# Patient Record
Sex: Female | Born: 1955 | Race: Black or African American | Hispanic: No | Marital: Single | Smoking: Former smoker
Health system: Southern US, Community
[De-identification: ages and names within clinical notes are randomized; demographics above are authoritative.]

## PROBLEM LIST (undated history)

## (undated) DIAGNOSIS — J45909 Unspecified asthma, uncomplicated: Secondary | ICD-10-CM

## (undated) DIAGNOSIS — E119 Type 2 diabetes mellitus without complications: Secondary | ICD-10-CM

## (undated) DIAGNOSIS — I10 Essential (primary) hypertension: Secondary | ICD-10-CM

## (undated) HISTORY — PX: TUBAL LIGATION: SHX77

---

## 2001-02-15 ENCOUNTER — Emergency Department (HOSPITAL_COMMUNITY): Admission: EM | Admit: 2001-02-15 | Discharge: 2001-02-15 | Payer: Self-pay | Admitting: Emergency Medicine

## 2003-07-13 ENCOUNTER — Inpatient Hospital Stay (HOSPITAL_COMMUNITY): Admission: EM | Admit: 2003-07-13 | Discharge: 2003-07-17 | Payer: Self-pay | Admitting: Emergency Medicine

## 2004-08-16 ENCOUNTER — Emergency Department (HOSPITAL_COMMUNITY): Admission: EM | Admit: 2004-08-16 | Discharge: 2004-08-16 | Payer: Self-pay | Admitting: Emergency Medicine

## 2004-11-12 IMAGING — CR DG CHEST 1V PORT
1 series · 1 of 1 positions shown · non-contrast
Comparison: none

CLINICAL DATA: Hyperglycemia, altered level of consciousness.  
PORTABLE CHEST ONE VIEW at 0042 hours:  
  Normal sized heart, clear lungs, minimal scoliosis and moderate inferior acromioclavicular spur formation on the left.
IMPRESSION
No acute abnormality.

[view not recorded]
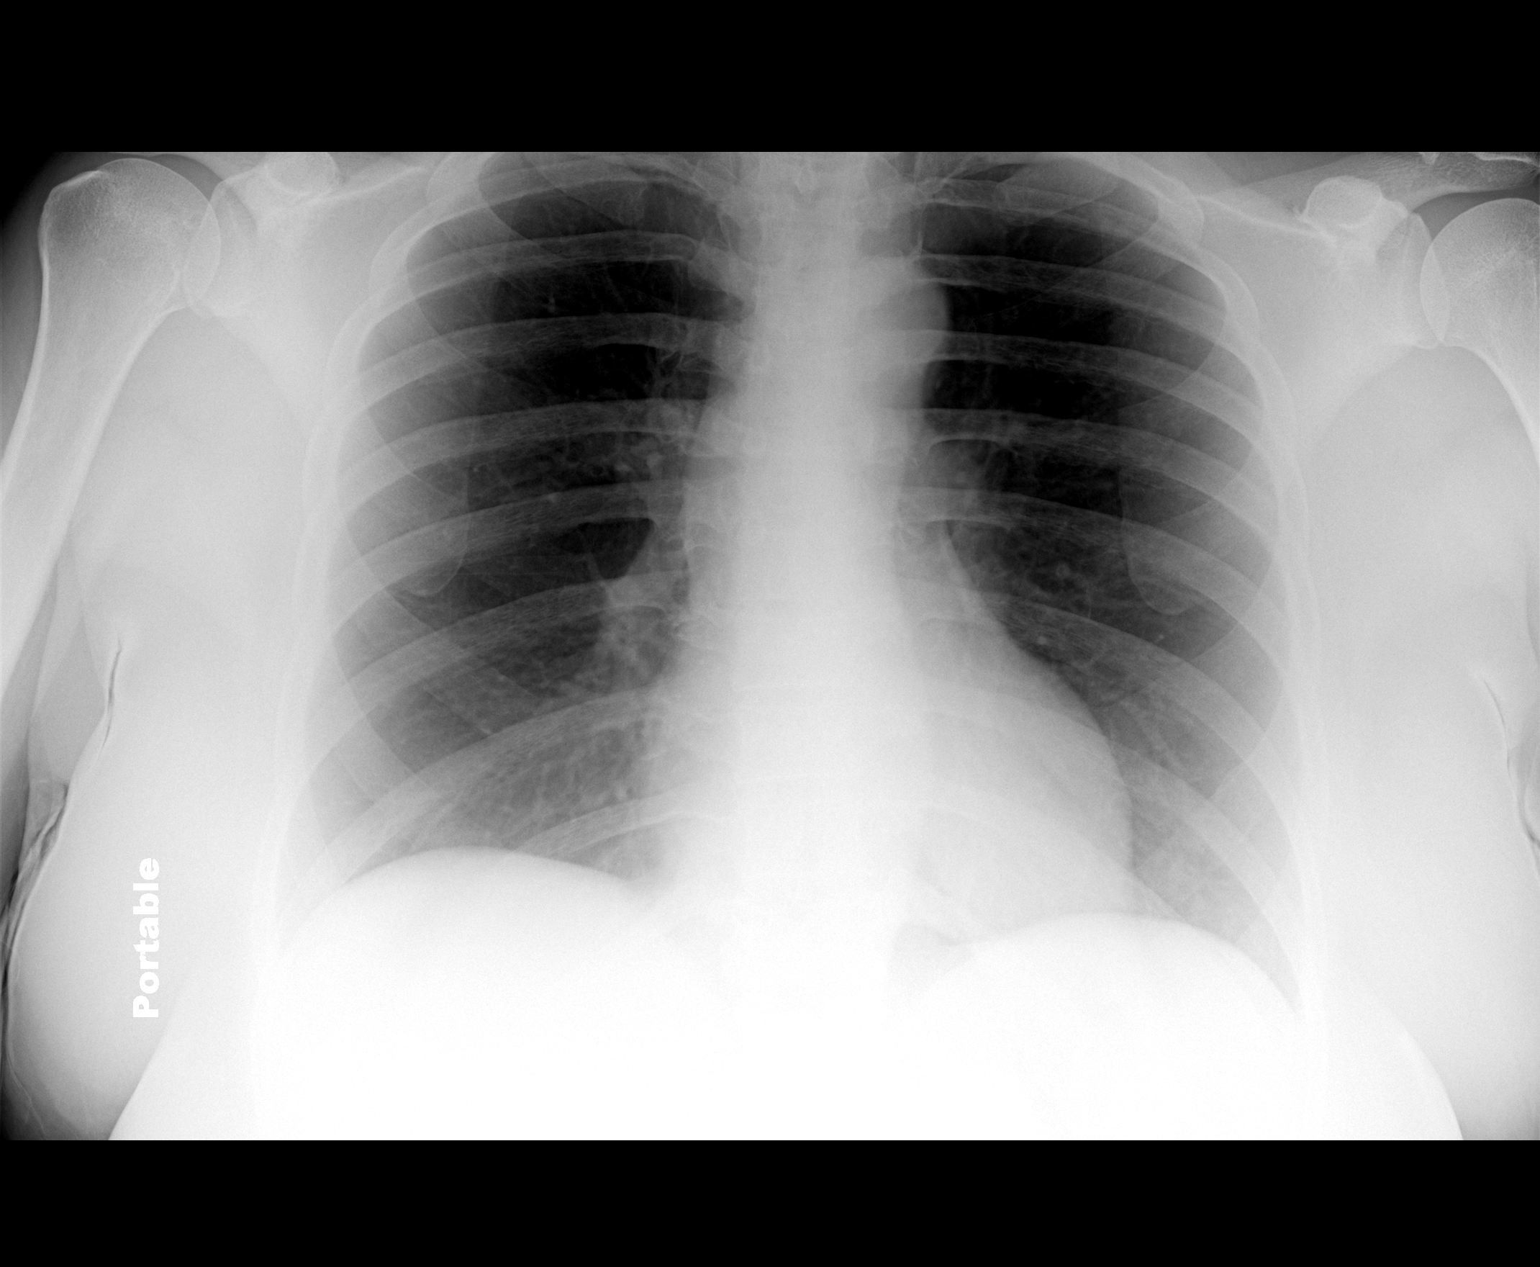

[1 of 1 positions shown; findings below may reference images not displayed]

## 2009-05-10 ENCOUNTER — Other Ambulatory Visit: Admission: RE | Admit: 2009-05-10 | Discharge: 2009-05-10 | Payer: Self-pay | Admitting: Unknown Physician Specialty

## 2010-10-13 NOTE — Discharge Summary (Signed)
NAME:  Lori Gates, Lori Gates                        ACCOUNT NO.:  [id number]   MEDICAL RECORD NO.:  [id number]                   PATIENT TYPE:  INP   LOCATION:  A201                                 FACILITY:  [facility]   PHYSICIAN:  Vania Rea, M.D.              DATE OF BIRTH:  [birth year]   DATE OF ADMISSION:  07/13/2003  DATE OF DISCHARGE:  07/15/2003                                 DISCHARGE SUMMARY   PRIMARY CARE PHYSICIAN:  [facility]   DISCHARGE DIAGNOSES:  1. Diabetes mellitus, uncontrolled.  2. Dehydration with acute renal insufficiency, resolved.  3. Atypical chest pain.  4. Abnormal EKG.  5. History of asthma.  6. Obesity.  7. Hyperlipidemia.   DISPOSITION:  Transferred to [facility] for cardiac  catheterization.   DISCHARGE CONDITION:  Stable.   DISCHARGE MEDICATIONS:  1. Aspirin 81 mg daily.  2. Lopressor 12.5 mg twice daily.  3. Lipitor 20 mg at bedtime.  4. Magnesium oxide 400 mg twice daily.  5. Metoprolol 25 mg twice daily.  6. K-Dur 40 mEq every 4 hours for 3 doses.  7. __________  500 mg p.o. 3 times daily.  8. Novolin 70/30, 35 units q.a.m. 18 units q.p.m.   HOSPITAL COURSE:  Please refer to history and physical of February 15.  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  Eventually she visited a nurse practitioner who prescribes  Glucophage.  The patient did not improve on Glucophage and eventually  returned to the nurse practitioner who advised visiting the emergency room  at [facility].  In the emergency room the patient was noted to be  hyperglycemic with blood sugar over 600.  However, her pH was within the  normal range.  She had ketonemia compatible with her chronic anorexic state.  She was assessed as not  being in ketoacidosis.  She was admitted to  concentrated care and telemetry and treated with vigorous IV fluid hydration  and a moderate dose of insulin with a goal of bringing her blood sugar down  slowly.  By the following day the patient was significantly improved.  She  was eating a normal diet and her blood sugars were now in the 300s.  Her  insulin was increased to bring her blood sugar down into the normal range  and her fasting blood sugar the following morning was 128.   On admission the patient described episodes of chest pain with activity in  going to the bathroom at night, but no chest pain with her normal activity  during the day.  Her cardiac enzymes were slightly elevated, CK and CK/MB  total and the patient had tenderness on the right side  of the chest on  springing the chest.  The pain was considered to be atypical for cardiac  pain, but because of her diabetes and a strong family history of diabetes  and the fact that she had a 3/6 cardiac murmur or unclear etiology a  cardiology consult was sought.   There was no clear reason found for the sudden onset of the patient's  diabetes.  There was no evidence of infection.  There was no evidence of  myocardial infarction by enzymes; and, in fact her hemoglobin A1c was 13.5  which suggested that her blood sugar had been out of control for quite a few  months.   Her fasting lipid profile revealed an LDL of 122.   The cardiology service was concerned that in this obese, African-American  lady with a strong family history of diabetes who was having chest pains on  exertion that unstable angina was a strong diagnosis and cardiac  catheterization was indicated.  She is being transferred to [facility] for a catheterization as soon as possible.  On the morning of  transfer her white count is 6.4. Her hematocrit is 36.1.  Her platelet count  is 234.  Her sodium 132, potassium 3.8, chloride 109, CO2 18, BUN 5,   creatinine 0.9, calcium 8.2, her glucose is 271.  The patient is very  comfortable, free from pain, free from nausea.  She is eating a full 1500  calorie diet as recommended by the dietician.  She is administering her own  glucose and has had diabetic teaching.   Further disposition as per [facility] physicians.     ___________________________________________                                         Vania Rea, M.D.   LC/MEDQ  D:  07/19/2003  T:  07/19/2003  Job:  [id number]

## 2010-10-13 NOTE — H&P (Signed)
NAME:  Lori Gates, Lori Gates                        ACCOUNT NO.:  [id number]   MEDICAL RECORD NO.:  [id number]                   PATIENT TYPE:  INP   LOCATION:  A201                                 FACILITY:  [facility]   PHYSICIAN:  Vania Rea, M.D.              DATE OF BIRTH:  [birth year]   DATE OF ADMISSION:  07/13/2003  DATE OF DISCHARGE:                                HISTORY & PHYSICAL   PRIMARY CARE PHYSICIAN:  Unassigned.   CHIEF COMPLAINT:  High blood sugar.   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  The  patient checked the meaning of this with other workers at the facility and  was reassured that the machine was not calibrated for her.  The patient had  progressive thirst, polyuria, and polydipsia associated with dysuria but  still tended to ignore the symptoms.  The patient also noted chest pain at  night when she got up to go to the bathroom but did not notice chest pain  when involved in activity during the day.  The chest pain at night was  relieved by rest and brought on by exertion.  She did nothing actively to  relieve the pain.  Eventually, about a week ago the patient visited the  public health facility, was found to have elevated blood sugar and started  on Glucophage 500 mg twice daily.  At that time, the patient said she  weighed 221 pounds.  After starting Glucophage, the patient's symptoms of  dizziness on standing, dysuria, polyuria and polydipsia persisted.  The  patient now also started to experience fevers and coughs, nausea and  vomiting and continued to loose weight.  The patient returned to the health  facility today and was sent into the emergency room for admission.   PAST MEDICAL HISTORY:  Significant  for:  1. Asthma.  2. Tubal ligation 25 years ago.   MEDICATIONS:  1. Glucophage 500 mg twice a day for the past week.  2. Albuterol inhaler uses about twice per month.   ALLERGIES:  No known drug allergies.   SOCIAL HISTORY:  Smokes one pack of tobacco per week for the past six years.  Denies the use of alcohol or illicit drugs.  She has one 22 year old  daughter who suffers bronchitis and sinusitis.  She has four siblings all of  whom have hypertension and diabetes.  One of them died as a result of  complications of diabetes, hypertension and CHF.  Her mother died at age 43  from metastatic cancer, was also diabetic.  Her father died at age 61 of  complications of diabetes and cirrhosis of the liver.  She lives on  her own  with her daughter and granddaughter.   REVIEW OF SYSTEMS:  Significant only additionally for headaches since  starting Glucophage.   PHYSICAL EXAMINATION:  GENERAL:  An obese, middle-aged African American lady  lying on the stretcher.  VITAL SIGNS:  Her temperature is 97.6, her pulse is 70, respirations 20,  blood pressure has ranged, since being in the emergency room, from a  systolic in the 90s, currently 122/109.  Her capillary blood sugar, in the  emergency room, is registering over the chart.  She has been started on IV  fluids.  Her height is 5 foot 5 inches, and her weight is 187 pounds.  She  is saturating at 97% on room air.  HEENT:  She is dry.  She is pink and anicteric.  Her pupils are equal and  reactive to light.  There is no lymphadenopathy.  There is no jugular venous  distention.  CHEST:  Clear to auscultation bilaterally.  She has tenderness of the right  side of the chest on springing  the sternum.  CARDIOVASCULAR:  Regular rhythm.  She has a 3/6 systolic murmur in the left  upper sternal border.  There are no gallops.  ABDOMEN:  Obese, soft and nontender.  She has normal abdominal bowel sounds.  EXTREMITIES:  She has no edema and 3+  pulses.  She has a rash on her right  shin which she says is the result of an old injury.  CNS:  She is alert and oriented  x 3.  There are no focal deficits.   LABS:  Her white count is 6.9, her hematocrit is 48.2, hemoglobin is 16.5,  platelet count is 350.  She has 70% neutrophils.  In her CMP, sodium is 126,  potassium 4.2, chloride 88, CO2 22 and glucose 622.  BUN is 24, creatinine  1.8.  Liver related enzymes are all normal.  Her total protein is 8.5,  albumin 4.3, calcium 9.9.  Serum osmolality is 326, slightly elevated.  Urinalysis shows over 1,000 glucose and over 80 ketones.  Specific gravity  is 1.003, pH is 5.  It is otherwise negative.  Her ABG is significant for a  pH of 7.35, pCO2 33, pO2 90, saturation 97.5% on room air.  The first set of  cardiac enzymes CK total is 301, CK-MB 4.2, and the troponin is 0.03.   EKG shows normal sinus rhythm with AVL being over being over 11 mm, probably  left ventricular hypertrophy with T wave inversions in V3-V6 but not deep T  wave inversions.  No other ST segment changes suggestive of ischemia.   Chest x-ray shows no acute abnormality.   ASSESSMENT:  1. Newly diagnosed diabetes mellitus.  2. Dehydration with acute renal insufficiency.  3. Unstable angina.  4. History of asthma.   PLAN:  Since the patient does not seem to be in DKA but merely has very high  blood glucose, we will admit her.  We will hydrate her vigorously.  We will  start her on twice daily 70/30 Novolin as well as sliding scale insulin for  the next 24 hours.  We will continue to do serial cardiac enzymes and EKGs.  We will culture her blood and urine.  We will check her TSH and lipid panel.  We will institute diabetic teaching, teach her how to give herself insulin.  Get an echo of the heart because of the cardiac murmurs.  Assess for  valvular disease and get a cardiology consult.  We are expecting this admission to be no more than about three days when her  blood sugar is in a  normal range, we will send her home on insulin and assign her to a primary  care physician who may be able to arrange for ophthalmologic followup,  podiatry followup, cardiology followup, and may transition her to oral  medication if indicated.     ___________________________________________                                         Vania Rea, M.D.   LC/MEDQ  D:  07/13/2003  T:  07/13/2003  Job:  [id number]

## 2010-10-13 NOTE — Consult Note (Signed)
NAME:  Lori Gates, Lori Gates                        ACCOUNT NO.:  [id number]   MEDICAL RECORD NO.:  [id number]                   PATIENT TYPE:  INP   LOCATION:  A201                                 FACILITY:  [facility]   PHYSICIAN:  Vida Roller, M.D.                DATE OF BIRTH:  [birth year]   DATE OF CONSULTATION:  07/14/2003  DATE OF DISCHARGE:                                   CONSULTATION   PRIMARY CARE Lori Gates:  Vertis Kelch at the health department at [facility].   CARDIOLOGIST:  None.   HISTORY OF PRESENT ILLNESS:  Lori Gates is a 55 year old female with no  known coronary artery disease and a past medical history only for asthma who  was found approximately a week ago to have new-onset diabetes.  She  presented to her primary care Lori Gates with polyuria, polydipsia, elevated  glucose, was started on Glucophage at that time, and reported progressive  worsening symptoms with nausea, vomiting, chest pain, shortness of breath,  dyspnea on exertion over the course of the next week, and was admitted last  night with significant acid-base disturbance and hyperglycemia.  The patient  reports that she has had left anterior chest tightness with exertion and  associated shortness of breath for about a week-and-a-half to 2 weeks.  She  states that whenever she exerts herself she has discomfort in the left  center of her chest which is associated with a squeezing sensation and  shortness of breath.  When she tries to walk through the discomfort it gets  worse; when she stops it gets better.  She denies any PND or orthopnea and  there is no obvious lower extremity edema.  She has had no palpitations, no  diaphoresis.  Currently she is without chest discomfort and has not had  chest discomfort for about 12 hours.   PAST MEDICAL HISTORY:  Significant for asthma for which she takes albuterol  nebulizers on a p.r.n. basis and no other medication.  She had a bilateral  tubal ligation about  25 years ago, and the diabetes.   MEDICATIONS:  Medications prior to admission are Glucophage 500 b.i.d. which  she has taken for about a week, and the albuterol nebulizers p.r.n.  She is  not allergic to any medications.   Here in the hospital she is on:  1. Diflucan 100 mg p.o. daily.  2. Insulin 70/30 she gets 30 units in the a.m. and 15 units in the p.m.     subcu.  3. She is also on a sliding scale regular insulin.  4. She is getting IV normal saline with potassium chloride at 100 mL/hour.  5. Albuterol and Atrovent nebulizers on a p.r.n. basis for her shortness of     breath.   SOCIAL HISTORY:  She lives in [place] with her daughter and her  grandchildren.  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  She is  divorced.  She has one 31 year old daughter who is healthy, two  grandchildren who are healthy.  She has got about a 6 pack-year smoking  history.  She has been smoking for quite a few years.  She smokes about a  pack a week due to financial situation.  She does not use any alcohol and  there are no illicit drugs.   FAMILY HISTORY:  Her mother died at age 57 of diabetes and metastatic  cancer.  Her father died at age 36 of diabetes and cirrhosis.  She has one  sister who died at a relatively early age of diabetes, hypertension, and  heart failure.  She has one sister who has hypertension and diabetes.   REVIEW OF SYSTEMS:  She has subjective fevers though no obvious findings.  She has significant blurred vision, significant vertigo with shortness of  breath and dizziness when she stands.  This has persisted during her  hospitalization.  She has the chest pain and dyspnea on exertion previously  described.  She tells me she has a vaginal discharge which is bothersome to  her which is why she is, I believe, on the Diflucan, but no dysuria.  She  has had persistent nausea and vomiting over the course of last week, some  constipation, and the polydipsia and  polyuria as previously described.  The  remainder of her review of systems is negative.   PHYSICAL EXAMINATION:  GENERAL:  She is a well-developed, moderately-obese  African-American female who is really in mild distress secondary to her  dizziness and shortness of breath.  VITAL SIGNS:  Her temperature is 96.5, her pulse is 68, her respiratory rate  is 20, her blood pressure is 125/82, and her most recent blood glucose is  394 - fingerstick.  HEENT:  Unremarkable.  She does have relatively poor dentition.  She has no  jugular venous distention or carotid bruits noted.  CARDIOVASCULAR:  She has a nondisplaced point of maximal impulse.  First and  second heart sounds are normal.  There is a 2/6 systolic murmur that is  heard best at the upper right sternal border.  She has no diastolic murmurs  and no extra sounds.  LUNGS:  She has decreased breath sounds at the bases but otherwise  reasonably good air movement.  BREASTS:  Her complete breast exam was deferred.  SKIN:  Without any significant lesions.  ABDOMEN:  Soft, nontender.  She has normal active bowel sounds.  No  hepatosplenomegaly noted although her abdomen is obese.  EXTREMITIES:  Without clubbing, cyanosis, or edema.  GENITOURINARY AND RECTAL:  Deferred.  MUSCULOSKELETAL:  Otherwise normal.  NEUROLOGIC:  Generally intact.  I did not do a full neurologic exam.   Her chest x-ray shows no acute abnormality.  Her electrocardiogram is  concerning.  She has sinus rhythm at a rate of 70 with a mild left axis  deviation.  Her intervals are all normal.  She does not have any obvious Q  waves but she has inverted T waves in leads V4-V6 and II, III, and aVF with  minimal voltage criteria for left ventricular hypertrophy.  Laboratories:  Her white blood cell count is 6.9 with H&H of 16 and 48, platelet count of  350.  Her sodium is 130, potassium is 5.5, her chloride is 100 with a bicarbonate of 23, BUN of 16, creatinine of 1.0, her  blood sugar is 423 this  morning.  Liver function  studies are all within normal limits with the  exception of a total protein of 8.5 and albumin of 4.3 - both of which are  elevated.  She has three sets of cardiac enzymes which are not consistent  with acute myocardial infarction.  She has moderate acetone in her blood.  Her serum osmolality is 326.  Her urinalysis shows a pH of 5.0 with a  specific gravity of 1.003 with 3-6 white blood cells.  She has greater than  1000 glucose in her urine and greater than 80 ketones in her urine.  Her  blood gas shows a pH of 7.35, PCO2 of 33, PO2 of 90, with a calculated  bicarbonate of 17, 97% saturation.   So, this is a lady with a relatively significant diabetic acid-base  disturbance which I will defer management to Dr. Orvan Falconer after a lengthy  discussion regarding this.  She does have an acute coronary syndrome.  Her  enzymes are negative x3 and her symptoms have pretty much resolved, but her  EKG is abnormal and I believe with her relatively impressive presentation  that she probably benefits from an invasive assessment.  We will get an  echocardiogram today and her invasive assessment probably needs to be done  once her acid-base status is more stabilized.  With the serum osmolality  elevated and the evidence of relatively significant acidosis - ketoacidosis  in my opinion - it would probably not be in her best interests to undergo an  invasive assessment with the potential catastrophic complications associated  with that at this point when she is relatively stable.  I would recommend, I  think, addition of a low dose of aspirin and a beta blocker in her  situation.  Those would be very reasonable things.  She will need to be  placed on telemetry as she was not over the course of the last 24 hours.  I  agree with replacing her potassium.  I would also check a magnesium level to  ensure that that is not significantly depleted as that is  associated with  some cardiac arrhythmias.  Will follow her along relatively aggressively  here in the hospital.  If she develops any more discomfort in her chest I  would recommend that she be anticoagulated with unfractionated heparin,  serious consideration be made for using a IIb/IIIa inhibitor, that she be  relatively aggressively managed for her acid-base status, and potentially  moved emergently to [facility] for an invasive procedure.      ___________________________________________                                            Vida Roller, M.D.   JH/MEDQ  D:  07/14/2003  T:  07/14/2003  Job:  [id number]

## 2010-10-13 NOTE — Discharge Summary (Signed)
NAME:  Lori Gates, Lori Gates                        ACCOUNT NO.:  [id number]   MEDICAL RECORD NO.:  [id number]                   PATIENT TYPE:  INP   LOCATION:  4736                                 FACILITY:  [facility]   PHYSICIAN:  Olga Millers, M.D.                DATE OF BIRTH:  Oct 12, 1955   DATE OF ADMISSION:  07/15/2003  DATE OF DISCHARGE:  07/17/2003                                 DISCHARGE SUMMARY   HISTORY OF PRESENT ILLNESS:  This is a 55 year old black female with no  known coronary artery disease and past medical history for asthma and new  onset diabetes, reported worsening symptoms of nausea, vomiting, chest pain,  and dyspnea on exertion. She reported left anterior chest tightness with  exertion and with associated shortness of breath.  The pain was relieved  with rest.  The patient reports increased chest pain over the last week.  She was admitted to [facility] to evaluate the above symptoms.   PAST MEDICAL HISTORY:  1. Diabetes diagnosed approximately one week ago.  The patient relates blood     sugars were between 400 and 500 levels. She was on p.o. Glucophage for     this>  2. History of asthma.  3. Status post tubal ligation 25 years ago.   ALLERGIES:  No known drug allergies.   SOCIAL HISTORY:  The patient lives in [place].  She is divorced x 2.  Lives with her daughter and grandchildren.  She is a Lawyer in [place].  She  denies history of alcohol use. She does smoke approximately one pack per day  for the past two years.  She denies any exercise.   FAMILY HISTORY:  Mother deceased at 56, diabetes, secondary to metastatic  cancer.  Father deceased at age 58, diabetes and cirrhosis.  Siblings  positive history of diabetes and hypertension.   HOSPITAL COURSE:  As noted, the patient was admitted to [facility] for  further evaluation of her chest pain.  She was transported here from  [place].  While in [facility], initial CK was slightly elevated at 301,  CK-MB 4.1,  troponin negative.  Urine was negative.  CMET showed a blood  glucose of 6.2.  CBC was unremarkable.  She decided to be transferred to  [facility].  While at [facility], echocardiogram was checked which showed  left ventricle normal size, concentric left ventricular hypertrophy,  70  to  55% EF.  Valves were unremarkable.  It was decided she would be transferred  to [facility] for cardiac catheterization.   Cardiac catheterization on July 16, 2003, per Dr. Riley Kill showed normal  EF, no obstructive coronary artery disease.  Chest pain was not related to  coronary artery disease.  D-dimer was added which was negative.  It was  decided she would be sent home on insulin and to follow up with [facility] Department until she gets primary care  and good followup.   LABORATORY DATA:  CBC on discharge showed a white count of 6.2, hemoglobin  12.4, hematocrit 36.0, platelets 249.  Sodium 135, potassium 4.0, chloride4  101, CO2 29, glucose 236, BUN 5, creatinine 1.0.  Calcium was 8.5.  Thyroid  was checked and was 2.304.  Cholesterol showed 202 total, triglycerides 260,  HDL 28, LDL 122.   Chest x-ray showed no acute abnormalities.   Blood gas showed a pH of 7.35, PCO2 low at 33, bicarb low at 17.7, PO2 was  90.  __________ was 6.4.  PTT 30, INR 0.9, pro time 12.3.  D-dimer was 0.36.  Her A1C was 13.5.   DISCHARGE MEDICATIONS:  1. The patient was told to begin enteric-coated aspirin 81 mg 1 p.o. daily.  2. She was to maintain on Humulin 70/30, 35 units q.a.m., 18 q.p.m.  The     patient was given a viol at the hospital.  3. She is to follow up with [facility] Department for probable     addition of an ACE inhibitor.   Stressed to patient the importance of blood sugar control.   Advised patient that cardiovascular risk is linked to uncontrolled diabetes.  She is in agreement.  She does have an appointment with [facility]  Department at 8:45 on Tuesday, July 20, 2003.   She was advised no  driving x 2 days, check blood sugars twice daily and take to appointment,  monitor the groin site for swelling and pain.  She is to follow a low-sugar,  low-fat diet and use Tylenol p.r.n..   DISCHARGE DIAGNOSES:  1. Admission secondary to chest pain.  No coronary artery disease per     catheterization July 16, 2003.  2. Diabetes, poor control.  A1C 13.5, initiated on insulin.  3. Asthma.      [place], [place]                       Olga Millers, M.D.    MP/MEDQ  D:  07/17/2003  T:  07/18/2003  Job:  [id number]

## 2010-10-13 NOTE — Cardiovascular Report (Signed)
NAME:  Lori Gates, Lori Gates                        ACCOUNT NO.:  [id number]   MEDICAL RECORD NO.:  [id number]                   PATIENT TYPE:  INP   LOCATION:  4736                                 FACILITY:  [facility]   PHYSICIAN:  Arturo Morton. Riley Kill, M.D.             DATE OF BIRTH:  [birth year]   DATE OF PROCEDURE:  07/16/2003  DATE OF DISCHARGE:                              CARDIAC CATHETERIZATION   INDICATIONS:  Ms. Nowland is a 55 year old woman who presented with out of  control diabetes.  She also had fairly impressive chest pain.  There were T-  wave inversions in the anterolateral leads.  The current was done to assess  coronary anatomy.   PROCEDURES:  1. Left heart catheterization.  2. Selective coronary arteriography.  3. Selective left ventriculography.   DESCRIPTION OF PROCEDURE:  The procedure was performed from the right  femoral artery using 6 French catheters.  She tolerated the procedure  without complication and was taken to the holding area in satisfactory  clinical condition.  Informed consent had been provided by Dr. Dionicio Stall.   HEMODYNAMIC DATA:  1. Central aorta:  114/79, mean 96.  2. Left ventricle:  115/13.  3. No gradient on pullback across the aortic valve.   ANGIOGRAPHIC DATA:  1. Ventriculography was performed in the RAO projection.  Overall systolic     function was well preserved.  No definite wall motion abnormalities were     seen.  Ejection fraction was in excess of 55%.  2. The left main coronary was long and without significant focal narrowing.  3. The left anterior descending artery coursed in the apex and provided the     blood supply to most of the inferior wall.  There was major diagonal     branch which bifurcated as well.  The LAD and diagonal were free of     critical disease.  4. There is a small ramus intermedius that is free of critical disease.  5. There is an AV circumflex that provides two major marginal branches and     is free of  disease.  6. The right coronary artery is a smaller caliber vessel that provides a     tiny PDA and posterior lateral system. No obvious high grade areas of     focal abnormality are seen.   CONCLUSIONS:  1. Well preserved left ventricular function.  2. No definite coronary abnormalities.   PLAN:  1. Diabetes coordinator.  2. Check D-dimer.  Consider CT scan.  3. The patient will need primary care which may be difficult to arrange.                                               Arturo Morton. Riley Kill, M.D.    TDS/MEDQ  D:  07/16/2003  T:  07/16/2003  Job:  [id number]   cc:   Vida Roller, M.D.  Fax: [phone]

## 2010-10-13 NOTE — Procedures (Signed)
NAME:  Lori Gates, Lori Gates                        ACCOUNT NO.:  [id number]   MEDICAL RECORD NO.:  [id number]                   PATIENT TYPE:  INP   LOCATION:  A201                                 FACILITY:  [facility]   PHYSICIAN:  Vida Roller, M.D.                DATE OF BIRTH:  07-21-1955   DATE OF PROCEDURE:  07/14/2003  DATE OF DISCHARGE:                                  ECHOCARDIOGRAM   TAPE NUMBER:  LB [id number], tape count 952 through 1441.   INDICATIONS:  This is a 55 year old woman with diabetic ketoacidosis and  unstable angina, with an abnormal electrocardiogram for left ventricular  systolic function.   TECHNICAL QUALITY:  The technical quality of the study is good.   M-MODE TRACINGS:  1. The aorta is 30 mm.  2. The left atrium is 34 mm.  3. The septum is 12 mm.  4. The posterior wall is 12 mm.  5. The left ventricular diastolic dimension is 35 mm.  6. The left ventricular systolic dimension is 18.   2-D AND DOPPLER IMAGING:  1. The left ventricle is normal size with mild, concentric left ventricular     hypertrophy.  The systolic function is hyperdynamic with an estimated     ejection fraction of 70 to 75%.  There were no obvious wall motion     abnormalities seen.  Diastolic function was assessed and appears to be     normal.  2. The right ventricle is top normal in size, but otherwise shows normal     systolic function.  No wall motion abnormalities are seen.  3. Both atria appear to be normal size.  4. The aortic valve is mildly sclerotic with no stenosis or regurgitation.  5. The mitral valve is morphologically unremarkable with no stenosis or     regurgitation.  6. The tricuspid valve is morphologically unremarkable with trivial     insufficiency.  No stenosis is seen.  7. The pulmonic valve appears to be morphologically unremarkable with     trivial insufficiency.  No stenosis is seen.  8. Pericardial strictures appear normal.  9. The inferior vena cava was not well  seen.  10.      The ascending aorta was not well seen.      ___________________________________________                                            Vida Roller, M.D.   JH/MEDQ  D:  07/14/2003  T:  07/15/2003  Job:  [id number]

## 2012-05-06 ENCOUNTER — Encounter (HOSPITAL_COMMUNITY): Payer: Self-pay | Admitting: *Deleted

## 2012-05-06 ENCOUNTER — Emergency Department (HOSPITAL_COMMUNITY)
Admission: EM | Admit: 2012-05-06 | Discharge: 2012-05-06 | Disposition: A | Discharge status: Home / Self Care | Payer: Self-pay | Attending: Emergency Medicine | Admitting: Emergency Medicine

## 2012-05-06 ENCOUNTER — Emergency Department (HOSPITAL_COMMUNITY): Payer: Self-pay

## 2012-05-06 DIAGNOSIS — J029 Acute pharyngitis, unspecified: Secondary | ICD-10-CM | POA: Insufficient documentation

## 2012-05-06 DIAGNOSIS — J45909 Unspecified asthma, uncomplicated: Secondary | ICD-10-CM | POA: Insufficient documentation

## 2012-05-06 DIAGNOSIS — R059 Cough, unspecified: Secondary | ICD-10-CM | POA: Insufficient documentation

## 2012-05-06 DIAGNOSIS — I10 Essential (primary) hypertension: Secondary | ICD-10-CM | POA: Insufficient documentation

## 2012-05-06 DIAGNOSIS — R6883 Chills (without fever): Secondary | ICD-10-CM | POA: Insufficient documentation

## 2012-05-06 DIAGNOSIS — E876 Hypokalemia: Secondary | ICD-10-CM | POA: Insufficient documentation

## 2012-05-06 DIAGNOSIS — R112 Nausea with vomiting, unspecified: Secondary | ICD-10-CM | POA: Insufficient documentation

## 2012-05-06 DIAGNOSIS — E119 Type 2 diabetes mellitus without complications: Secondary | ICD-10-CM | POA: Insufficient documentation

## 2012-05-06 DIAGNOSIS — J3489 Other specified disorders of nose and nasal sinuses: Secondary | ICD-10-CM | POA: Insufficient documentation

## 2012-05-06 DIAGNOSIS — R05 Cough: Secondary | ICD-10-CM | POA: Insufficient documentation

## 2012-05-06 DIAGNOSIS — B9789 Other viral agents as the cause of diseases classified elsewhere: Secondary | ICD-10-CM | POA: Insufficient documentation

## 2012-05-06 DIAGNOSIS — B349 Viral infection, unspecified: Secondary | ICD-10-CM

## 2012-05-06 HISTORY — DX: Unspecified asthma, uncomplicated: J45.909

## 2012-05-06 HISTORY — DX: Type 2 diabetes mellitus without complications: E11.9

## 2012-05-06 HISTORY — DX: Essential (primary) hypertension: I10

## 2012-05-06 LAB — CBC WITH DIFFERENTIAL/PLATELET
Eosinophils Relative: 1 % (ref 0–5)
Lymphocytes Relative: 42 % (ref 12–46)
Lymphs Abs: 3.6 10*3/uL (ref 0.7–4.0)
MCV: 87.4 fL (ref 78.0–100.0)
Platelets: 301 10*3/uL (ref 150–400)
RBC: 4.83 MIL/uL (ref 3.87–5.11)
WBC: 8.6 10*3/uL (ref 4.0–10.5)

## 2012-05-06 LAB — COMPREHENSIVE METABOLIC PANEL
ALT: 9 U/L (ref 0–35)
Alkaline Phosphatase: 76 U/L (ref 39–117)
CO2: 26 mEq/L (ref 19–32)
Calcium: 10 mg/dL (ref 8.4–10.5)
GFR calc Af Amer: 72 mL/min — ABNORMAL LOW (ref >90)
GFR calc non Af Amer: 63 mL/min — ABNORMAL LOW (ref >90)
Glucose, Bld: 105 mg/dL — ABNORMAL HIGH (ref 70–99)
Potassium: 3.1 mEq/L — ABNORMAL LOW (ref 3.5–5.1)
Sodium: 138 mEq/L (ref 135–145)
Total Bilirubin: 0.4 mg/dL (ref 0.3–1.2)

## 2012-05-06 MED ORDER — ONDANSETRON HCL 4 MG PO TABS: 4.0000 mg | ORAL_TABLET | Freq: Three times a day (TID) | ORAL | Status: AC | PRN

## 2012-05-06 MED ORDER — OSELTAMIVIR PHOSPHATE 75 MG PO CAPS: 75.0000 mg | ORAL_CAPSULE | Freq: Two times a day (BID) | ORAL | Status: DC

## 2012-05-06 MED ORDER — ONDANSETRON HCL 4 MG/2ML IJ SOLN
4.0000 mg | Freq: Once | INTRAMUSCULAR | Status: AC
  Administered 2012-05-06: 4 mg via INTRAVENOUS
  Filled 2012-05-06: qty 2

## 2012-05-06 MED ORDER — POTASSIUM CHLORIDE 20 MEQ/15ML (10%) PO LIQD
40.0000 meq | Freq: Every day | ORAL | Status: DC
  Administered 2012-05-06: 40 meq via ORAL
  Filled 2012-05-06: qty 30

## 2012-05-06 MED ORDER — SODIUM CHLORIDE 0.9 % IV BOLUS (SEPSIS)
1000.0000 mL | Freq: Once | INTRAVENOUS | Status: AC
  Administered 2012-05-06: 1000 mL via INTRAVENOUS

## 2012-05-06 MED ORDER — OSELTAMIVIR PHOSPHATE 75 MG PO CAPS
75.0000 mg | ORAL_CAPSULE | Freq: Once | ORAL | Status: AC
  Administered 2012-05-06: 75 mg via ORAL
  Filled 2012-05-06: qty 1

## 2012-05-06 MED ORDER — FAMOTIDINE IN NACL 20-0.9 MG/50ML-% IV SOLN
20.0000 mg | Freq: Once | INTRAVENOUS | Status: AC
  Administered 2012-05-06: 20 mg via INTRAVENOUS
  Filled 2012-05-06: qty 50

## 2012-05-06 NOTE — ED Provider Notes (Signed)
History     CSN: [id number]  Arrival date & time 05/06/12  1845   First MD Initiated Contact with Patient 05/06/12 2021      Chief Complaint  Patient presents with  . URI  . N/V     (Consider location/radiation/quality/duration/timing/severity/associated sxs/prior treatment) HPI  Lori Gates is a 56 y.o. female with past medical history significant for diabetes, hypertension and asthma complaining of nausea/vomiting, sore throat, cough and congestion with chills x1day. Denies change in bowel or bladder habits, chest pain, shortness of breath, abdominal pain.   Past Medical History  Diagnosis Date  . Diabetes mellitus without complication   . Hypertension   . Asthma     History reviewed. No pertinent past surgical history.  History reviewed. No pertinent family history.  History  Substance Use Topics  . Smoking status: Not on file  . Smokeless tobacco: Not on file  . Alcohol Use:     OB History    Grav Para Term Preterm Abortions TAB SAB Ect Mult Living                  Review of Systems  Constitutional: Positive for chills. Negative for fever.  HENT: Positive for congestion and sore throat.   Respiratory: Positive for cough. Negative for shortness of breath.   Cardiovascular: Negative for chest pain.  Gastrointestinal: Positive for nausea and vomiting. Negative for abdominal pain and diarrhea.  All other systems reviewed and are negative.    Allergies  Review of patient's allergies indicates no known allergies.  Home Medications  No current outpatient prescriptions on file.  BP 140/85  Pulse 64  Temp 98.7 F (37.1 C) (Oral)  Resp 20  SpO2 100%  Physical Exam  Nursing note and vitals reviewed. Constitutional: She is oriented to person, place, and time. She appears well-developed and well-nourished. No distress.  HENT:  Head: Normocephalic.  Mouth/Throat: Oropharynx is clear and moist.  Eyes: Conjunctivae normal and EOM are normal. Pupils  are equal, round, and reactive to light.  Neck: Normal range of motion.  Cardiovascular: Normal rate, regular rhythm and intact distal pulses.   Pulmonary/Chest: Effort normal and breath sounds normal. No stridor. No respiratory distress. She has no wheezes. She has no rales. She exhibits no tenderness.  Abdominal: Soft. Bowel sounds are normal. She exhibits no distension and no mass. There is no tenderness. There is no rebound and no guarding.  Musculoskeletal: Normal range of motion.  Neurological: She is alert and oriented to person, place, and time.  Psychiatric: She has a normal mood and affect.    ED Course  Procedures (including critical care time)  Labs Reviewed  COMPREHENSIVE METABOLIC PANEL - Abnormal; Notable for the following:    Potassium 3.1 (*)     Glucose, Bld 105 (*)     GFR calc non Af Amer 63 (*)     GFR calc Af Amer 72 (*)     All other components within normal limits  CBC WITH DIFFERENTIAL  LIPASE, BLOOD   Dg Chest 2 View  05/06/2012  *RADIOLOGY REPORT*  Clinical Data: Dyspnea, wheezing, history asthma, hypertension, diabetes  CHEST - 2 VIEW  Comparison: 07/13/2003  Findings: Normal heart size and pulmonary vascularity. Calcified tortuous aorta. Lungs clear. No pleural effusion or pneumothorax. Bones unremarkable.  IMPRESSION: No acute abnormalities.   Original Report Authenticated By: Ulyses Southward, M.D.    10:16 PM patient is tolerating by mouth and feels much better.  1. Hypokalemia  2. Viral syndrome       MDM  Insulin-dependent diabetic asthmatic with flulike symptoms starting 24 hours ago. Plan is to hydrate, control nausea and start her on Tamiflu.   Pt verbalized understanding and agrees with care plan. Outpatient follow-up and return precautions given.    New Prescriptions   ONDANSETRON (ZOFRAN) 4 MG TABLET    Take 1 tablet (4 mg total) by mouth every 8 (eight) hours as needed for nausea.   OSELTAMIVIR (TAMIFLU) 75 MG CAPSULE    Take 1 capsule (75  mg total) by mouth every 12 (twelve) hours.          Wynetta Emery, PA-C 05/06/12 2217

## 2012-05-06 NOTE — ED Notes (Signed)
Pt in c/o sore throat, cough and congestion, also n/v x1 day. Pt unsure of fever at home. Pt speaking in full sentences, no distress noted.

## 2012-05-07 NOTE — ED Provider Notes (Signed)
Medical screening examination/treatment/procedure(s) were performed by non-physician practitioner and as supervising physician I was immediately available for consultation/collaboration.  Totiana Everson T Mauriah Mcmillen, MD 05/07/12 1558 

## 2013-09-06 IMAGING — CR DG CHEST 2V
2 series · 2 of 2 positions shown · non-contrast
Comparison: 07/13/2003

CLINICAL DATA: Dyspnea, wheezing, history asthma, hypertension,
diabetes

CHEST - 2 VIEW

[w chest pa]
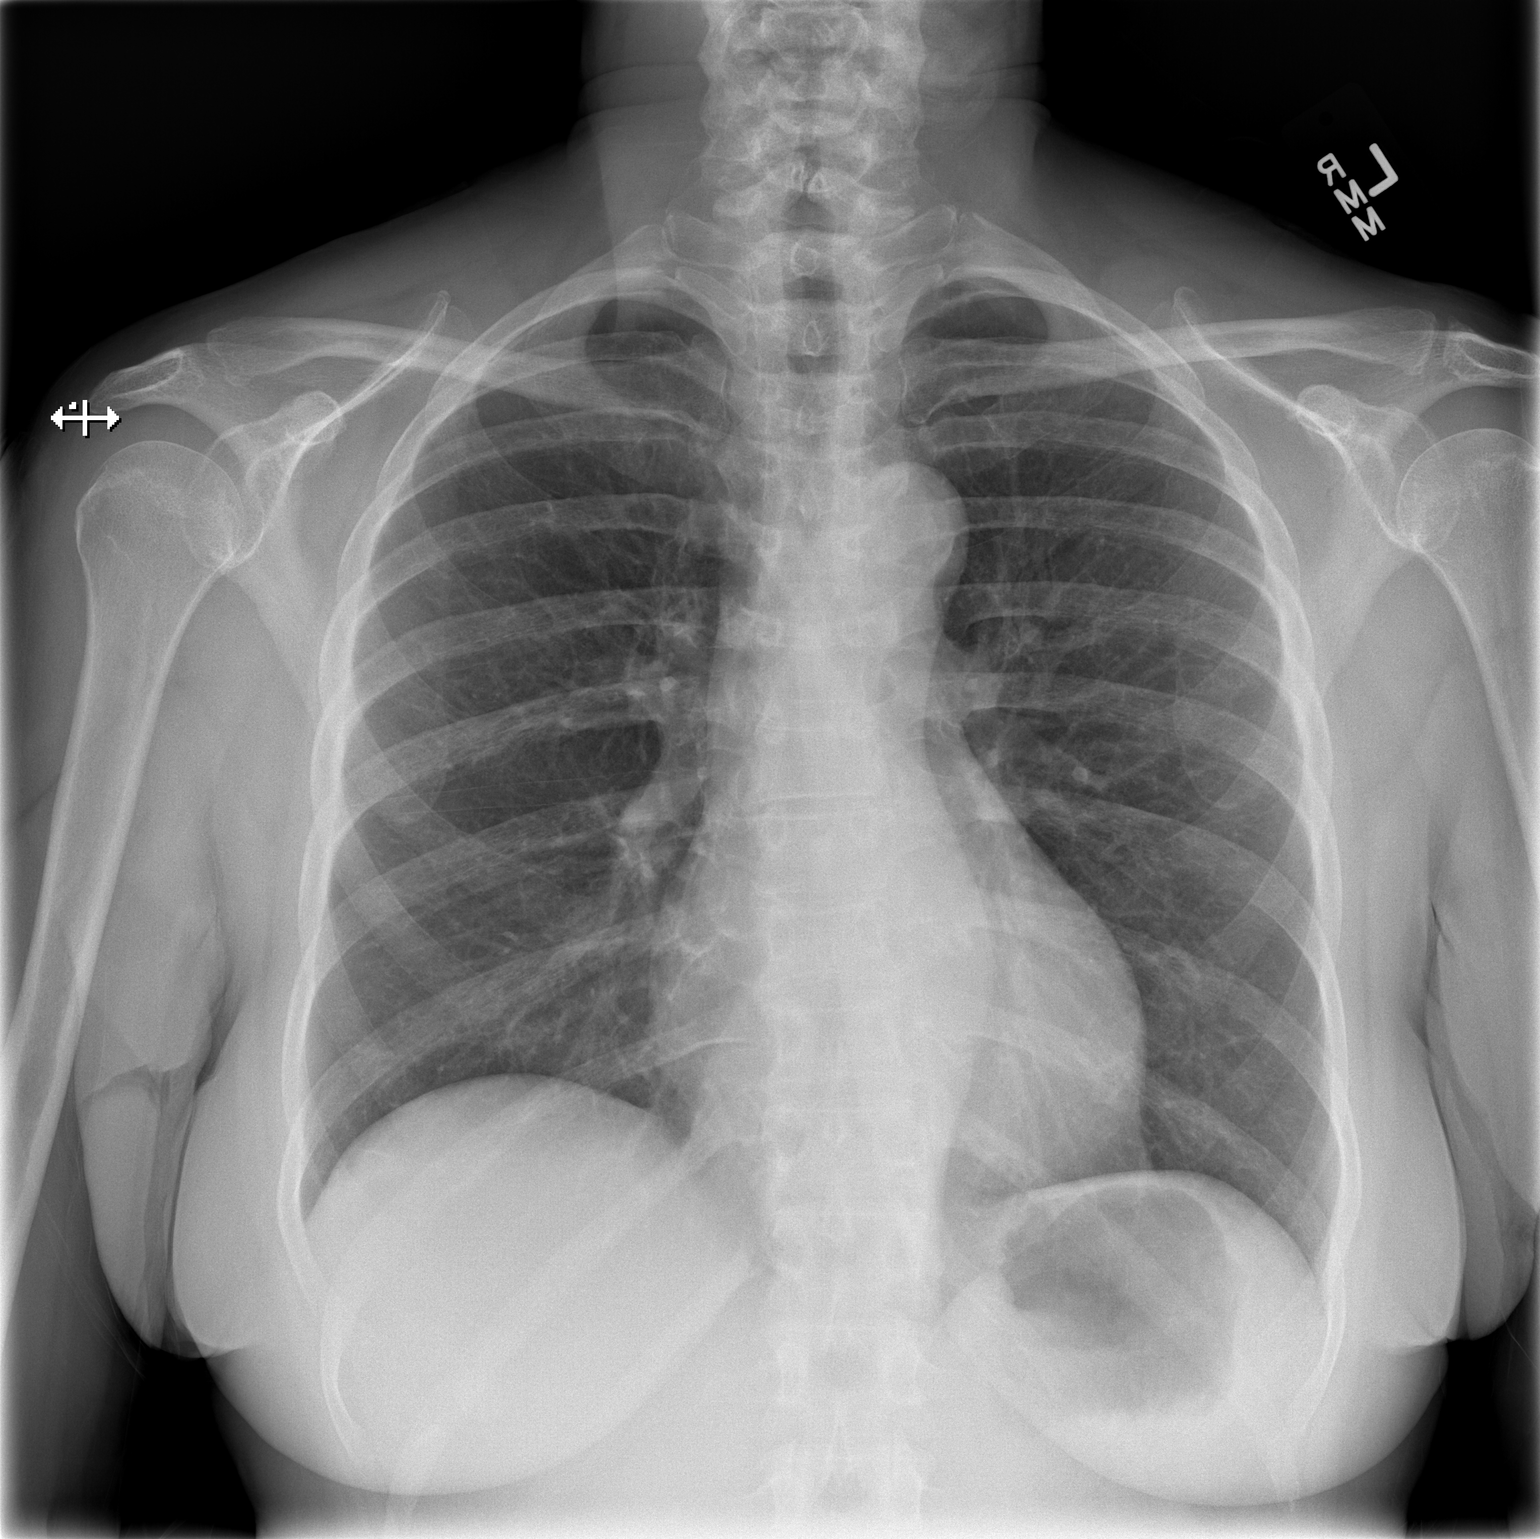

[w chest lat]
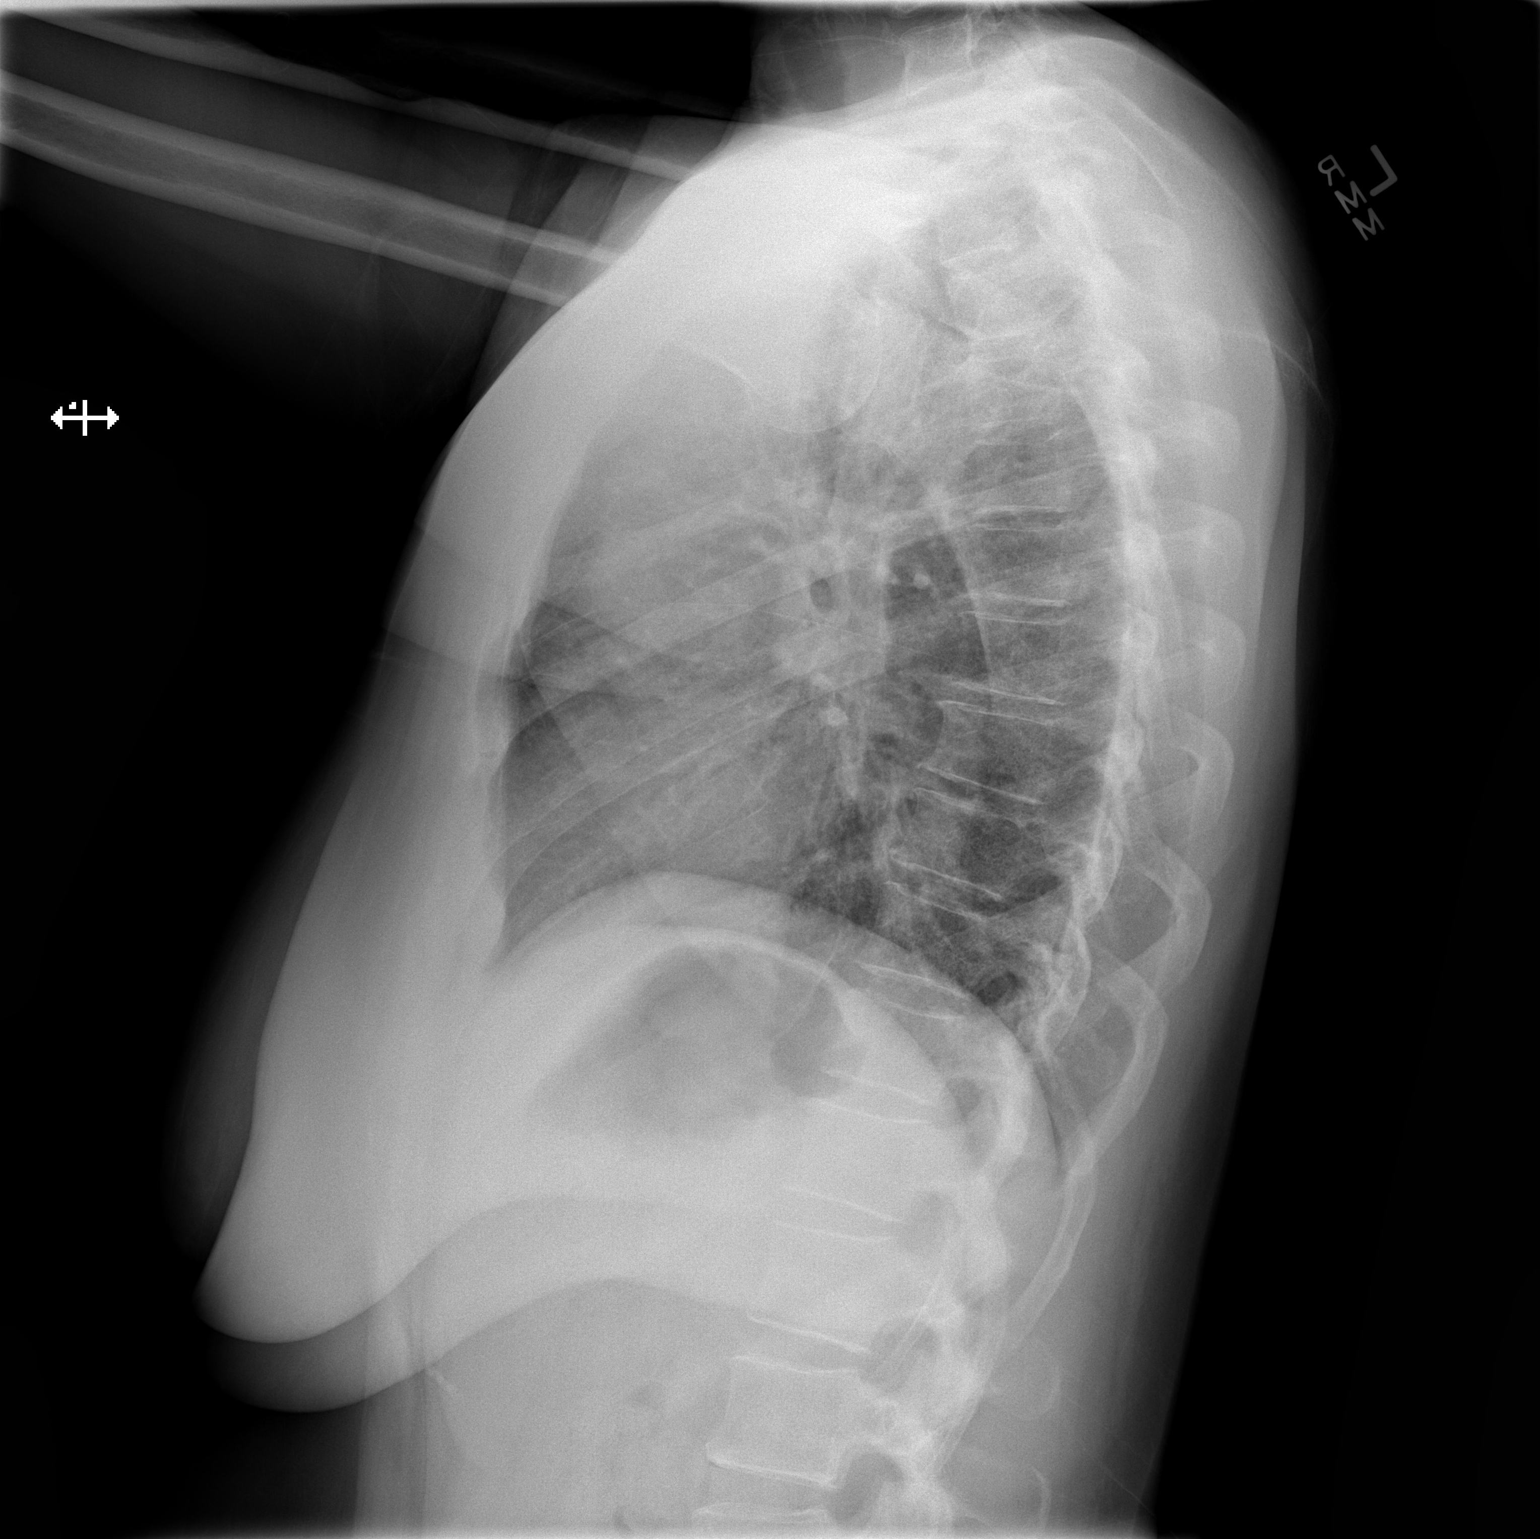

[2 of 2 positions shown; findings below may reference images not displayed]

FINDINGS: Normal heart size and pulmonary vascularity.
Calcified tortuous aorta.
Lungs clear.
No pleural effusion or pneumothorax.
Bones unremarkable.
IMPRESSION: No acute abnormalities.

## 2021-05-27 ENCOUNTER — Other Ambulatory Visit: Payer: Self-pay

## 2021-05-27 ENCOUNTER — Encounter (HOSPITAL_BASED_OUTPATIENT_CLINIC_OR_DEPARTMENT_OTHER): Payer: Self-pay | Admitting: *Deleted

## 2021-05-27 ENCOUNTER — Emergency Department (HOSPITAL_BASED_OUTPATIENT_CLINIC_OR_DEPARTMENT_OTHER)
Admission: EM | Admit: 2021-05-27 | Discharge: 2021-05-27 | Disposition: A | Discharge status: Home / Self Care | Payer: Medicare Other | Attending: Emergency Medicine | Admitting: Emergency Medicine

## 2021-05-27 DIAGNOSIS — J45909 Unspecified asthma, uncomplicated: Secondary | ICD-10-CM | POA: Insufficient documentation

## 2021-05-27 DIAGNOSIS — E1165 Type 2 diabetes mellitus with hyperglycemia: Secondary | ICD-10-CM | POA: Insufficient documentation

## 2021-05-27 DIAGNOSIS — Z87891 Personal history of nicotine dependence: Secondary | ICD-10-CM | POA: Diagnosis not present

## 2021-05-27 DIAGNOSIS — R739 Hyperglycemia, unspecified: Secondary | ICD-10-CM

## 2021-05-27 DIAGNOSIS — I10 Essential (primary) hypertension: Secondary | ICD-10-CM | POA: Diagnosis not present

## 2021-05-27 DIAGNOSIS — Z794 Long term (current) use of insulin: Secondary | ICD-10-CM | POA: Insufficient documentation

## 2021-05-27 DIAGNOSIS — Z79899 Other long term (current) drug therapy: Secondary | ICD-10-CM | POA: Insufficient documentation

## 2021-05-27 DIAGNOSIS — R5383 Other fatigue: Secondary | ICD-10-CM | POA: Diagnosis present

## 2021-05-27 LAB — BASIC METABOLIC PANEL
Anion gap: 7 (ref 5–15)
BUN: 12 mg/dL (ref 8–23)
CO2: 30 mmol/L (ref 22–32)
Calcium: 9.2 mg/dL (ref 8.9–10.3)
Chloride: 100 mmol/L (ref 98–111)
Creatinine, Ser: 1.09 mg/dL — ABNORMAL HIGH (ref 0.44–1.00)
GFR, Estimated: 56 mL/min — ABNORMAL LOW (ref >60)
Glucose, Bld: 376 mg/dL — ABNORMAL HIGH (ref 70–99)
Potassium: 3.9 mmol/L (ref 3.5–5.1)
Sodium: 137 mmol/L (ref 135–145)

## 2021-05-27 LAB — URINALYSIS, ROUTINE W REFLEX MICROSCOPIC
Bilirubin Urine: NEGATIVE
Glucose, UA: >1000 mg/dL — AB
Ketones, ur: NEGATIVE mg/dL
Leukocytes,Ua: NEGATIVE
Nitrite: NEGATIVE
Protein, ur: 100 mg/dL — AB
Specific Gravity, Urine: 1.024 (ref 1.005–1.030)
pH: 6.5 (ref 5.0–8.0)

## 2021-05-27 LAB — CBC
HCT: 33.7 % — ABNORMAL LOW (ref 36.0–46.0)
Hemoglobin: 11.1 g/dL — ABNORMAL LOW (ref 12.0–15.0)
MCH: 32 pg (ref 26.0–34.0)
MCHC: 32.9 g/dL (ref 30.0–36.0)
MCV: 97.1 fL (ref 80.0–100.0)
Platelets: 250 10*3/uL (ref 150–400)
RBC: 3.47 MIL/uL — ABNORMAL LOW (ref 3.87–5.11)
RDW: 13 % (ref 11.5–15.5)
WBC: 5.6 10*3/uL (ref 4.0–10.5)
nRBC: 0 % (ref 0.0–0.2)

## 2021-05-27 LAB — CBG MONITORING, ED
Glucose-Capillary: 379 mg/dL — ABNORMAL HIGH (ref 70–99)
Glucose-Capillary: 330 mg/dL — ABNORMAL HIGH (ref 70–99)

## 2021-05-27 MED ORDER — INSULIN ASPART PROT & ASPART (70-30 MIX) 100 UNIT/ML ~~LOC~~ SUSP
10.0000 [IU] | Freq: Once | SUBCUTANEOUS | Status: AC
  Administered 2021-05-27: 10 [IU] via SUBCUTANEOUS
  Filled 2021-05-27: qty 10

## 2021-05-27 MED ORDER — IPRATROPIUM-ALBUTEROL 0.5-2.5 (3) MG/3ML IN SOLN
3.0000 mL | Freq: Once | RESPIRATORY_TRACT | Status: AC
  Administered 2021-05-27: 3 mL via RESPIRATORY_TRACT
  Filled 2021-05-27: qty 3

## 2021-05-27 MED ORDER — SODIUM CHLORIDE 0.9 % IV BOLUS
1000.0000 mL | Freq: Once | INTRAVENOUS | Status: AC
  Administered 2021-05-27: 1000 mL via INTRAVENOUS

## 2021-05-27 MED ORDER — INSULIN ASPART PROT & ASPART (70-30 MIX) 100 UNIT/ML ~~LOC~~ SUSP: 15.0000 [IU] | Freq: Every day | SUBCUTANEOUS | 0 refills | Status: DC

## 2021-05-27 NOTE — Discharge Instructions (Signed)
Please feel your insulin prescription at one of the 24-hour pharmacies.  Please return to the emergency department if you experience similar symptoms or are unable to fill your insulin.

## 2021-05-27 NOTE — ED Provider Notes (Signed)
[facility] EMERGENCY DEPT Provider Note   CSN: [id number] Arrival date & time: 05/27/21  1444     History Chief Complaint  Patient presents with   Hypoglycemia    Lori Gates is a 65 y.o. female with history of insulin-dependent diabetes who presents the emergency department with hyperglycemia that has been constant worsening of the last 3 days.  Patient states that she is visiting her grandchildren and one of her grandchildren got a hold of her insulin vials and they went missing.  She is been without her basal insulin for the last 3 days.  Sugars have been running over 300. She is been reporting associated generalized malaise and fatigue.  No polyuria, polydipsia, chest pain, shortness of breath, nausea, diarrhea.   Hypoglycemia     Past Medical History:  Diagnosis Date   Asthma    Diabetes mellitus without complication (HCC)    Hypertension     There are no problems to display for this patient.   Past Surgical History:  Procedure Laterality Date   TUBAL LIGATION       OB History   No obstetric history on file.     No family history on file.  Social History   Tobacco Use   Smoking status: Former    Types: Cigarettes  Building services engineer Use: Never used  Substance Use Topics   Alcohol use: Never    Home Medications Prior to Admission medications   Medication Sig Start Date End Date Taking? Authorizing Provider  albuterol-ipratropium (COMBIVENT) 18-103 MCG/ACT inhaler Inhale 2 puffs into the lungs every 6 (six) hours as needed. For shortness of breath    [provider]  amLODipine (NORVASC) 10 MG tablet Take 10 mg by mouth daily.    [provider]  ibuprofen (ADVIL,MOTRIN) 200 MG tablet Take 400 mg by mouth every 6 (six) hours as needed. For pain    [provider]  insulin aspart protamine- aspart (NOVOLOG MIX 70/30) (70-30) 100 UNIT/ML injection Inject 0.15 mLs (15 Units total) into the skin daily with  breakfast. 05/27/21   Lori Loh M, PA-C  insulin glargine (LANTUS) 100 UNIT/ML injection Inject 20 Units into the skin at bedtime.    [provider]  ondansetron (ZOFRAN) 4 MG tablet Take 1 tablet (4 mg total) by mouth every 8 (eight) hours as needed for nausea. 05/06/12   Pisciotta, Joni Reining, PA-C  oseltamivir (TAMIFLU) 75 MG capsule Take 1 capsule (75 mg total) by mouth every 12 (twelve) hours. 05/06/12   Pisciotta, Joni Reining, PA-C  thiamine (VITAMIN B-1) 100 MG tablet Take 100 mg by mouth daily.    [provider]    Allergies    Patient has no known allergies.  Review of Systems   Review of Systems  All other systems reviewed and are negative.  Physical Exam Updated Vital Signs BP (!) 138/116    Pulse 69    Temp 98.4 F (36.9 C) (Oral)    Resp 18    Ht 5\' 5"  (1.651 Gates)    Wt 59.9 kg    SpO2 97%    BMI 21.97 kg/Gates   Physical Exam Vitals and nursing note reviewed.  Constitutional:      General: She is not in acute distress.    Appearance: Normal appearance.  HENT:     Head: Normocephalic and atraumatic.  Eyes:     General:        Right eye: No discharge.  Left eye: No discharge.  Cardiovascular:     Comments: Regular rate and rhythm.  S1/S2 are distinct without any evidence of murmur, rubs, or gallops.  Radial pulses are 2+ bilaterally.  Dorsalis pedis pulses are 2+ bilaterally.  No evidence of pedal edema. Pulmonary:     Comments: Clear to auscultation bilaterally.  Normal effort.  No respiratory distress.  No evidence of wheezes, rales, or rhonchi heard throughout. Abdominal:     General: Abdomen is flat. Bowel sounds are normal. There is no distension.     Tenderness: There is no abdominal tenderness. There is no guarding or rebound.  Musculoskeletal:        General: Normal range of motion.     Cervical back: Neck supple.  Skin:    General: Skin is warm and dry.     Findings: No rash.  Neurological:     General: No focal deficit present.      Mental Status: She is alert.  Psychiatric:        Mood and Affect: Mood normal.        Behavior: Behavior normal.    ED Results / Procedures / Treatments   Labs (all labs ordered are listed, but only abnormal results are displayed) Labs Reviewed  BASIC METABOLIC PANEL - Abnormal; Notable for the following components:      Result Value   Glucose, Bld 376 (*)    Creatinine, Ser 1.09 (*)    GFR, Estimated 56 (*)    All other components within normal limits  CBC - Abnormal; Notable for the following components:   RBC 3.47 (*)    Hemoglobin 11.1 (*)    HCT 33.7 (*)    All other components within normal limits  URINALYSIS, ROUTINE W REFLEX MICROSCOPIC - Abnormal; Notable for the following components:   Color, Urine COLORLESS (*)    Glucose, UA >1,000 (*)    Hgb urine dipstick TRACE (*)    Protein, ur 100 (*)    Bacteria, UA RARE (*)    All other components within normal limits  CBG MONITORING, ED - Abnormal; Notable for the following components:   Glucose-Capillary 379 (*)    All other components within normal limits  CBG MONITORING, ED - Abnormal; Notable for the following components:   Glucose-Capillary 330 (*)    All other components within normal limits  CBG MONITORING, ED    EKG None  Radiology No results found.  Procedures Procedures   Medications Ordered in ED Medications  insulin aspart protamine- aspart (NOVOLOG MIX 70/30) injection 10 Units (has no administration in time range)  sodium chloride 0.9 % bolus 1,000 mL (0 mLs Intravenous Stopped 05/27/21 1909)  ipratropium-albuterol (DUONEB) 0.5-2.5 (3) MG/3ML nebulizer solution 3 mL (3 mLs Nebulization Given 05/27/21 1824)    ED Course  I have reviewed the triage vital signs and the nursing notes.  Pertinent labs & imaging results that were available during my care of the patient were reviewed by me and considered in my medical decision making (see chart for details).    MDM Rules/Calculators/A&P                           Lori Gates is a 65 y.o. female who presents the emergency department with hypoglycemic episodes. Patient is in no acute distress at this time resting comfortably in the emergency department.  Vital signs are stable.  Initial work-up ordered in triage. CBC was without any  leukocytosis.  Did show mild anemia.  Glucose was 379.  BMP showed mild bump in her creatinine.  UA was without any signs of infection.  Patient is well-appearing.  I will give her a liter of fluid and reassess her sugar.  I will likely give her some insulin and discharge her with prescription for her NovoLog 70/30 sliding scale pen.  She does have her basal insulin which she has been taking. She is talking in complete sentences and is not altered. No focal weakness or numbness.   Repeat blood sugar was 330 after a liter of fluids.  I will give her 10 units of insulin for her sliding scale.  We will also give her a prescription for NovoLog 70/30 to get that filled tomorrow.  Strict turn precautions given.  She is safe for discharge.  Final Clinical Impression(s) / ED Diagnoses Final diagnoses:  Hyperglycemia    Rx / DC Orders ED Discharge Orders          Ordered    insulin aspart protamine- aspart (NOVOLOG MIX 70/30) (70-30) 100 UNIT/ML injection  Daily with breakfast        05/27/21 2013             Lori Loh [place], [place] 05/27/21 2016    Melene Plan, DO 05/27/21 2024

## 2021-05-27 NOTE — ED Triage Notes (Signed)
Patient has not have her insulin shot for 3 days.  Her grandchildren lost the vials.  Last CBG 243.

## 2021-05-28 ENCOUNTER — Telehealth (HOSPITAL_BASED_OUTPATIENT_CLINIC_OR_DEPARTMENT_OTHER): Payer: Self-pay | Admitting: Emergency Medicine

## 2021-05-28 MED ORDER — INSULIN ASPART PROT & ASPART (70-30 MIX) 100 UNIT/ML ~~LOC~~ SUSP: 15.0000 [IU] | Freq: Every day | SUBCUTANEOUS | 0 refills | Status: DC

## 2021-05-28 NOTE — Telephone Encounter (Unsigned)
Patient reports that provided prescription copy could not be filled due to no ICD 10 code on the hardcopy.  They have return to the emergency department and requested that their insulin prescription for NovoLog 70/30 0.15 mL into the skin daily with breakfast be filled electronically and routed to [facility] pharmacy in [place].  This has been done per patient request and review of the prescribed medications from the encounter on 12\31\2022.

## 2023-09-24 ENCOUNTER — Emergency Department (HOSPITAL_COMMUNITY)

## 2023-09-24 ENCOUNTER — Inpatient Hospital Stay (HOSPITAL_COMMUNITY)
Admission: EM | Admit: 2023-09-24 | Discharge: 2023-10-19 | DRG: 853 | Disposition: A | Discharge status: Skilled Nursing Facility | Payer: Medicare (Managed Care) | Attending: Family Medicine | Admitting: Family Medicine

## 2023-09-24 ENCOUNTER — Other Ambulatory Visit: Payer: Self-pay

## 2023-09-24 ENCOUNTER — Inpatient Hospital Stay (HOSPITAL_COMMUNITY)

## 2023-09-24 ENCOUNTER — Encounter (HOSPITAL_COMMUNITY): Payer: Self-pay | Admitting: Emergency Medicine

## 2023-09-24 DIAGNOSIS — F03911 Unspecified dementia, unspecified severity, with agitation: Secondary | ICD-10-CM | POA: Diagnosis not present

## 2023-09-24 DIAGNOSIS — R531 Weakness: Secondary | ICD-10-CM | POA: Diagnosis not present

## 2023-09-24 DIAGNOSIS — R569 Unspecified convulsions: Secondary | ICD-10-CM | POA: Diagnosis not present

## 2023-09-24 DIAGNOSIS — F1491 Cocaine use, unspecified, in remission: Secondary | ICD-10-CM | POA: Diagnosis not present

## 2023-09-24 DIAGNOSIS — I82612 Acute embolism and thrombosis of superficial veins of left upper extremity: Secondary | ICD-10-CM | POA: Diagnosis present

## 2023-09-24 DIAGNOSIS — E538 Deficiency of other specified B group vitamins: Secondary | ICD-10-CM | POA: Diagnosis present

## 2023-09-24 DIAGNOSIS — I5031 Acute diastolic (congestive) heart failure: Secondary | ICD-10-CM | POA: Diagnosis not present

## 2023-09-24 DIAGNOSIS — E1100 Type 2 diabetes mellitus with hyperosmolarity without nonketotic hyperglycemic-hyperosmolar coma (NKHHC): Secondary | ICD-10-CM

## 2023-09-24 DIAGNOSIS — G928 Other toxic encephalopathy: Secondary | ICD-10-CM | POA: Diagnosis not present

## 2023-09-24 DIAGNOSIS — I480 Paroxysmal atrial fibrillation: Secondary | ICD-10-CM | POA: Diagnosis not present

## 2023-09-24 DIAGNOSIS — F1410 Cocaine abuse, uncomplicated: Secondary | ICD-10-CM | POA: Diagnosis present

## 2023-09-24 DIAGNOSIS — E871 Hypo-osmolality and hyponatremia: Secondary | ICD-10-CM | POA: Diagnosis present

## 2023-09-24 DIAGNOSIS — I110 Hypertensive heart disease with heart failure: Secondary | ICD-10-CM | POA: Diagnosis not present

## 2023-09-24 DIAGNOSIS — J690 Pneumonitis due to inhalation of food and vomit: Secondary | ICD-10-CM | POA: Diagnosis not present

## 2023-09-24 DIAGNOSIS — R29726 NIHSS score 26: Secondary | ICD-10-CM | POA: Diagnosis present

## 2023-09-24 DIAGNOSIS — R1312 Dysphagia, oropharyngeal phase: Secondary | ICD-10-CM | POA: Diagnosis not present

## 2023-09-24 DIAGNOSIS — I132 Hypertensive heart and chronic kidney disease with heart failure and with stage 5 chronic kidney disease, or end stage renal disease: Secondary | ICD-10-CM | POA: Diagnosis present

## 2023-09-24 DIAGNOSIS — E1165 Type 2 diabetes mellitus with hyperglycemia: Secondary | ICD-10-CM | POA: Diagnosis not present

## 2023-09-24 DIAGNOSIS — E1110 Type 2 diabetes mellitus with ketoacidosis without coma: Secondary | ICD-10-CM | POA: Diagnosis not present

## 2023-09-24 DIAGNOSIS — D141 Benign neoplasm of larynx: Secondary | ICD-10-CM | POA: Diagnosis not present

## 2023-09-24 DIAGNOSIS — R7989 Other specified abnormal findings of blood chemistry: Secondary | ICD-10-CM | POA: Diagnosis not present

## 2023-09-24 DIAGNOSIS — E1111 Type 2 diabetes mellitus with ketoacidosis with coma: Secondary | ICD-10-CM | POA: Diagnosis not present

## 2023-09-24 DIAGNOSIS — I2489 Other forms of acute ischemic heart disease: Secondary | ICD-10-CM | POA: Diagnosis present

## 2023-09-24 DIAGNOSIS — N390 Urinary tract infection, site not specified: Secondary | ICD-10-CM | POA: Diagnosis present

## 2023-09-24 DIAGNOSIS — Z9851 Tubal ligation status: Secondary | ICD-10-CM

## 2023-09-24 DIAGNOSIS — G4089 Other seizures: Secondary | ICD-10-CM | POA: Diagnosis not present

## 2023-09-24 DIAGNOSIS — I509 Heart failure, unspecified: Secondary | ICD-10-CM | POA: Diagnosis not present

## 2023-09-24 DIAGNOSIS — R6520 Severe sepsis without septic shock: Secondary | ICD-10-CM | POA: Diagnosis present

## 2023-09-24 DIAGNOSIS — H538 Other visual disturbances: Secondary | ICD-10-CM | POA: Diagnosis present

## 2023-09-24 DIAGNOSIS — Z7409 Other reduced mobility: Secondary | ICD-10-CM | POA: Diagnosis not present

## 2023-09-24 DIAGNOSIS — Z7984 Long term (current) use of oral hypoglycemic drugs: Secondary | ICD-10-CM

## 2023-09-24 DIAGNOSIS — J189 Pneumonia, unspecified organism: Secondary | ICD-10-CM | POA: Diagnosis not present

## 2023-09-24 DIAGNOSIS — J343 Hypertrophy of nasal turbinates: Secondary | ICD-10-CM | POA: Diagnosis present

## 2023-09-24 DIAGNOSIS — E872 Acidosis, unspecified: Secondary | ICD-10-CM | POA: Diagnosis present

## 2023-09-24 DIAGNOSIS — R58 Hemorrhage, not elsewhere classified: Secondary | ICD-10-CM | POA: Diagnosis not present

## 2023-09-24 DIAGNOSIS — Z992 Dependence on renal dialysis: Secondary | ICD-10-CM | POA: Diagnosis not present

## 2023-09-24 DIAGNOSIS — R471 Dysarthria and anarthria: Secondary | ICD-10-CM | POA: Diagnosis present

## 2023-09-24 DIAGNOSIS — G9340 Encephalopathy, unspecified: Secondary | ICD-10-CM | POA: Diagnosis present

## 2023-09-24 DIAGNOSIS — L89152 Pressure ulcer of sacral region, stage 2: Secondary | ICD-10-CM | POA: Diagnosis not present

## 2023-09-24 DIAGNOSIS — J3801 Paralysis of vocal cords and larynx, unilateral: Secondary | ICD-10-CM | POA: Diagnosis not present

## 2023-09-24 DIAGNOSIS — J9601 Acute respiratory failure with hypoxia: Secondary | ICD-10-CM | POA: Diagnosis present

## 2023-09-24 DIAGNOSIS — N184 Chronic kidney disease, stage 4 (severe): Secondary | ICD-10-CM

## 2023-09-24 DIAGNOSIS — A419 Sepsis, unspecified organism: Principal | ICD-10-CM | POA: Diagnosis present

## 2023-09-24 DIAGNOSIS — I129 Hypertensive chronic kidney disease with stage 1 through stage 4 chronic kidney disease, or unspecified chronic kidney disease: Secondary | ICD-10-CM | POA: Diagnosis not present

## 2023-09-24 DIAGNOSIS — N179 Acute kidney failure, unspecified: Secondary | ICD-10-CM | POA: Diagnosis not present

## 2023-09-24 DIAGNOSIS — J383 Other diseases of vocal cords: Secondary | ICD-10-CM | POA: Diagnosis not present

## 2023-09-24 DIAGNOSIS — N186 End stage renal disease: Secondary | ICD-10-CM | POA: Diagnosis present

## 2023-09-24 DIAGNOSIS — M7989 Other specified soft tissue disorders: Secondary | ICD-10-CM | POA: Diagnosis not present

## 2023-09-24 DIAGNOSIS — Z7401 Bed confinement status: Secondary | ICD-10-CM | POA: Diagnosis not present

## 2023-09-24 DIAGNOSIS — J381 Polyp of vocal cord and larynx: Secondary | ICD-10-CM | POA: Diagnosis not present

## 2023-09-24 DIAGNOSIS — E11649 Type 2 diabetes mellitus with hypoglycemia without coma: Secondary | ICD-10-CM | POA: Diagnosis not present

## 2023-09-24 DIAGNOSIS — I4891 Unspecified atrial fibrillation: Secondary | ICD-10-CM | POA: Diagnosis not present

## 2023-09-24 DIAGNOSIS — I6389 Other cerebral infarction: Secondary | ICD-10-CM | POA: Diagnosis not present

## 2023-09-24 DIAGNOSIS — Z743 Need for continuous supervision: Secondary | ICD-10-CM | POA: Diagnosis not present

## 2023-09-24 DIAGNOSIS — J9602 Acute respiratory failure with hypercapnia: Secondary | ICD-10-CM | POA: Diagnosis not present

## 2023-09-24 DIAGNOSIS — E875 Hyperkalemia: Secondary | ICD-10-CM | POA: Diagnosis not present

## 2023-09-24 DIAGNOSIS — E1122 Type 2 diabetes mellitus with diabetic chronic kidney disease: Secondary | ICD-10-CM | POA: Diagnosis present

## 2023-09-24 DIAGNOSIS — L8990 Pressure ulcer of unspecified site, unspecified stage: Secondary | ICD-10-CM | POA: Diagnosis not present

## 2023-09-24 DIAGNOSIS — R4701 Aphasia: Secondary | ICD-10-CM | POA: Diagnosis present

## 2023-09-24 DIAGNOSIS — I5032 Chronic diastolic (congestive) heart failure: Secondary | ICD-10-CM | POA: Diagnosis present

## 2023-09-24 DIAGNOSIS — R110 Nausea: Secondary | ICD-10-CM | POA: Diagnosis not present

## 2023-09-24 DIAGNOSIS — G40309 Generalized idiopathic epilepsy and epileptic syndromes, not intractable, without status epilepticus: Secondary | ICD-10-CM | POA: Diagnosis not present

## 2023-09-24 DIAGNOSIS — J45909 Unspecified asthma, uncomplicated: Secondary | ICD-10-CM | POA: Diagnosis not present

## 2023-09-24 DIAGNOSIS — F1490 Cocaine use, unspecified, uncomplicated: Secondary | ICD-10-CM | POA: Diagnosis not present

## 2023-09-24 DIAGNOSIS — E43 Unspecified severe protein-calorie malnutrition: Secondary | ICD-10-CM | POA: Diagnosis not present

## 2023-09-24 DIAGNOSIS — R54 Age-related physical debility: Secondary | ICD-10-CM | POA: Diagnosis present

## 2023-09-24 DIAGNOSIS — J3800 Paralysis of vocal cords and larynx, unspecified: Secondary | ICD-10-CM | POA: Diagnosis not present

## 2023-09-24 DIAGNOSIS — G9341 Metabolic encephalopathy: Secondary | ICD-10-CM

## 2023-09-24 DIAGNOSIS — R220 Localized swelling, mass and lump, head: Secondary | ICD-10-CM | POA: Diagnosis present

## 2023-09-24 DIAGNOSIS — R4789 Other speech disturbances: Secondary | ICD-10-CM | POA: Diagnosis present

## 2023-09-24 DIAGNOSIS — M6281 Muscle weakness (generalized): Secondary | ICD-10-CM | POA: Diagnosis not present

## 2023-09-24 DIAGNOSIS — Z87891 Personal history of nicotine dependence: Secondary | ICD-10-CM

## 2023-09-24 DIAGNOSIS — Z8673 Personal history of transient ischemic attack (TIA), and cerebral infarction without residual deficits: Secondary | ICD-10-CM

## 2023-09-24 DIAGNOSIS — E119 Type 2 diabetes mellitus without complications: Secondary | ICD-10-CM | POA: Diagnosis not present

## 2023-09-24 DIAGNOSIS — Z79899 Other long term (current) drug therapy: Secondary | ICD-10-CM

## 2023-09-24 DIAGNOSIS — D631 Anemia in chronic kidney disease: Secondary | ICD-10-CM | POA: Diagnosis present

## 2023-09-24 DIAGNOSIS — N2581 Secondary hyperparathyroidism of renal origin: Secondary | ICD-10-CM | POA: Diagnosis present

## 2023-09-24 DIAGNOSIS — Z794 Long term (current) use of insulin: Secondary | ICD-10-CM

## 2023-09-24 DIAGNOSIS — E785 Hyperlipidemia, unspecified: Secondary | ICD-10-CM | POA: Diagnosis present

## 2023-09-24 DIAGNOSIS — R41841 Cognitive communication deficit: Secondary | ICD-10-CM | POA: Diagnosis not present

## 2023-09-24 DIAGNOSIS — R061 Stridor: Secondary | ICD-10-CM

## 2023-09-24 DIAGNOSIS — Z6825 Body mass index (BMI) 25.0-25.9, adult: Secondary | ICD-10-CM

## 2023-09-24 LAB — CBC WITH DIFFERENTIAL/PLATELET
Abs Immature Granulocytes: 0.06 10*3/uL (ref 0.00–0.07)
Basophils Absolute: 0.1 10*3/uL (ref 0.0–0.1)
Basophils Relative: 1 %
Eosinophils Absolute: 0 10*3/uL (ref 0.0–0.5)
Eosinophils Relative: 0 %
HCT: 38.7 % (ref 36.0–46.0)
Hemoglobin: 12 g/dL (ref 12.0–15.0)
Immature Granulocytes: 1 %
Lymphocytes Relative: 7 %
Lymphs Abs: 0.9 10*3/uL (ref 0.7–4.0)
MCH: 31 pg (ref 26.0–34.0)
MCHC: 31 g/dL (ref 30.0–36.0)
MCV: 100 fL (ref 80.0–100.0)
Monocytes Absolute: 0.6 10*3/uL (ref 0.1–1.0)
Monocytes Relative: 5 %
Neutro Abs: 11.4 10*3/uL — ABNORMAL HIGH (ref 1.7–7.7)
Neutrophils Relative %: 86 %
Platelets: 277 10*3/uL (ref 150–400)
RBC: 3.87 MIL/uL (ref 3.87–5.11)
RDW: 12.4 % (ref 11.5–15.5)
WBC: 13 10*3/uL — ABNORMAL HIGH (ref 4.0–10.5)
nRBC: 0 % (ref 0.0–0.2)

## 2023-09-24 LAB — COMPREHENSIVE METABOLIC PANEL WITH GFR
ALT: 12 U/L (ref 0–44)
AST: 19 U/L (ref 15–41)
Albumin: 3.6 g/dL (ref 3.5–5.0)
Alkaline Phosphatase: 136 U/L — ABNORMAL HIGH (ref 38–126)
Anion gap: 16 — ABNORMAL HIGH (ref 5–15)
BUN: 19 mg/dL (ref 8–23)
CO2: 22 mmol/L (ref 22–32)
Calcium: 8.6 mg/dL — ABNORMAL LOW (ref 8.9–10.3)
Chloride: 91 mmol/L — ABNORMAL LOW (ref 98–111)
Creatinine, Ser: 2.64 mg/dL — ABNORMAL HIGH (ref 0.44–1.00)
GFR, Estimated: 19 mL/min — ABNORMAL LOW (ref >60)
Glucose, Bld: 989 mg/dL (ref 70–99)
Potassium: 3.5 mmol/L (ref 3.5–5.1)
Sodium: 129 mmol/L — ABNORMAL LOW (ref 135–145)
Total Bilirubin: 0.6 mg/dL (ref 0.0–1.2)
Total Protein: 7.6 g/dL (ref 6.5–8.1)

## 2023-09-24 LAB — CK: Total CK: 121 U/L (ref 38–234)

## 2023-09-24 LAB — URINALYSIS, W/ REFLEX TO CULTURE (INFECTION SUSPECTED)
Bilirubin Urine: NEGATIVE
Glucose, UA: >=500 mg/dL — AB
Ketones, ur: 5 mg/dL — AB
Nitrite: NEGATIVE
Protein, ur: >=300 mg/dL — AB
Specific Gravity, Urine: 1.021 (ref 1.005–1.030)
WBC, UA: >50 WBC/hpf (ref 0–5)
pH: 5 (ref 5.0–8.0)

## 2023-09-24 LAB — LACTIC ACID, PLASMA
Lactic Acid, Venous: 2.8 mmol/L (ref 0.5–1.9)
Lactic Acid, Venous: 2.9 mmol/L (ref 0.5–1.9)
Lactic Acid, Venous: 3.5 mmol/L (ref 0.5–1.9)
Lactic Acid, Venous: 4.2 mmol/L (ref 0.5–1.9)

## 2023-09-24 LAB — RAPID URINE DRUG SCREEN, HOSP PERFORMED
Amphetamines: NOT DETECTED
Barbiturates: NOT DETECTED
Benzodiazepines: POSITIVE — AB
Cocaine: POSITIVE — AB
Opiates: NOT DETECTED
Tetrahydrocannabinol: NOT DETECTED

## 2023-09-24 LAB — TROPONIN I (HIGH SENSITIVITY)
Troponin I (High Sensitivity): 114 ng/L (ref <18)
Troponin I (High Sensitivity): 136 ng/L (ref <18)
Troponin I (High Sensitivity): 164 ng/L (ref <18)
Troponin I (High Sensitivity): 180 ng/L (ref <18)

## 2023-09-24 LAB — BLOOD GAS, VENOUS
Acid-base deficit: 7.1 mmol/L — ABNORMAL HIGH (ref 0.0–2.0)
Bicarbonate: 24.3 mmol/L (ref 20.0–28.0)
Drawn by: 51519
O2 Saturation: 59.9 %
Patient temperature: 36.7
pCO2, Ven: 81 mmHg (ref 44–60)
pH, Ven: 7.08 — CL (ref 7.25–7.43)
pO2, Ven: 36 mmHg (ref 32–45)

## 2023-09-24 LAB — BLOOD GAS, ARTERIAL
Acid-base deficit: 4.7 mmol/L — ABNORMAL HIGH (ref 0.0–2.0)
Bicarbonate: 19.2 mmol/L — ABNORMAL LOW (ref 20.0–28.0)
Drawn by: 23430
O2 Saturation: 99 %
Patient temperature: 35.7
pCO2 arterial: 29 mmHg — ABNORMAL LOW (ref 32–48)
pH, Arterial: 7.42 (ref 7.35–7.45)
pO2, Arterial: 130 mmHg — ABNORMAL HIGH (ref 83–108)

## 2023-09-24 LAB — PROTIME-INR
INR: 1 (ref 0.8–1.2)
Prothrombin Time: 13.4 s (ref 11.4–15.2)

## 2023-09-24 LAB — CBG MONITORING, ED
Glucose-Capillary: >600 mg/dL (ref 70–99)
Glucose-Capillary: >600 mg/dL (ref 70–99)
Glucose-Capillary: >600 mg/dL (ref 70–99)
Glucose-Capillary: >600 mg/dL (ref 70–99)
Glucose-Capillary: >600 mg/dL (ref 70–99)
Glucose-Capillary: >600 mg/dL (ref 70–99)
Glucose-Capillary: >600 mg/dL (ref 70–99)
Glucose-Capillary: >600 mg/dL (ref 70–99)

## 2023-09-24 LAB — ETHANOL: Alcohol, Ethyl (B): <15 mg/dL (ref <15)

## 2023-09-24 LAB — MAGNESIUM: Magnesium: 2.2 mg/dL (ref 1.7–2.4)

## 2023-09-24 LAB — OSMOLALITY: Osmolality: 349 mosm/kg (ref 275–295)

## 2023-09-24 LAB — GLUCOSE, CAPILLARY
Glucose-Capillary: >600 mg/dL (ref 70–99)
Glucose-Capillary: >600 mg/dL (ref 70–99)

## 2023-09-24 LAB — BETA-HYDROXYBUTYRIC ACID: Beta-Hydroxybutyric Acid: 4.15 mmol/L — ABNORMAL HIGH (ref 0.05–0.27)

## 2023-09-24 MED ORDER — ROCURONIUM BROMIDE 10 MG/ML (PF) SYRINGE
1.0000 mg/kg | PREFILLED_SYRINGE | Freq: Once | INTRAVENOUS | Status: AC
Start: 2023-09-24 — End: 2023-09-24
  Administered 2023-09-24: 60 mg via INTRAVENOUS
  Filled 2023-09-24: qty 10

## 2023-09-24 MED ORDER — PROPOFOL 10 MG/ML IV BOLUS
INTRAVENOUS | Status: AC
  Filled 2023-09-24: qty 20

## 2023-09-24 MED ORDER — LACTATED RINGERS IV BOLUS: 1000.0000 mL | Freq: Once | INTRAVENOUS | Status: DC

## 2023-09-24 MED ORDER — INSULIN REGULAR(HUMAN) IN NACL 100-0.9 UT/100ML-% IV SOLN
INTRAVENOUS | Status: DC
  Administered 2023-09-24: 7.5 [IU]/h via INTRAVENOUS
  Administered 2023-09-25: 9.5 [IU]/h via INTRAVENOUS
  Filled 2023-09-24 (×2): qty 100

## 2023-09-24 MED ORDER — CHLORHEXIDINE GLUCONATE CLOTH 2 % EX PADS: 6.0000 | MEDICATED_PAD | Freq: Every day | CUTANEOUS | Status: DC

## 2023-09-24 MED ORDER — THIAMINE MONONITRATE 100 MG PO TABS
100.0000 mg | ORAL_TABLET | Freq: Every day | ORAL | Status: DC
  Administered 2023-09-25 – 2023-09-29 (×5): 100 mg
  Filled 2023-09-24 (×5): qty 1

## 2023-09-24 MED ORDER — PROPOFOL 1000 MG/100ML IV EMUL: 5.0000 ug/kg/min | INTRAVENOUS | Status: DC

## 2023-09-24 MED ORDER — POTASSIUM CHLORIDE 10 MEQ/100ML IV SOLN
10.0000 meq | INTRAVENOUS | Status: AC
  Administered 2023-09-24 (×2): 10 meq via INTRAVENOUS
  Filled 2023-09-24 (×2): qty 100

## 2023-09-24 MED ORDER — LACTATED RINGERS IV BOLUS
20.0000 mL/kg | Freq: Once | INTRAVENOUS | Status: AC
  Administered 2023-09-24: 1200 mL via INTRAVENOUS

## 2023-09-24 MED ORDER — LEVETIRACETAM IN NACL 1000 MG/100ML IV SOLN
INTRAVENOUS | Status: AC
  Filled 2023-09-24: qty 200

## 2023-09-24 MED ORDER — IPRATROPIUM-ALBUTEROL 0.5-2.5 (3) MG/3ML IN SOLN: 3.0000 mL | RESPIRATORY_TRACT | Status: DC | PRN

## 2023-09-24 MED ORDER — ETOMIDATE 2 MG/ML IV SOLN
0.3000 mg/kg | Freq: Once | INTRAVENOUS | Status: AC
  Administered 2023-09-24: 18 mg via INTRAVENOUS
  Filled 2023-09-24 (×2): qty 10

## 2023-09-24 MED ORDER — DEXTROSE IN LACTATED RINGERS 5 % IV SOLN: INTRAVENOUS | Status: AC

## 2023-09-24 MED ORDER — LEVETIRACETAM IN NACL 1000 MG/100ML IV SOLN: 1000.0000 mg | Freq: Once | INTRAVENOUS | Status: DC

## 2023-09-24 MED ORDER — SODIUM CHLORIDE 0.9 % IV SOLN
1.0000 g | Freq: Once | INTRAVENOUS | Status: AC
  Administered 2023-09-24: 1 g via INTRAVENOUS
  Filled 2023-09-24: qty 10

## 2023-09-24 MED ORDER — PROPOFOL 1000 MG/100ML IV EMUL
INTRAVENOUS | Status: AC
  Administered 2023-09-24: 5 ug/kg/min via INTRAVENOUS
  Filled 2023-09-24: qty 100

## 2023-09-24 MED ORDER — SODIUM CHLORIDE 0.9 % IV SOLN
2000.0000 mg | Freq: Once | INTRAVENOUS | Status: AC
Start: 2023-09-24 — End: 2023-09-24
  Administered 2023-09-24: 2000 mg via INTRAVENOUS

## 2023-09-24 MED ORDER — ATORVASTATIN CALCIUM 80 MG PO TABS
80.0000 mg | ORAL_TABLET | Freq: Every day | ORAL | Status: DC
  Administered 2023-09-25 – 2023-09-29 (×5): 80 mg
  Filled 2023-09-24 (×5): qty 1

## 2023-09-24 MED ORDER — LACTATED RINGERS IV SOLN: INTRAVENOUS | Status: AC

## 2023-09-24 MED ORDER — LORAZEPAM 2 MG/ML IJ SOLN
INTRAMUSCULAR | Status: AC
  Filled 2023-09-24: qty 1

## 2023-09-24 MED ORDER — MIDAZOLAM HCL 2 MG/2ML IJ SOLN
2.0000 mg | Freq: Once | INTRAMUSCULAR | Status: AC
  Administered 2023-09-24: 2 mg via INTRAVENOUS
  Filled 2023-09-24: qty 2

## 2023-09-24 MED ORDER — DEXTROSE 50 % IV SOLN
0.0000 mL | INTRAVENOUS | Status: DC | PRN
  Administered 2023-09-28 (×2): 50 mL via INTRAVENOUS
  Filled 2023-09-24 (×2): qty 50

## 2023-09-24 NOTE — Consult Note (Addendum)
 [facility] Telemedicine Consult   Requesting Provider: Hiawatha Lout, MD Consult Participants: Myself, bedside nurse, [facility] nurse, patient Location of the provider: [facility]  Location of the patient: [facility]   This consult was provided via telemedicine with 2-way video and audio communication. The patient/family was informed that care would be provided in this way and agreed to receive care in this manner.    Chief Complaint: seizure   HPI: This is a 68 year old woman with past medical history significant for CKD stage IV, pending fistula placement for plan to start dialysis, uncontrolled diabetes, hypertension, hyperlipidemia, smoking, cocaine abuse (reportedly stopped in March 2024), congestive heart failure  No family at bedside, emergency contact listed in her chart has a number on file that has been disconnected  Per EMS report last seen normal at 9:30 in the morning.  Then found on the floor seizing at 3 PM with glucose reading "high"   Given 2.5 mg of Versed by EMS team and an additional 2 of Ativan by ED on arrival due to right facial twitching and right gaze on her arrival, after which her gaze was midline and she was snoring, minimally responsive but would grimace to noxious stimulation  Code stroke activated by EMS for right gaze deviation; ED provider concern more for seizure than for stroke.   LKW: 9 AM Thrombolytic given?: No, out of the window  IR Thrombectomy? No,  MRS:  Time of teleneurologist evaluation: 3:33 PM  Exam: Vitals:   09/24/23 1527  BP: (!) 152/74  Pulse: (!) 109  Resp: 20  SpO2: 100%    General: Chronically ill-appearing Pulmonary: breathing shallowly Cardiac: regular rate and rhythm on monitor   Grimaces to noxious stim, PERRL per EDP but sluggish with gaze midline after Ativan  NIH Stroke scale 1A: Level of Consciousness - 2 1B: Ask Month and Age - 2 1C: 'Blink Eyes' & 'Squeeze Hands' - 2 2: Test Horizontal  Extraocular Movements - 0 3: Test Visual Fields - 3 4: Test Facial Palsy - 0 5A: Test Left Arm Motor Drift - 3 5B: Test Right Arm Motor Drift - 3 6A: Test Left Leg Motor Drift - 3 6B: Test Right Leg Motor Drift - 3 7: Test Limb Ataxia - X 8: Test Sensation - 0 9: Test Language/Aphasia- 3 10: Test Dysarthria - 2 11: Test Extinction/Inattention - 0 NIHSS score: 26   Imaging Reviewed:   Head CT without acute intracranial process on my review 1. No CT evidence of acute intracranial abnormality. 2. Left periorbital and left facial soft tissue swelling is partially visualized. Consider CT maxillofacial for further evaluation of facial trauma if clinically indicated. 3. Right mastoid effusion. Recommend correlation with tenderness over the mastoid temporal bone. 4. ASPECTS is 10  Labs reviewed in epic and pertinent values follow:   Basic Metabolic Panel: No results for input(s): "NA", "K", "CL", "CO2", "GLUCOSE", "BUN", "CREATININE", "CALCIUM", "MG", "PHOS" in the last 168 hours.  CBC: Recent Labs  Lab 09/24/23 1534  WBC 13.0*  NEUTROABS 11.4*  HGB 12.0  HCT 38.7  MCV 100.0  PLT 277    Coagulation Studies: Recent Labs    09/24/23 1534  LABPROT 13.4  INR 1.0       Assessment: Concern for seizures in the setting of severe hyperglycemia.  Given history of cocaine use would also need to rule out relapse as an etiology contributing to her symptoms.  Recommendations:  - Emergent head CT - Held off on CTA given ESRD  and overall presentation more concerning for seizure than stroke - STAT MRI brain - Agree with Keppra, 1 g given by ED will give additional 1 g now - Add on magnesium level, correct electrolyte derangements - Hold off on standing Keppra pending clinical course, glucose control   Addendum at 5 PM MRI negative for acute process on my review, full radiology report pending Per ED provider, patient needed to be intubated for airway protection and therefore  will be pending transfer to [facility].  No further clinical concern for seizure activity at this time Please notify neurology on arrival to [facility] so we may follow along   Baldwin Levee MD-PhD [facility] [phone]   If 8pm-8am, please page neurology on call as listed in AMION.  CRITICAL CARE Performed by: Ronnette Coke   Total critical care time: 50 minutes  Critical care time was exclusive of separately billable procedures and treating other patients.  Critical care was necessary to treat or prevent imminent or life-threatening deterioration -- emergent evaluation for consideration of thrombolytic, thrombectomy, concern for possible status epilepticus.  Critical care was time spent personally by me on the following activities: development of treatment plan with patient and/or surrogate as well as nursing, discussions with consultants, evaluation of patient's response to treatment, examination of patient, obtaining history from patient or surrogate, ordering and performing treatments and interventions, ordering and review of laboratory studies, ordering and review of radiographic studies, pulse oximetry and re-evaluation of patient's condition.

## 2023-09-24 NOTE — ED Triage Notes (Signed)
 Pt last normal 0930 this am. Daughter found pt lying in the floor around 1500. When ems arrived pt was unresponsive and seizing with blood sugar reading high. Ems rolled in with pt seizing and gave her 2.5 of versed.

## 2023-09-24 NOTE — ED Provider Notes (Signed)
 [facility] EMERGENCY DEPARTMENT AT [facility] Provider Note  CSN: [id number] Arrival date & time: 09/24/23 1518  Chief Complaint(s) Code Stroke  HPI Lori Gates is a 68 y.o. female history of diabetes, hypertension, end-stage renal disease not yet on dialysis, cocaine abuse presenting to the emergency department with altered mental status/code stroke.  Patient was apparently last seen well around 9 AM by family.  Granddaughter found patient on the floor with apparent seizure activity.  Paramedics were called and brought the patient here.  Blood glucose read high.  She received 2.5 mg of Versed en route.  Code stroke was activated as she was having some rightward gaze deviation.  History limited due to altered mental status/obtunded   Past Medical History Past Medical History:  Diagnosis Date   Asthma    Diabetes mellitus without complication (HCC)    Hypertension    Patient Active Problem List   Diagnosis Date Noted   Seizure (HCC) 09/24/2023   Home Medication(s) Prior to Admission medications   Medication Sig Start Date End Date Taking? Authorizing Provider  albuterol -ipratropium (COMBIVENT) 18-103 MCG/ACT inhaler Inhale 2 puffs into the lungs every 6 (six) hours as needed. For shortness of breath    [provider]  amLODipine (NORVASC) 5 MG tablet Take 5 mg by mouth daily.    [provider]  atorvastatin (LIPITOR) 80 MG tablet Take 80 mg by mouth daily.    [provider]  ibuprofen (ADVIL,MOTRIN) 200 MG tablet Take 400 mg by mouth every 6 (six) hours as needed. For pain    [provider]  insulin  aspart protamine- aspart (NOVOLOG  MIX 70/30) (70-30) 100 UNIT/ML injection Inject 0.15 mLs (15 Units total) into the skin daily with breakfast. 05/27/21   Darletta Ehrich, PA-C  insulin  aspart protamine- aspart (NOVOLOG  MIX 70/30) (70-30) 100 UNIT/ML injection Inject 0.15 mLs (15 Units total) into the skin daily with breakfast. 05/28/21    Wynetta Heckle, MD  insulin  lispro (HUMALOG) 100 UNIT/ML KwikPen Inject 13 Units into the skin 3 (three) times daily. 07/14/23   [provider]  LANTUS SOLOSTAR 100 UNIT/ML Solostar Pen Inject 20 Units into the skin at bedtime.    [provider]  nebivolol (BYSTOLIC) 2.5 MG tablet Take 2.5 mg by mouth daily. 07/14/23   [provider]  ondansetron  (ZOFRAN ) 4 MG tablet Take 1 tablet (4 mg total) by mouth every 8 (eight) hours as needed for nausea. 05/06/12   Pisciotta, Peterson Brandt, PA-C  oseltamivir  (TAMIFLU ) 75 MG capsule Take 1 capsule (75 mg total) by mouth every 12 (twelve) hours. 05/06/12   Pisciotta, Peterson Brandt, PA-C  RYBELSUS 3 MG TABS Take 2 tablets by mouth daily.    [provider]  thiamine (VITAMIN B-1) 100 MG tablet Take 100 mg by mouth daily.    [provider]  torsemide (DEMADEX) 20 MG tablet Take 20 mg by mouth daily.    [provider]  Past Surgical History Past Surgical History:  Procedure Laterality Date   TUBAL LIGATION     Family History History reviewed. No pertinent family history.  Social History Social History   Tobacco Use   Smoking status: Former    Types: Cigarettes  Vaping Use   Vaping status: Never Used  Substance Use Topics   Alcohol use: Never   Allergies Patient has no known allergies.  Review of Systems Review of Systems  Unable to perform ROS: Acuity of condition    Physical Exam Vital Signs  I have reviewed the triage vital signs BP (!) 152/80   Pulse 80   Temp 97.6 F (36.4 C)   Resp (!) 25   Ht 5\' 5"  (1.651 m)   Wt 60 kg   SpO2 100%   BMI 22.01 kg/m  Physical Exam Vitals and nursing note reviewed.  Constitutional:      General: She is in acute distress.     Appearance: She is well-developed.     Comments: Obtunded, chronically ill-appearing.   Initially with some generalized shaking activity  HENT:     Head: Normocephalic.     Comments: Swelling over the left face without erythema, wound    Mouth/Throat:     Mouth: Mucous membranes are moist.  Eyes:     Pupils: Pupils are equal, round, and reactive to light.     Comments: Initially with rightward gaze, normalized after Ativan  Cardiovascular:     Rate and Rhythm: Regular rhythm. Tachycardia present.     Heart sounds: No murmur heard. Pulmonary:     Comments: Diffuse coarse breath sounds.  Breath sounds equal.  No focal findings. Abdominal:     General: Abdomen is flat.     Palpations: Abdomen is soft.     Tenderness: There is no abdominal tenderness.  Musculoskeletal:        General: No tenderness.     Cervical back: Neck supple.     Right lower leg: Edema present.     Left lower leg: Edema present.     Comments: Left upper extremity fistula palpable with thrill  Skin:    General: Skin is warm and dry.  Neurological:     Comments: Obtunded, not following commands or reacting to painful stimuli.  No obvious facial droop.  Psychiatric:     Comments: Unable to assess     ED Results and Treatments Labs (all labs ordered are listed, but only abnormal results are displayed) Labs Reviewed  COMPREHENSIVE METABOLIC PANEL WITH GFR - Abnormal; Notable for the following components:      Result Value   Sodium 129 (*)    Chloride 91 (*)    Glucose, Bld 989 (*)    Creatinine, Ser 2.64 (*)    Calcium 8.6 (*)    Alkaline Phosphatase 136 (*)    GFR, Estimated 19 (*)    Anion gap 16 (*)    All other components within normal limits  CBC WITH DIFFERENTIAL/PLATELET - Abnormal; Notable for the following components:   WBC 13.0 (*)    Neutro Abs 11.4 (*)    All other components within normal limits  BLOOD GAS, VENOUS - Abnormal; Notable for the following components:   pH, Ven 7.08 (*)    pCO2, Ven 81 (*)    Acid-base deficit 7.1 (*)    All other components within normal  limits  LACTIC ACID, PLASMA - Abnormal; Notable for the following components:   Lactic Acid, Venous 2.8 (*)  All other components within normal limits  LACTIC ACID, PLASMA - Abnormal; Notable for the following components:   Lactic Acid, Venous 2.9 (*)    All other components within normal limits  BETA-HYDROXYBUTYRIC ACID - Abnormal; Notable for the following components:   Beta-Hydroxybutyric Acid 4.15 (*)    All other components within normal limits  URINALYSIS, W/ REFLEX TO CULTURE (INFECTION SUSPECTED) - Abnormal; Notable for the following components:   APPearance CLOUDY (*)    Glucose, UA >=500 (*)    Hgb urine dipstick MODERATE (*)    Ketones, ur 5 (*)    Protein, ur >=300 (*)    Leukocytes,Ua MODERATE (*)    Bacteria, UA MANY (*)    All other components within normal limits  RAPID URINE DRUG SCREEN, HOSP PERFORMED - Abnormal; Notable for the following components:   Cocaine POSITIVE (*)    Benzodiazepines POSITIVE (*)    All other components within normal limits  BLOOD GAS, ARTERIAL - Abnormal; Notable for the following components:   pCO2 arterial 29 (*)    pO2, Arterial 130 (*)    Bicarbonate 19.2 (*)    Acid-base deficit 4.7 (*)    All other components within normal limits  LACTIC ACID, PLASMA - Abnormal; Notable for the following components:   Lactic Acid, Venous 3.5 (*)    All other components within normal limits  CBG MONITORING, ED - Abnormal; Notable for the following components:   Glucose-Capillary >600 (*)    All other components within normal limits  CBG MONITORING, ED - Abnormal; Notable for the following components:   Glucose-Capillary >600 (*)    All other components within normal limits  CBG MONITORING, ED - Abnormal; Notable for the following components:   Glucose-Capillary >600 (*)    All other components within normal limits  CBG MONITORING, ED - Abnormal; Notable for the following components:   Glucose-Capillary >600 (*)    All other components  within normal limits  CBG MONITORING, ED - Abnormal; Notable for the following components:   Glucose-Capillary >600 (*)    All other components within normal limits  CBG MONITORING, ED - Abnormal; Notable for the following components:   Glucose-Capillary >600 (*)    All other components within normal limits  CBG MONITORING, ED - Abnormal; Notable for the following components:   Glucose-Capillary >600 (*)    All other components within normal limits  TROPONIN I (HIGH SENSITIVITY) - Abnormal; Notable for the following components:   Troponin I (High Sensitivity) 114 (*)    All other components within normal limits  TROPONIN I (HIGH SENSITIVITY) - Abnormal; Notable for the following components:   Troponin I (High Sensitivity) 136 (*)    All other components within normal limits  TROPONIN I (HIGH SENSITIVITY) - Abnormal; Notable for the following components:   Troponin I (High Sensitivity) 164 (*)    All other components within normal limits  URINE CULTURE  CULTURE, BLOOD (ROUTINE X 2)  CULTURE, BLOOD (ROUTINE X 2)  PROTIME-INR  CK  ETHANOL  MAGNESIUM  OSMOLALITY  LACTIC ACID, PLASMA  I-STAT CHEM 8, ED  TROPONIN I (HIGH SENSITIVITY)  Radiology CT Maxillofacial Wo Contrast Result Date: 09/24/2023 CLINICAL DATA:  Found down, seizure, facial swelling on earlier CT EXAM: CT MAXILLOFACIAL WITHOUT CONTRAST TECHNIQUE: Multidetector CT imaging of the maxillofacial structures was performed. Multiplanar CT image reconstructions were also generated. RADIATION DOSE REDUCTION: This exam was performed according to the departmental dose-optimization program which includes automated exposure control, adjustment of the mA and/or kV according to patient size and/or use of iterative reconstruction technique. COMPARISON:  09/24/2023 FINDINGS: Osseous: No fracture or mandibular dislocation.  No destructive process. Orbits: Negative. No traumatic or inflammatory finding. Sinuses: Small gas fluid level left maxillary sinus. Mild mucosal thickening within the sphenoid sinus. Soft tissues: Left periorbital soft tissue swelling. Subcutaneous edema is seen along the left side mandible and extending into the left neck. Limited intracranial: No significant or unexpected finding. IMPRESSION: 1. Left periorbital soft tissue swelling, as well as subcutaneous edema along the left submandibular region and left side neck. 2. No acute facial bone fracture. 3. Minimal left maxillary and sphenoid sinus disease. Electronically Signed   By: Bobbye Burrow M.D.   On: 09/24/2023 19:15   CT Cervical Spine Wo Contrast Result Date: 09/24/2023 CLINICAL DATA:  Left periorbital soft tissue swelling, facial trauma, found down, possible seizure EXAM: CT CERVICAL SPINE WITHOUT CONTRAST TECHNIQUE: Multidetector CT imaging of the cervical spine was performed without intravenous contrast. Multiplanar CT image reconstructions were also generated. RADIATION DOSE REDUCTION: This exam was performed according to the departmental dose-optimization program which includes automated exposure control, adjustment of the mA and/or kV according to patient size and/or use of iterative reconstruction technique. COMPARISON:  None Available. FINDINGS: Alignment: Alignment is grossly anatomic. Skull base and vertebrae: No acute fracture. No primary bone lesion or focal pathologic process. Soft tissues and spinal canal: No prevertebral fluid or swelling. No visible canal hematoma. Disc levels: Multilevel spondylosis and facet hypertrophy, most pronounced from C4-5 through C6-7. Circumferential disc bulge identified at C3-4, C4-5, C5-6, and C6-7. Changes are eccentric to the right at C3-4 and C4-5 with right greater than left neural foraminal narrowing. There is mild-to-moderate central canal stenosis at C4-5 and C5-6. Upper chest: Endotracheal and  enteric catheters are partially visualized, distal margins excluded by slice selection. Lung apices are clear. Other: Reconstructed images demonstrate no additional findings. IMPRESSION: 1. No acute cervical spine fracture. 2. Extensive cervical spondylosis and facet hypertrophy as above. Electronically Signed   By: Bobbye Burrow M.D.   On: 09/24/2023 19:12   MR BRAIN WO CONTRAST Result Date: 09/24/2023 CLINICAL DATA:  Mental status change EXAM: MRI HEAD WITHOUT CONTRAST TECHNIQUE: Multiplanar, multiecho pulse sequences of the brain and surrounding structures were obtained without intravenous contrast. COMPARISON:  Earlier same day head CT. FINDINGS: Brain: Punctate focus of mild diffusion signal abnormality and ADC signal abnormality in the subcortical white matter involving the left postcentral gyrus concerning for acute/early subacute infarct. No additional region of infarct noted. No evidence of intracranial hemorrhage. Mild T2/FLAIR signal abnormality in the periventricular and subcortical white matter. Mild parenchymal volume loss. No mass lesion or midline shift. Cerebellum is unremarkable. Normal appearance of midline structures. The basilar cisterns are patent. No extra-axial fluid collections. Ventricles: Prominence of the lateral ventricles suggestive of underlying parenchymal volume loss. Vascular: Skull base flow voids are visualized. Skull and upper cervical spine: Degenerative changes in the visualized upper cervical spine. Disc osteophyte complex at C3-4 indents the ventral thecal sac. Visualized calvarium is unremarkable. Sinuses/Orbits: Orbits are symmetric. Mild mucosal thickening throughout the paranasal sinuses. Additional more pronounced  mucosal thickening in the posterior and inferior left maxillary sinus. Other: Right mastoid effusion. Partially visualized left facial soft tissue swelling. IMPRESSION: Punctate focus of acute/early subacute infarct involving the subcortical white matter of  the left postcentral gyrus. Mild chronic microvascular ischemic changes and mild parenchymal volume loss. Paranasal sinus disease most pronounced in the left maxillary sinus. Right mastoid effusion. Partially visualized left facial soft tissue swelling. Electronically Signed   By: Denny Flack M.D.   On: 09/24/2023 17:54   DG Chest Portable 1 View Result Date: 09/24/2023 CLINICAL DATA:  et tube and og placement EXAM: PORTABLE CHEST - 1 VIEW COMPARISON:  September 24, 2023 4:14 p.m. FINDINGS: Endotracheal tube terminates in the mid trachea. Esophagogastric tube courses below the diaphragm terminating in the stomach. Elevation of the right hemidiaphragm. Streaky right basilar atelectasis. No focal airspace consolidation, pleural effusion, or pneumothorax. No cardiomegaly. Tortuous aorta with aortic atherosclerosis. No acute fracture or destructive lesion. Likely external jugular IV in the left neck. IMPRESSION: 1. Well-positioned endotracheal and esophagogastric tubes, as delineated above. No pneumothorax. 2. No significant interval change to the lungs. Electronically Signed   By: Rance Burrows M.D.   On: 09/24/2023 17:17   DG Chest Portable 1 View Result Date: 09/24/2023 CLINICAL DATA:  Found down, unresponsive, seizure EXAM: PORTABLE CHEST 1 VIEW COMPARISON:  07/18/2023 FINDINGS: Single frontal view of the chest demonstrates an unremarkable cardiac silhouette. Stable ectasia and atherosclerosis of the thoracic aorta. No acute airspace disease, effusion, or pneumothorax. No acute displaced fractures. IMPRESSION: 1. Stable chest, no acute process. Electronically Signed   By: Bobbye Burrow M.D.   On: 09/24/2023 16:41   CT HEAD CODE STROKE WO CONTRAST Result Date: 09/24/2023 CLINICAL DATA:  Code stroke. Neuro deficit, concern for stroke, found lying on floor this afternoon unresponsive possible seizure. EXAM: CT HEAD WITHOUT CONTRAST TECHNIQUE: Contiguous axial images were obtained from the base of the skull  through the vertex without intravenous contrast. RADIATION DOSE REDUCTION: This exam was performed according to the departmental dose-optimization program which includes automated exposure control, adjustment of the mA and/or kV according to patient size and/or use of iterative reconstruction technique. COMPARISON:  None Available. FINDINGS: Brain: No acute intracranial hemorrhage. No CT evidence of acute infarct. No edema, mass effect, or midline shift. The basilar cisterns are patent. Ventricles: The ventricles are normal. Vascular: Atherosclerotic calcifications of the carotid siphons. No hyperdense vessel. Skull: No acute or aggressive finding. Orbits: Orbits are symmetric. Sinuses: Mucosal thickening and air-fluid level in the left maxillary sinus. Other: Right mastoid effusion. Left periorbital soft tissue swelling and left facial soft tissue swelling over the maxillary sinus and zygomatic arch. ASPECTS [facility] Stroke Program Early CT Score) - Ganglionic level infarction (caudate, lentiform nuclei, internal capsule, insula, M1-M3 cortex): 7 - Supraganglionic infarction (M4-M6 cortex): 3 Total score (0-10 with 10 being normal): 10 IMPRESSION: 1. No CT evidence of acute intracranial abnormality. 2. Left periorbital and left facial soft tissue swelling is partially visualized. Consider CT maxillofacial for further evaluation of facial trauma if clinically indicated. 3. Right mastoid effusion. Recommend correlation with tenderness over the mastoid temporal bone. 4. ASPECTS is 10 These results were called by telephone at the time of interpretation on 09/24/2023 at 4:02 pm to provider Dr. Isaiah Marc, who verbally acknowledged these results. Electronically Signed   By: Denny Flack M.D.   On: 09/24/2023 16:07    Pertinent labs & imaging results that were available during my care of the patient were reviewed by me and  considered in my medical decision making (see MDM for details).  Medications Ordered in  ED Medications  propofol (DIPRIVAN) 1000 MG/100ML infusion (25 mcg/kg/min  60 kg Intravenous Rate/Dose Change 09/24/23 2016)  insulin  regular, human (MYXREDLIN) 100 units/ 100 mL infusion (7.5 Units/hr Intravenous Rate/Dose Verify 09/24/23 2113)  lactated ringers infusion ( Intravenous New Bag/Given 09/24/23 1909)  dextrose 5 % in lactated ringers infusion (0 mLs Intravenous Hold 09/24/23 1902)  dextrose 50 % solution 0-50 mL (has no administration in time range)  atorvastatin (LIPITOR) tablet 80 mg (has no administration in time range)  thiamine (VITAMIN B1) tablet 100 mg (has no administration in time range)  ipratropium-albuterol  (DUONEB) 0.5-2.5 (3) MG/3ML nebulizer solution 3 mL (has no administration in time range)  LORazepam (ATIVAN) 2 MG/ML injection (  Given 09/24/23 1658)  etomidate (AMIDATE) injection 18 mg (18 mg Intravenous Given 09/24/23 1701)  rocuronium (ZEMURON) injection 60 mg (60 mg Intravenous Given 09/24/23 1701)  lactated ringers bolus 1,200 mL (0 mLs Intravenous Stopped 09/24/23 1907)  potassium chloride  10 mEq in 100 mL IVPB (0 mEq Intravenous Stopped 09/24/23 1938)  levETIRAcetam (KEPPRA) 2,000 mg in sodium chloride  0.9 % 250 mL IVPB (0 mg Intravenous Stopped 09/24/23 1807)  cefTRIAXone (ROCEPHIN) 1 g in sodium chloride  0.9 % 100 mL IVPB (0 g Intravenous Stopped 09/24/23 2010)                                                                                                                                     Procedures .Critical Care  Performed by: Mordecai Applebaum, MD Authorized by: Mordecai Applebaum, MD   Critical care provider statement:    Critical care time (minutes):  50   Critical care was necessary to treat or prevent imminent or life-threatening deterioration of the following conditions:  Metabolic crisis and CNS failure or compromise   Critical care was time spent personally by me on the following activities:  Development of treatment plan with patient or  surrogate, discussions with consultants, evaluation of patient's response to treatment, examination of patient, ordering and review of laboratory studies, ordering and review of radiographic studies, ordering and performing treatments and interventions, pulse oximetry, re-evaluation of patient's condition and review of old charts   Care discussed with: admitting provider and accepting provider at another facility   Procedure Name: Intubation Date/Time: 09/24/2023 5:49 PM  Performed by: Mordecai Applebaum, MDPre-anesthesia Checklist: Patient identified, Patient being monitored, Emergency Drugs available, Timeout performed and Suction available Oxygen Delivery Method: Non-rebreather mask Preoxygenation: Pre-oxygenation with 100% oxygen Induction Type: Rapid sequence Ventilation: Mask ventilation without difficulty Laryngoscope Size: Mac and 4 Grade View: Grade I Tube size: 7.5 mm Number of attempts: 1 Placement Confirmation: ETT inserted through vocal cords under direct vision, CO2 detector and Breath sounds checked- equal and bilateral Secured at: 23 cm Tube secured with: ETT holder    Angiocath insertion  Date/Time: 09/24/2023 5:50 PM  Performed by: Mordecai Applebaum, MD Authorized by: Mordecai Applebaum, MD  Consent: The procedure was performed in an emergent situation. Preparation: Patient was prepped and draped in the usual sterile fashion. Local anesthesia used: no  Anesthesia: Local anesthesia used: no  Sedation: Patient sedated: no  Comments: Left external jugular vein     (including critical care time)  Medical Decision Making / ED Course   MDM:  68 year old presenting with altered mental status/concern for seizure/code stroke.  Patient obtunded, chronically ill-appearing, in acute distress.  Initially seem to have some seizure activity with right gaze deviation.  Patient received Versed with EMS and then another 2 mg of Ativan in the emergency department  with resolution of shaking and normalization of gaze.  Also loaded with Keppra.  Given code stroke patient was discussed with neurology who recommends obtaining MRI.  CT head without evidence of bleeding.  Radiology recommends CT face given facial swelling to evaluate for any underlying injury.  Will order this.  VBG notable for hypercapnia.  Patient obtunded not able to tolerate BiPAP.  pH is also low.  She will need to be intubated.  Unclear cause of her seizure, could be toxic or metabolic in nature, due to stroke, intracranial lesion not seen on CT scan, cocaine abuse.  Patient afebrile in the emergency department.  Lower concern for CNS infection.  Patient will need to be admitted.  Clinical Course as of 09/24/23 2113  Tue Sep 24, 2023  1739 Patient accepted by Dr. Marene Shape  [WS]  1746 MRI pending, per neurology, no signs of stroke.  Pending formal read.  Laboratory workup notable for significant hyperglycemia, mild anion gap.  Suspect probably more HHS.  Will start on insulin .  Potassium borderline so we will give some runs of potassium also.  Patient has received Keppra.  She will be maintained on propofol for there is possible seizure care.  Attempted to call family however patient's home phone goes to a nursing facility and her niece's number is disconnected.  Discussed with ICU physician Dr. Marene Shape who has accepted patient for admission to ICU at [facility]. Bed assigned. Pt remains stable [WS]  1902 UA shows signs of UTI. Will order CTX. Culture sent. ABG shows improvement in pH and hypercapnea s/p intubation  [WS]  1928 COCAINE(!): POSITIVE [WS]  2111 Updated patient's daughter regarding patient status. Will be transferring to [facility] soon.  [WS]    Clinical Course User Index [WS] Mordecai Applebaum, MD     Additional history obtained: -Additional history obtained from family and ems -External records from outside source obtained and reviewed including: Chart review including previous  notes, labs, imaging, consultation notes including prior notes    Lab Tests: -I ordered, reviewed, and interpreted labs.   The pertinent results include:   Labs Reviewed  COMPREHENSIVE METABOLIC PANEL WITH GFR - Abnormal; Notable for the following components:      Result Value   Sodium 129 (*)    Chloride 91 (*)    Glucose, Bld 989 (*)    Creatinine, Ser 2.64 (*)    Calcium 8.6 (*)    Alkaline Phosphatase 136 (*)    GFR, Estimated 19 (*)    Anion gap 16 (*)    All other components within normal limits  CBC WITH DIFFERENTIAL/PLATELET - Abnormal; Notable for the following components:   WBC 13.0 (*)    Neutro Abs 11.4 (*)    All other components within normal limits  BLOOD GAS, VENOUS -  Abnormal; Notable for the following components:   pH, Ven 7.08 (*)    pCO2, Ven 81 (*)    Acid-base deficit 7.1 (*)    All other components within normal limits  LACTIC ACID, PLASMA - Abnormal; Notable for the following components:   Lactic Acid, Venous 2.8 (*)    All other components within normal limits  LACTIC ACID, PLASMA - Abnormal; Notable for the following components:   Lactic Acid, Venous 2.9 (*)    All other components within normal limits  BETA-HYDROXYBUTYRIC ACID - Abnormal; Notable for the following components:   Beta-Hydroxybutyric Acid 4.15 (*)    All other components within normal limits  URINALYSIS, W/ REFLEX TO CULTURE (INFECTION SUSPECTED) - Abnormal; Notable for the following components:   APPearance CLOUDY (*)    Glucose, UA >=500 (*)    Hgb urine dipstick MODERATE (*)    Ketones, ur 5 (*)    Protein, ur >=300 (*)    Leukocytes,Ua MODERATE (*)    Bacteria, UA MANY (*)    All other components within normal limits  RAPID URINE DRUG SCREEN, HOSP PERFORMED - Abnormal; Notable for the following components:   Cocaine POSITIVE (*)    Benzodiazepines POSITIVE (*)    All other components within normal limits  BLOOD GAS, ARTERIAL - Abnormal; Notable for the following components:    pCO2 arterial 29 (*)    pO2, Arterial 130 (*)    Bicarbonate 19.2 (*)    Acid-base deficit 4.7 (*)    All other components within normal limits  LACTIC ACID, PLASMA - Abnormal; Notable for the following components:   Lactic Acid, Venous 3.5 (*)    All other components within normal limits  CBG MONITORING, ED - Abnormal; Notable for the following components:   Glucose-Capillary >600 (*)    All other components within normal limits  CBG MONITORING, ED - Abnormal; Notable for the following components:   Glucose-Capillary >600 (*)    All other components within normal limits  CBG MONITORING, ED - Abnormal; Notable for the following components:   Glucose-Capillary >600 (*)    All other components within normal limits  CBG MONITORING, ED - Abnormal; Notable for the following components:   Glucose-Capillary >600 (*)    All other components within normal limits  CBG MONITORING, ED - Abnormal; Notable for the following components:   Glucose-Capillary >600 (*)    All other components within normal limits  CBG MONITORING, ED - Abnormal; Notable for the following components:   Glucose-Capillary >600 (*)    All other components within normal limits  CBG MONITORING, ED - Abnormal; Notable for the following components:   Glucose-Capillary >600 (*)    All other components within normal limits  TROPONIN I (HIGH SENSITIVITY) - Abnormal; Notable for the following components:   Troponin I (High Sensitivity) 114 (*)    All other components within normal limits  TROPONIN I (HIGH SENSITIVITY) - Abnormal; Notable for the following components:   Troponin I (High Sensitivity) 136 (*)    All other components within normal limits  TROPONIN I (HIGH SENSITIVITY) - Abnormal; Notable for the following components:   Troponin I (High Sensitivity) 164 (*)    All other components within normal limits  URINE CULTURE  CULTURE, BLOOD (ROUTINE X 2)  CULTURE, BLOOD (ROUTINE X 2)  PROTIME-INR  CK  ETHANOL   MAGNESIUM  OSMOLALITY  LACTIC ACID, PLASMA  I-STAT CHEM 8, ED  TROPONIN I (HIGH SENSITIVITY)    Notable for  elevated troponin likely demand related, hyperglycemia, uds+ cocaine, UTI, hypercapnea, CKD, pseudohyponatremia   EKG   EKG Interpretation Date/Time:  Tuesday September 24 2023 15:27:55 EDT Ventricular Rate:  109 PR Interval:  157 QRS Duration:  98 QT Interval:  382 QTC Calculation: 515 R Axis:   -36  Text Interpretation: Sinus tachycardia Left axis deviation Abnormal R-wave progression, late transition Abnormal T, consider ischemia, lateral leads Prolonged QT interval Confirmed by Hiawatha Lout ([id number]) on 09/24/2023 4:35:35 PM         Imaging Studies ordered: I ordered imaging studies including MRI, CXR On my interpretation imaging demonstrates ?tiny acute stroke vs artifact  I independently visualized and interpreted imaging. I agree with the radiologist interpretation   Medicines ordered and prescription drug management: Meds ordered this encounter  Medications   LORazepam (ATIVAN) 2 MG/ML injection    Talbott, Tina S: cabinet override   DISCONTD: levETIRAcetam (KEPPRA) 1000 MG/100ML IVPB    Belton, Kristy A: cabinet override   DISCONTD: lactated ringers bolus 1,000 mL   DISCONTD: levETIRAcetam (KEPPRA) IVPB 1000 mg/100 mL premix   etomidate (AMIDATE) injection 18 mg   rocuronium (ZEMURON) injection 60 mg   propofol (DIPRIVAN) 1000 MG/100ML infusion   DISCONTD: propofol (DIPRIVAN) 10 mg/mL bolus/IV push    Steele, Dawn M: cabinet override   propofol (DIPRIVAN) 1000 MG/100ML infusion    Steele, Dawn M: cabinet override   lactated ringers bolus 1,200 mL   insulin  regular, human (MYXREDLIN) 100 units/ 100 mL infusion    EndoTool Goal Range::   140-180    Type of Diabetes:   Type 2    Mode of Therapy:   ENDOX1 for DKA    Start Method:   EndoTool to calculate   lactated ringers infusion   dextrose 5 % in lactated ringers infusion   dextrose 50 % solution  0-50 mL   potassium chloride  10 mEq in 100 mL IVPB   levETIRAcetam (KEPPRA) 2,000 mg in sodium chloride  0.9 % 250 mL IVPB   cefTRIAXone (ROCEPHIN) 1 g in sodium chloride  0.9 % 100 mL IVPB    Antibiotic Indication::   UTI   atorvastatin (LIPITOR) tablet 80 mg   thiamine (VITAMIN B1) tablet 100 mg   ipratropium-albuterol  (DUONEB) 0.5-2.5 (3) MG/3ML nebulizer solution 3 mL    -I have reviewed the patients home medicines and have made adjustments as needed   Consultations Obtained: I requested consultation with the neurologist,  and discussed lab and imaging findings as well as pertinent plan - they recommend: admit to ICU, improve metabolic derangements   Cardiac Monitoring: The patient was maintained on a cardiac monitor.  I personally viewed and interpreted the cardiac monitored which showed an underlying rhythm of: NSR  Social Determinants of Health:  Diagnosis or treatment significantly limited by social determinants of health: polysubstance abuse   Reevaluation: After the interventions noted above, I reevaluated the patient and found that their symptoms have improved  Co morbidities that complicate the patient evaluation  Past Medical History:  Diagnosis Date   Asthma    Diabetes mellitus without complication (HCC)    Hypertension       Dispostion: Disposition decision including need for hospitalization was considered, and patient admitted to the hospital.    Final Clinical Impression(s) / ED Diagnoses Final diagnoses:  Seizure (HCC)  Hyperosmolar hyperglycemic state (HHS) (HCC)  Lactic acidosis  Acute hypercapnic respiratory failure (HCC)     This chart was dictated using voice recognition software.  Despite best efforts  to proofread,  errors can occur which can change the documentation meaning.    Mordecai Applebaum, MD 09/24/23 2113

## 2023-09-24 NOTE — ED Notes (Signed)
 Pts daughter at bedside requesting update, MD aware.

## 2023-09-24 NOTE — Progress Notes (Signed)
 eLink Physician-Brief Progress Note Patient Name: Lori Gates DOB: 01-31-1956 MRN: [id number]   Date of Service  09/24/2023  HPI/Events of Note  Has arrived from [facility]. ON IV LR and insulin  infusions. Seizures and resp failure per notes. ON sedation. ON vent support VT 450 RR 24, O2 60 and peep 5. Stable vitals and no distress on camera  eICU Interventions  BMP and LA now and serial checks ordered Ground team notified and will evaluate DW RN      Intervention Category Major Interventions: Respiratory failure - evaluation and management;Seizures - evaluation and management Evaluation Type: New Patient Evaluation  Luiz Sakai 09/24/2023, 11:11 PM

## 2023-09-24 NOTE — ED Notes (Signed)
 Called carelink with bed assignment. Lori Gates

## 2023-09-24 NOTE — H&P (Signed)
 NAME:  Lori Gates, MRN:  [id number], DOB:  [birth year], LOS: 0 ADMISSION DATE:  09/24/2023, CONSULTATION DATE:  4/29 REFERRING MD:  Dr. Lincoln Renshaw, CHIEF COMPLAINT:  seizures   History of Present Illness:  68 year old female w/ poorly controlled  IDDM, CKD stage IV, HTN, LLD, HFpEF, substance abuse (cocaine) followed by nephrology in [place].  Hx from ED: Daughter found on floor around 1500 actively seizing, CBG read "high". Continued to seize from time of EMS dispatch to arrival to ER. Pt had received 2.5 mg versed in route. On arrival had right facial twitching, right gaze pref. Received 2 mg IV ativan. After this gaze pref returned to midline w/ snoring respirations, no further twitching, would grimace to noxious stim.  CT head negative for acute bleed, there was left periorbital facial swelling.  MRI neg for acute process per neuro.  Intubated by EDP for airway protection  Given IV keppra (total 2 gms)  Initial lab wk: Na 129, glucose 989, anion gap 16, wbc 13, CK 121, trop 114, VBG Ph 7.08, pco2 81, bicarb 24, lactate 2.8-->2.9 Post intubation abg: 7.42/29/130/19.2 PCXR ett good position. 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 Down time not clear CBG "high" seized from EMS notification to arrival to ED> CT and MRI brain neg for acute bleed. Loaded 2 gm keppra. Intubated airway protection   Interim History /  Subjective:  Patient sedate on prop On insulin  drip Intubated  Objective   Blood pressure (!) 173/64, pulse (!) 110, temperature 98.1 F (36.7 C), temperature source Oral, resp. rate (!) 21, height 5\' 5"  (1.651 m), weight 60 kg, SpO2 99%.    Vent Mode: PRVC FiO2 (%):  [60 %] 60 % Set Rate:  [24 bmp] 24 bmp Vt Set:  [450 mL] 450 mL PEEP:  [5 cmH20] 5 cmH20 Plateau Pressure:  [20 cmH20] 20 cmH20  No intake or output data in the 24 hours ending 09/24/23 1750 Filed Weights   09/24/23 1526  Weight: 60 kg    Examination: General: critically ill appearing on mech vent HEENT: MM pink/moist; ETT in place Neuro: sedate; perrl CV: s1s2, RRR, no m/r/g PULM:  dim clear BS bilaterally; on mech vent PRVC GI: soft, bsx4 active  Extremities: warm/dry, LUE fistula; LUE more swelling than RUE Skin: no rashes or lesions    Resolved Hospital Problem list     Assessment & Plan:  Acute metabolic encephalopathy s/p seizure  -mixed picture of post ictal state and metabolic derangements; UDS positive for cocaine and benzos Hx of cocaine use Plan: -Neuro following; appreciate recs -They are recommending holding further AEDs -Treat metabolic derangements -LTM pending -limit sedating meds -Continue neuroprotective measures- normothermia, euglycemia, HOB greater than 30, head in neutral alignment, normocapnia, normoxia -check ammonia  Acute hypercarbic resp failure s/p seizure.  Does have some right basilar vol loss. Chronically elevated Right HD Plan: -CXR on arrival -ABG -Full vent support -VAP bundle -  Resp culture  -PAD protocol   HHS vs. DKA Hx of IDDM Plan: -Insulin  per endotool -CBG monitoring -Aggressive IV fluid resuscitation -trend B-Hydroxy -osmolality pending -serial bmp -check A1c  AKI on CKD 4 Hyponatremia Plan: -iv fluids -Trend BMP / urinary output -Replace electrolytes as indicated -Avoid nephrotoxic agents, ensure adequate renal  perfusion  UTI Sepsis Plan: -rocephin -follow cultures -iv fluids -trend LA  Elevated troponin Plan: -likely demand -trend  LUE swelling Plan: -US  LUE  HTN HLD HFpEF Plan: -hold home anti-htn meds; avoid BB w/ hx of cocain use -statin -hold diuretics given appears dehyrated    Best Practice (right click and "Reselect all SmartList Selections" daily)   Diet/type: NPO w/ meds via tube DVT prophylaxis prophylactic heparin  Pressure ulcer(s): N/A GI prophylaxis: H2B Lines: N/A Foley:  Yes, and it is still needed Code Status:  full code Last date of multidisciplinary goals of care discussion [4/30 updated grandson over phone. States full code]  Labs   CBC: Recent Labs  Lab 09/24/23 1534  WBC 13.0*  NEUTROABS 11.4*  HGB 12.0  HCT 38.7  MCV 100.0  PLT 277    Basic Metabolic Panel: Recent Labs  Lab 09/24/23 1534 09/24/23 1537  NA 129*  --   K 3.5  --   CL 91*  --   CO2 22  --   GLUCOSE 989*  --   BUN 19  --   CREATININE 2.64*  --   CALCIUM 8.6*  --   MG  --  2.2   GFR: Estimated Creatinine Clearance: 18.4 mL/min (A) (by C-G formula based on SCr of 2.64 mg/dL (H)). Recent Labs  Lab 09/24/23 1534 09/24/23 1601  WBC 13.0*  --   LATICACIDVEN  --  2.8*    Liver Function Tests: Recent Labs  Lab 09/24/23 1534  AST 19  ALT 12  ALKPHOS 136*  BILITOT 0.6  PROT 7.6  ALBUMIN 3.6   No results for input(s): "LIPASE", "AMYLASE" in the last 168 hours. No results for input(s): "AMMONIA" in the last 168 hours.  ABG    Component Value Date/Time   HCO3 24.3 09/24/2023 1535   ACIDBASEDEF 7.1 (H) 09/24/2023 1535   O2SAT 59.9 09/24/2023 1535     Coagulation Profile: Recent Labs  Lab 09/24/23 1534  INR 1.0    Cardiac Enzymes: Recent Labs  Lab 09/24/23 1534  CKTOTAL 121    HbA1C: No results found for: "HGBA1C"  CBG: Recent Labs  Lab 09/24/23 1714  GLUCAP >600*    Review of Systems:   Patient is sedate and/or intubated;  therefore, history has been obtained from chart review.    Past Medical History:  She,  has a past medical history of Asthma, Diabetes mellitus without complication (HCC), and Hypertension.   Surgical History:   Past Surgical History:  Procedure Laterality Date   TUBAL LIGATION       Social History:   reports that she has quit smoking. Her smoking use included cigarettes. She does not have any smokeless tobacco history on file. She reports that she does not drink alcohol.   Family History:  Her family history is not on file.   Allergies No Known Allergies   Home Medications  Prior to Admission medications   Medication Sig Start Date End Date Taking? Authorizing Provider  albuterol -ipratropium (COMBIVENT) 18-103 MCG/ACT inhaler Inhale 2 puffs into the lungs every 6 (six) hours as needed. For shortness of breath    [provider]  amLODipine (  NORVASC) 5 MG tablet Take 5 mg by mouth daily.    [provider]  atorvastatin (LIPITOR) 80 MG tablet Take 80 mg by mouth daily.    [provider]  ibuprofen (ADVIL,MOTRIN) 200 MG tablet Take 400 mg by mouth every 6 (six) hours as needed. For pain    [provider]  insulin  aspart protamine- aspart (NOVOLOG  MIX 70/30) (70-30) 100 UNIT/ML injection Inject 0.15 mLs (15 Units total) into the skin daily with breakfast. 05/27/21   Angelyn Kennel M, PA-C  insulin  aspart protamine- aspart (NOVOLOG  MIX 70/30) (70-30) 100 UNIT/ML injection Inject 0.15 mLs (15 Units total) into the skin daily with breakfast. 05/28/21   Wynetta Heckle, MD  insulin  lispro (HUMALOG) 100 UNIT/ML KwikPen Inject 13 Units into the skin 3 (three) times daily. 07/14/23   [provider]  LANTUS SOLOSTAR 100 UNIT/ML Solostar Pen Inject 20 Units into the skin at bedtime.    [provider]  nebivolol (BYSTOLIC) 2.5 MG tablet Take 2.5 mg by mouth daily. 07/14/23   [provider]  ondansetron  (ZOFRAN ) 4 MG tablet Take 1  tablet (4 mg total) by mouth every 8 (eight) hours as needed for nausea. 05/06/12   Pisciotta, Peterson Brandt, PA-C  oseltamivir  (TAMIFLU ) 75 MG capsule Take 1 capsule (75 mg total) by mouth every 12 (twelve) hours. 05/06/12   Pisciotta, Peterson Brandt, PA-C  RYBELSUS 3 MG TABS Take 2 tablets by mouth daily.    [provider]  thiamine (VITAMIN B-1) 100 MG tablet Take 100 mg by mouth daily.    [provider]  torsemide (DEMADEX) 20 MG tablet Take 20 mg by mouth daily.    [provider]     Critical care time: 50 minutes      JD Vira [facility] 09/24/2023, 7:44 PM  Please see [email or web address] for pager details.  From 7A-7P if no response, please call [phone]. After hours, please call ELink [phone].

## 2023-09-24 NOTE — ED Notes (Addendum)
 EDP stated he is unable to contact any family. 1800hrs 1600hrs MRI but unable d/t can't ask questions 1605 keppra finished 1620 xray for mri 1625 back in MRI  Left side of face and eye swollen upon arrival to room around 1550 Fistula noted to left arm - restrictions apply.

## 2023-09-24 NOTE — ED Notes (Signed)
 1525  Code stroke alert activation.  To ED via EMS with c/o altered mental status and seizure activity.  LKW 0930 per family member.    1527  Dr. Cleone Dad paged  1532  Dr. Cleone Dad on camera  1543  pt to CT  1552  Dr. Cleone Dad stated she would speak with the ED provider and discuss plan of care.  Telestroke nurse dismissed.
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
## 2023-09-24 NOTE — ED Notes (Signed)
Report given to [facility]  

## 2023-09-25 ENCOUNTER — Encounter (HOSPITAL_COMMUNITY)

## 2023-09-25 ENCOUNTER — Inpatient Hospital Stay (HOSPITAL_COMMUNITY)

## 2023-09-25 DIAGNOSIS — E1165 Type 2 diabetes mellitus with hyperglycemia: Secondary | ICD-10-CM

## 2023-09-25 DIAGNOSIS — E1111 Type 2 diabetes mellitus with ketoacidosis with coma: Secondary | ICD-10-CM

## 2023-09-25 DIAGNOSIS — R569 Unspecified convulsions: Secondary | ICD-10-CM | POA: Diagnosis not present

## 2023-09-25 DIAGNOSIS — M7989 Other specified soft tissue disorders: Secondary | ICD-10-CM | POA: Diagnosis not present

## 2023-09-25 DIAGNOSIS — G9340 Encephalopathy, unspecified: Secondary | ICD-10-CM | POA: Diagnosis present

## 2023-09-25 DIAGNOSIS — F1490 Cocaine use, unspecified, uncomplicated: Secondary | ICD-10-CM

## 2023-09-25 LAB — CBC WITH DIFFERENTIAL/PLATELET
Abs Immature Granulocytes: 0 10*3/uL (ref 0.00–0.07)
Basophils Absolute: 0 10*3/uL (ref 0.0–0.1)
Basophils Relative: 0 %
Eosinophils Absolute: 0 10*3/uL (ref 0.0–0.5)
Eosinophils Relative: 0 %
HCT: 38.2 % (ref 36.0–46.0)
Hemoglobin: 12.7 g/dL (ref 12.0–15.0)
Lymphocytes Relative: 23 %
Lymphs Abs: 2 10*3/uL (ref 0.7–4.0)
MCH: 31.4 pg (ref 26.0–34.0)
MCHC: 33.2 g/dL (ref 30.0–36.0)
MCV: 94.3 fL (ref 80.0–100.0)
Monocytes Absolute: 0.3 10*3/uL (ref 0.1–1.0)
Monocytes Relative: 4 %
Neutro Abs: 6.3 10*3/uL (ref 1.7–7.7)
Neutrophils Relative %: 73 %
Platelets: 235 10*3/uL (ref 150–400)
RBC: 4.05 MIL/uL (ref 3.87–5.11)
RDW: 12 % (ref 11.5–15.5)
WBC: 8.6 10*3/uL (ref 4.0–10.5)
nRBC: 0 % (ref 0.0–0.2)
nRBC: 0 /100{WBCs}

## 2023-09-25 LAB — BETA-HYDROXYBUTYRIC ACID
Beta-Hydroxybutyric Acid: 0.83 mmol/L — ABNORMAL HIGH (ref 0.05–0.27)
Beta-Hydroxybutyric Acid: 0.43 mmol/L — ABNORMAL HIGH (ref 0.05–0.27)
Beta-Hydroxybutyric Acid: 0.36 mmol/L — ABNORMAL HIGH (ref 0.05–0.27)
Beta-Hydroxybutyric Acid: 0.19 mmol/L (ref 0.05–0.27)

## 2023-09-25 LAB — POCT I-STAT 7, (LYTES, BLD GAS, ICA,H+H)
Acid-base deficit: 4 mmol/L — ABNORMAL HIGH (ref 0.0–2.0)
Bicarbonate: 20.2 mmol/L (ref 20.0–28.0)
Calcium, Ion: 1.16 mmol/L (ref 1.15–1.40)
HCT: 26 % — ABNORMAL LOW (ref 36.0–46.0)
Hemoglobin: 8.8 g/dL — ABNORMAL LOW (ref 12.0–15.0)
O2 Saturation: 99 %
Patient temperature: 36.4
Potassium: 3 mmol/L — ABNORMAL LOW (ref 3.5–5.1)
Sodium: 137 mmol/L (ref 135–145)
TCO2: 21 mmol/L — ABNORMAL LOW (ref 22–32)
pCO2 arterial: 32 mmHg (ref 32–48)
pH, Arterial: 7.407 (ref 7.35–7.45)
pO2, Arterial: 127 mmHg — ABNORMAL HIGH (ref 83–108)

## 2023-09-25 LAB — BASIC METABOLIC PANEL WITH GFR
Anion gap: 14 (ref 5–15)
Anion gap: 15 (ref 5–15)
Anion gap: 16 — ABNORMAL HIGH (ref 5–15)
Anion gap: 16 — ABNORMAL HIGH (ref 5–15)
Anion gap: 18 — ABNORMAL HIGH (ref 5–15)
BUN: 18 mg/dL (ref 8–23)
BUN: 18 mg/dL (ref 8–23)
BUN: 19 mg/dL (ref 8–23)
BUN: 19 mg/dL (ref 8–23)
BUN: 19 mg/dL (ref 8–23)
CO2: 18 mmol/L — ABNORMAL LOW (ref 22–32)
CO2: 19 mmol/L — ABNORMAL LOW (ref 22–32)
CO2: 20 mmol/L — ABNORMAL LOW (ref 22–32)
CO2: 21 mmol/L — ABNORMAL LOW (ref 22–32)
CO2: 21 mmol/L — ABNORMAL LOW (ref 22–32)
Calcium: 8.3 mg/dL — ABNORMAL LOW (ref 8.9–10.3)
Calcium: 8.4 mg/dL — ABNORMAL LOW (ref 8.9–10.3)
Calcium: 8.4 mg/dL — ABNORMAL LOW (ref 8.9–10.3)
Calcium: 8.4 mg/dL — ABNORMAL LOW (ref 8.9–10.3)
Calcium: 8.8 mg/dL — ABNORMAL LOW (ref 8.9–10.3)
Chloride: 102 mmol/L (ref 98–111)
Chloride: 103 mmol/L (ref 98–111)
Chloride: 104 mmol/L (ref 98–111)
Chloride: 104 mmol/L (ref 98–111)
Chloride: 97 mmol/L — ABNORMAL LOW (ref 98–111)
Creatinine, Ser: 2.58 mg/dL — ABNORMAL HIGH (ref 0.44–1.00)
Creatinine, Ser: 2.66 mg/dL — ABNORMAL HIGH (ref 0.44–1.00)
Creatinine, Ser: 2.72 mg/dL — ABNORMAL HIGH (ref 0.44–1.00)
Creatinine, Ser: 2.73 mg/dL — ABNORMAL HIGH (ref 0.44–1.00)
Creatinine, Ser: 2.77 mg/dL — ABNORMAL HIGH (ref 0.44–1.00)
GFR, Estimated: 18 mL/min — ABNORMAL LOW (ref >60)
GFR, Estimated: 18 mL/min — ABNORMAL LOW (ref >60)
GFR, Estimated: 18 mL/min — ABNORMAL LOW (ref >60)
GFR, Estimated: 19 mL/min — ABNORMAL LOW (ref >60)
GFR, Estimated: 20 mL/min — ABNORMAL LOW (ref >60)
Glucose, Bld: 152 mg/dL — ABNORMAL HIGH (ref 70–99)
Glucose, Bld: 165 mg/dL — ABNORMAL HIGH (ref 70–99)
Glucose, Bld: 251 mg/dL — ABNORMAL HIGH (ref 70–99)
Glucose, Bld: 431 mg/dL — ABNORMAL HIGH (ref 70–99)
Glucose, Bld: 723 mg/dL (ref 70–99)
Potassium: 3.1 mmol/L — ABNORMAL LOW (ref 3.5–5.1)
Potassium: 3.3 mmol/L — ABNORMAL LOW (ref 3.5–5.1)
Potassium: 3.4 mmol/L — ABNORMAL LOW (ref 3.5–5.1)
Potassium: 4.1 mmol/L (ref 3.5–5.1)
Potassium: 4.4 mmol/L (ref 3.5–5.1)
Sodium: 136 mmol/L (ref 135–145)
Sodium: 137 mmol/L (ref 135–145)
Sodium: 138 mmol/L (ref 135–145)
Sodium: 138 mmol/L (ref 135–145)
Sodium: 139 mmol/L (ref 135–145)

## 2023-09-25 LAB — URINE CULTURE: Culture: >=100000 — AB

## 2023-09-25 LAB — GLUCOSE, CAPILLARY
Glucose-Capillary: 522 mg/dL (ref 70–99)
Glucose-Capillary: 524 mg/dL (ref 70–99)
Glucose-Capillary: 439 mg/dL — ABNORMAL HIGH (ref 70–99)
Glucose-Capillary: 370 mg/dL — ABNORMAL HIGH (ref 70–99)
Glucose-Capillary: 286 mg/dL — ABNORMAL HIGH (ref 70–99)
Glucose-Capillary: 233 mg/dL — ABNORMAL HIGH (ref 70–99)
Glucose-Capillary: 175 mg/dL — ABNORMAL HIGH (ref 70–99)
Glucose-Capillary: 179 mg/dL — ABNORMAL HIGH (ref 70–99)
Glucose-Capillary: 137 mg/dL — ABNORMAL HIGH (ref 70–99)
Glucose-Capillary: 127 mg/dL — ABNORMAL HIGH (ref 70–99)
Glucose-Capillary: 138 mg/dL — ABNORMAL HIGH (ref 70–99)
Glucose-Capillary: 136 mg/dL — ABNORMAL HIGH (ref 70–99)
Glucose-Capillary: 132 mg/dL — ABNORMAL HIGH (ref 70–99)
Glucose-Capillary: 125 mg/dL — ABNORMAL HIGH (ref 70–99)
Glucose-Capillary: 121 mg/dL — ABNORMAL HIGH (ref 70–99)
Glucose-Capillary: 111 mg/dL — ABNORMAL HIGH (ref 70–99)
Glucose-Capillary: 130 mg/dL — ABNORMAL HIGH (ref 70–99)
Glucose-Capillary: 109 mg/dL — ABNORMAL HIGH (ref 70–99)
Glucose-Capillary: 132 mg/dL — ABNORMAL HIGH (ref 70–99)

## 2023-09-25 LAB — HEMOGLOBIN A1C
Hgb A1c MFr Bld: 14.3 % — ABNORMAL HIGH (ref 4.8–5.6)
Mean Plasma Glucose: 363.71 mg/dL

## 2023-09-25 LAB — CBC
HCT: 32.8 % — ABNORMAL LOW (ref 36.0–46.0)
Hemoglobin: 11.3 g/dL — ABNORMAL LOW (ref 12.0–15.0)
MCH: 32.1 pg (ref 26.0–34.0)
MCHC: 34.5 g/dL (ref 30.0–36.0)
MCV: 93.2 fL (ref 80.0–100.0)
Platelets: 138 10*3/uL — ABNORMAL LOW (ref 150–400)
RBC: 3.52 MIL/uL — ABNORMAL LOW (ref 3.87–5.11)
RDW: 12.3 % (ref 11.5–15.5)
WBC: 6.2 10*3/uL (ref 4.0–10.5)
nRBC: 0 % (ref 0.0–0.2)

## 2023-09-25 LAB — TROPONIN I (HIGH SENSITIVITY)
Troponin I (High Sensitivity): 239 ng/L (ref <18)
Troponin I (High Sensitivity): 232 ng/L (ref <18)

## 2023-09-25 LAB — CBG MONITORING, ED: Glucose-Capillary: >600 mg/dL (ref 70–99)

## 2023-09-25 LAB — AMMONIA: Ammonia: 28 umol/L (ref 9–35)

## 2023-09-25 LAB — LACTIC ACID, PLASMA
Lactic Acid, Venous: 5.5 mmol/L (ref 0.5–1.9)
Lactic Acid, Venous: 6.6 mmol/L (ref 0.5–1.9)

## 2023-09-25 LAB — MAGNESIUM: Magnesium: 1.8 mg/dL (ref 1.7–2.4)

## 2023-09-25 LAB — MRSA NEXT GEN BY PCR, NASAL: MRSA by PCR Next Gen: NOT DETECTED

## 2023-09-25 LAB — HIV ANTIBODY (ROUTINE TESTING W REFLEX): HIV Screen 4th Generation wRfx: NONREACTIVE

## 2023-09-25 MED ORDER — CHLORHEXIDINE GLUCONATE CLOTH 2 % EX PADS
6.0000 | MEDICATED_PAD | Freq: Every day | CUTANEOUS | Status: DC
  Administered 2023-09-26 – 2023-10-19 (×22): 6 via TOPICAL

## 2023-09-25 MED ORDER — POTASSIUM CHLORIDE CRYS ER 20 MEQ PO TBCR: 40.0000 meq | EXTENDED_RELEASE_TABLET | Freq: Once | ORAL | Status: DC

## 2023-09-25 MED ORDER — FAMOTIDINE 20 MG PO TABS: 20.0000 mg | ORAL_TABLET | Freq: Two times a day (BID) | ORAL | Status: DC

## 2023-09-25 MED ORDER — LEVETIRACETAM IN NACL 500 MG/100ML IV SOLN
500.0000 mg | Freq: Two times a day (BID) | INTRAVENOUS | Status: DC
  Administered 2023-09-25 – 2023-09-27 (×6): 500 mg via INTRAVENOUS
  Filled 2023-09-25 (×6): qty 100

## 2023-09-25 MED ORDER — POTASSIUM CHLORIDE 10 MEQ/100ML IV SOLN
10.0000 meq | INTRAVENOUS | Status: AC
  Administered 2023-09-25 (×3): 10 meq via INTRAVENOUS
  Filled 2023-09-25 (×3): qty 100

## 2023-09-25 MED ORDER — FENTANYL CITRATE PF 50 MCG/ML IJ SOSY
25.0000 ug | PREFILLED_SYRINGE | Freq: Once | INTRAMUSCULAR | Status: DC
  Filled 2023-09-25: qty 1

## 2023-09-25 MED ORDER — POTASSIUM CHLORIDE 20 MEQ PO PACK
40.0000 meq | PACK | Freq: Once | ORAL | Status: AC
  Administered 2023-09-25: 40 meq
  Filled 2023-09-25: qty 2

## 2023-09-25 MED ORDER — FENTANYL 2500MCG IN NS 250ML (10MCG/ML) PREMIX INFUSION
25.0000 ug/h | INTRAVENOUS | Status: DC
  Administered 2023-09-25: 50 ug/h via INTRAVENOUS
  Administered 2023-09-25: 25 ug/h via INTRAVENOUS
  Administered 2023-09-26: 50 ug/h via INTRAVENOUS
  Administered 2023-09-27: 175 ug/h via INTRAVENOUS
  Filled 2023-09-25 (×4): qty 250

## 2023-09-25 MED ORDER — POLYETHYLENE GLYCOL 3350 17 G PO PACK
17.0000 g | PACK | Freq: Every day | ORAL | Status: DC | PRN
  Administered 2023-09-25: 17 g

## 2023-09-25 MED ORDER — DEXTROSE IN LACTATED RINGERS 5 % IV SOLN: INTRAVENOUS | Status: AC

## 2023-09-25 MED ORDER — SODIUM CHLORIDE 0.9 % IV SOLN
2.0000 g | INTRAVENOUS | Status: AC
  Administered 2023-09-25 – 2023-09-29 (×5): 2 g via INTRAVENOUS
  Filled 2023-09-25 (×5): qty 20

## 2023-09-25 MED ORDER — HEPARIN SODIUM (PORCINE) 5000 UNIT/ML IJ SOLN
5000.0000 [IU] | Freq: Three times a day (TID) | INTRAMUSCULAR | Status: DC
  Administered 2023-09-25 – 2023-10-19 (×73): 5000 [IU] via SUBCUTANEOUS
  Filled 2023-09-25 (×72): qty 1

## 2023-09-25 MED ORDER — FENTANYL BOLUS VIA INFUSION
25.0000 ug | INTRAVENOUS | Status: DC | PRN
  Administered 2023-09-26: 50 ug via INTRAVENOUS

## 2023-09-25 MED ORDER — PROPOFOL 1000 MG/100ML IV EMUL
0.0000 ug/kg/min | INTRAVENOUS | Status: DC
  Administered 2023-09-25: 10 ug/kg/min via INTRAVENOUS
  Filled 2023-09-25: qty 100

## 2023-09-25 MED ORDER — NOREPINEPHRINE 4 MG/250ML-% IV SOLN
2.0000 ug/min | INTRAVENOUS | Status: DC
  Filled 2023-09-25: qty 250

## 2023-09-25 MED ORDER — ORAL CARE MOUTH RINSE: 15.0000 mL | OROMUCOSAL | Status: DC | PRN

## 2023-09-25 MED ORDER — LACTATED RINGERS IV BOLUS
1000.0000 mL | Freq: Once | INTRAVENOUS | Status: AC
Start: 2023-09-25 — End: 2023-09-25
  Administered 2023-09-25: 1000 mL via INTRAVENOUS

## 2023-09-25 MED ORDER — SODIUM CHLORIDE 0.9 % IV SOLN
250.0000 mL | INTRAVENOUS | Status: AC
  Administered 2023-09-25: 250 mL via INTRAVENOUS

## 2023-09-25 MED ORDER — POTASSIUM CHLORIDE 20 MEQ PO PACK
40.0000 meq | PACK | ORAL | Status: AC
  Administered 2023-09-25 (×2): 40 meq
  Filled 2023-09-25 (×2): qty 2

## 2023-09-25 MED ORDER — ORAL CARE MOUTH RINSE
15.0000 mL | OROMUCOSAL | Status: DC
  Administered 2023-09-25 – 2023-09-29 (×49): 15 mL via OROMUCOSAL

## 2023-09-25 MED ORDER — FAMOTIDINE 20 MG PO TABS
10.0000 mg | ORAL_TABLET | Freq: Every day | ORAL | Status: DC
  Administered 2023-09-25 – 2023-09-29 (×5): 10 mg
  Filled 2023-09-25 (×5): qty 1

## 2023-09-25 MED ORDER — ACETAMINOPHEN 325 MG PO TABS: 650.0000 mg | ORAL_TABLET | ORAL | Status: DC | PRN

## 2023-09-25 MED ORDER — DOCUSATE SODIUM 50 MG/5ML PO LIQD
100.0000 mg | Freq: Two times a day (BID) | ORAL | Status: DC
  Administered 2023-09-26: 100 mg
  Filled 2023-09-25 (×2): qty 10

## 2023-09-25 MED ORDER — DOCUSATE SODIUM 50 MG/5ML PO LIQD
100.0000 mg | Freq: Two times a day (BID) | ORAL | Status: DC | PRN
  Administered 2023-09-25: 100 mg

## 2023-09-25 MED ORDER — POLYETHYLENE GLYCOL 3350 17 G PO PACK
17.0000 g | PACK | Freq: Every day | ORAL | Status: DC
  Filled 2023-09-25 (×2): qty 1

## 2023-09-25 NOTE — Progress Notes (Signed)
Sputum collected and sent to the lab at this time.

## 2023-09-25 NOTE — Progress Notes (Addendum)
 Upper extremity venous duplex completed. Please see CV Procedures for preliminary results.  Initial findings reported to Joelyn Music, Charity fundraiser.  Estanislao Heimlich, RVT 09/25/23 2:21 PM

## 2023-09-25 NOTE — TOC Initial Note (Signed)
 Transition of Care [facility]) - Initial/Assessment Note    Patient Details  Name: Lori Gates MRN: [id number] Date of Birth: [birth year]  [facility]) CM/SW Contact:    Omie Bickers, RN Phone Number: 09/25/2023, 2:01 PM  Clinical Narrative:                  Patient in ICU, currently intubated.  Per chart review: Patient from home. Admitted after being found down by daughter. Brought in by EMS seizing.  Attempt to call daughter, no answer. Alexandra Ang Dietra [phone]   Expected Discharge Plan:  (TBD) Barriers to Discharge: Continued Medical Work up   Patient Goals and CMS Choice Patient states their goals for this hospitalization and ongoing recovery are:: currently intubated          Expected Discharge Plan and Services                                              Prior Living Arrangements/Services                       Activities of Daily Living   ADL Screening (condition at time of admission) Independently performs ADLs?: No Does the patient have a NEW difficulty with bathing/dressing/toileting/self-feeding that is expected to last >3 days?: Yes (Initiates electronic notice to provider for possible OT consult) Does the patient have a NEW difficulty with getting in/out of bed, walking, or climbing stairs that is expected to last >3 days?: Yes (Initiates electronic notice to provider for possible PT consult) Does the patient have a NEW difficulty with communication that is expected to last >3 days?: Yes (Initiates electronic notice to provider for possible SLP consult) Is the patient deaf or have difficulty hearing?: No Does the patient have difficulty seeing, even when wearing glasses/contacts?: No Does the patient have difficulty concentrating, remembering, or making decisions?: Yes  Permission Sought/Granted                  Emotional Assessment              Admission diagnosis:  Lactic acidosis  [E87.20] Seizure (HCC) [R56.9] Acute hypercapnic respiratory failure (HCC) [J96.02] Hyperosmolar hyperglycemic state (HHS) (HCC) [E11.00] Acute encephalopathy [G93.40] Patient Active Problem List   Diagnosis Date Noted   Acute encephalopathy 09/25/2023   Seizure (HCC) 09/24/2023   PCP:  Default, Provider, MD Pharmacy:   [id number] - [place] ([place]), [place] - [address] [place] ([place]) [place] [address] Phone: [phone] Fax: [phone]  [id number] - [place],  - [address] [place] [address] Phone: [phone] Fax: [phone]     Social Drivers of Health (SDOH) Social History: SDOH Screenings   Tobacco Use: Medium Risk (09/24/2023)   SDOH Interventions:     Readmission Risk Interventions     No data to display

## 2023-09-25 NOTE — Progress Notes (Signed)
 EEG complete - results pending

## 2023-09-25 NOTE — Progress Notes (Signed)
 Same-day Neurology progress note  Patient transferred from [facility] as a telestroke with more concern for seizure in the setting of severe hyperglycemia then stroke.  On propofol which was held for exam Pupils sluggish round reactive.  Gaze midline. Breathing over the ventilator To noxious stimulation has more localization on the left upper extremity but does attempt to localize to the right upper extremity as well. Mild withdrawal to noxious simulation in both lower extremities along with grimace noted.  Blood sugars remained CBG of greater than 600 with the most recent BMP in the 700s.  On insulin  drip.  Would recommend continued management of the hyperglycemia per primary team.  MRI official report with incidental punctate infarct-I do not think it needs further chasing as it is likely either an incidental stroke finding due to small vessel disease or could also be secondary to seizure emanating from the left hemisphere since she had right gaze deviation on presentation.  An MR angiogram was ordered but I am going to cancel that for now because she needs stabilization from her hyperglycemia perspective first.  Also of note, toxicology screen positive for cocaine.  Will get an EEG in the morning  Can do Keppra 500 BID for now. May not need for longer term  Neurology to follow  20 minutes of additional CC time  -- Lori Francis, MD Neurologist [facility]

## 2023-09-25 NOTE — Inpatient Diabetes Management (Addendum)
 Inpatient Diabetes Program Recommendations  AACE/ADA: New Consensus Statement on Inpatient Glycemic Control (2015)  Target Ranges:  Prepandial:   less than 140 mg/dL      Peak postprandial:   less than 180 mg/dL (1-2 hours)      Critically ill patients:  140 - 180 mg/dL   Lab Results  Component Value Date   GLUCAP 175 (H) 09/25/2023    Review of Glycemic Control  Latest Reference Range & Units 09/25/23 04:46 09/25/23 06:04 09/25/23 06:49 09/25/23 07:59  Glucose-Capillary 70 - 99 mg/dL 409 (H) 811 (H) 914 (H) 175 (H)    Latest Reference Range & Units 09/25/23 04:47  Sodium 135 - 145 mmol/L 137  Potassium 3.5 - 5.1 mmol/L 3.4 (L)  Chloride 98 - 111 mmol/L 103  CO2 22 - 32 mmol/L 18 (L)  Glucose 70 - 99 mg/dL 782 (H)  BUN 8 - 23 mg/dL 18  Creatinine 9.56 - 2.13 mg/dL 0.86 (H)  Calcium 8.9 - 10.3 mg/dL 8.3 (L)  Anion gap 5 - 15  16 (H)    Latest Reference Range & Units 09/25/23 04:47  Beta-Hydroxybutyric Acid 0.05 - 0.27 mmol/L 0.83 (H)   Diabetes history: DM 2 Outpatient Diabetes medications: 70/30 listed at 15 units bid and also Humalog 13 units tid and Lantus 20 units, Rybelsus 6 mg Daily  Current orders for Inpatient glycemic control:  IV insulin /Endotool/DKA  Note renal Function elevated  Inpatient Diabetes Program Recommendations:    At time of transition consider -   Semglee 20 units -   Novolog  0-9 units Q4 hours  Thanks,  Eloise Hake RN, MSN, BC-ADM Inpatient Diabetes Coordinator Team Pager [phone] (8a-5p)

## 2023-09-25 NOTE — TOC CAGE-AID Note (Signed)
 Transition of Care [facility]) - CAGE-AID Screening   Patient Details  Name: Lori Gates MRN: [id number] Date of Birth: 1956/05/07  Transition of Care [facility]) CM/SW Contact:    Jacorian Golaszewski E Jyaire Koudelka, LCSW Phone Number: 09/25/2023, 1:46 PM   Clinical Narrative: Per chart review - patient is currently intubated.   CAGE-AID Screening: Substance Abuse Screening unable to be completed due to: : Patient unable to participate (patient currently intubated)

## 2023-09-25 NOTE — Progress Notes (Addendum)
 NAME:  Lori Gates, MRN:  [id number], DOB:  April 25, 1956, LOS: 1 ADMISSION DATE:  09/24/2023, CONSULTATION DATE:  4/29 REFERRING MD:  Dr. Lincoln Renshaw, CHIEF COMPLAINT:  seizures   History of Present Illness:  68 year old female w/ poorly controlled  IDDM, CKD stage IV, HTN, LLD, HFpEF, substance abuse (cocaine) followed by nephrology in [place].  Hx from ED: Daughter found on floor around 1500 actively seizing, CBG read "high". Continued to seize from time of EMS dispatch to arrival to ER. Pt had received 2.5 mg versed in route. On arrival had right facial twitching, right gaze pref. Received 2 mg IV ativan. After this gaze pref returned to midline w/ snoring respirations, no further twitching, would grimace to noxious stim.  CT head negative for acute bleed, there was left periorbital facial swelling.  MRI neg for acute process per neuro.  Intubated by EDP for airway protection  Given IV keppra (total 2 gms)  Initial lab wk: Na 129, glucose 989, anion gap 16, wbc 13, CK 121, trop 114, VBG Ph 7.08, pco2 81, bicarb 24, lactate 2.8-->2.9 Post intubation abg: 7.42/29/130/19.2 PCXR ett good position. 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 Down time not clear CBG "high" seized from EMS notification to arrival to ED> CT and MRI brain neg for acute bleed. Loaded 2 gm keppra. Intubated airway protection   Interim History /  Subjective:  Sedated on fentanyl 75 mcg, on DKA protocol  Objective   Blood pressure (!) 98/55, pulse 83, temperature (!) 100.6 F (38.1 C), resp. rate (!) 24, height 5\' 5"  (1.651 m), weight 61.6 kg, SpO2 99%.    Vent Mode: PRVC FiO2 (%):  [40 %-60 %] 40 % Set Rate:  [24 bmp] 24 bmp Vt Set:  [450 mL] 450 mL PEEP:  [5 cmH20] 5 cmH20 Plateau Pressure:  [19 cmH20-22 cmH20] 22 cmH20   Intake/Output Summary (Last 24 hours) at 09/25/2023 0846 Last data filed at 09/25/2023 0700 Gross per 24 hour  Intake 3232.01 ml  Output 500 ml  Net 2732.01 ml   Filed Weights   09/24/23 1526 09/25/23 0707  Weight: 60 kg 61.6 kg    Examination: Gen:      Intubated, sedated, acutely ill appearing HEENT:  ETT to vent Lungs:    sounds of mechanical ventilation auscultated no wheeze CV:         RRR Abd:      + bowel sounds; soft, non-tender; no palpable masses, no distension Ext:   LUE AVF +thrill and bruit Skin:      Warm and dry; no rashes Neuro:   sedated, RASS -3   Labs and imaging personally reviewed   CBGs>200 Na 137 K 3.4 CO2 18, AG 16 Cr 2.66 lactic acid 6.6 WBC 8.6 Hgb 12.7 BHB 0.83 Ammonia 28  Resolved Hospital Problem list     Assessment & Plan:  Acute metabolic encephalopathy secondary to: Seizure with post-ictal state  UDS positive Cocaine and Benzos DKA Plan: -Neuro following; appreciate recs. Plan for EEG.  -Treat metabolic derangements -EEG pending -limit sedating meds. Currently fentanyl gtt only -Continue neuroprotective measures- normothermia, euglycemia, HOB greater than 30, head in neutral alignment, normocapnia, normoxia  Acute hypercarbic resp failure s/p seizure.  Does have some right basilar vol loss. Chronically elevated Right HD Plan: -CXR on arrival -ABG -Full vent support -VAP bundle -Resp culture  -PAD protocol   DKA with coma Hx of IDDM Plan: -Insulin  per endotool -CBG monitoring -Aggressive IV fluid resuscitation -trend  B-Hydroxy -osmolality pending -serial bmp -check A1c  AKI on CKD 4 Hyponatremia Lactic acidosis Plan: - LUE AVF placed in 2024. Still making urine. - consult to nephrology. No emergent RRT needs. -iv fluids -Trend BMP / urinary output -Replace electrolytes as indicated -Avoid nephrotoxic agents, ensure adequate renal perfusion  UTI Sepsis Plan: -rocephin -follow cultures -iv fluids -trend LA  Elevated troponin Plan: -likely demand -trend  LUE swelling Plan: -US  LUE  HTN HLD HFpEF Plan: -hold home anti-htn meds; avoid BB w/ hx of cocain use -statin -hold diuretics given appears dehyrated    Best Practice (right click and "Reselect all SmartList Selections" daily)   Diet/type: NPO w/ meds via tube DVT prophylaxis prophylactic heparin  Pressure ulcer(s): N/A GI prophylaxis: H2B Lines: N/A Foley:  Yes, and it is still needed Code Status:  full code Last date of multidisciplinary goals of care discussion [4/30 updated grandson over phone. States full code]    Critical care time:     The patient is critically ill due to encephalopathy.  Critical care was necessary to treat or prevent imminent or life-threatening deterioration.  Critical care was time spent personally by me on the following activities: development of treatment plan with patient and/or surrogate as well as nursing, discussions with consultants, evaluation of patient's response to treatment, examination of patient, obtaining history from patient or surrogate, ordering and performing treatments and interventions, ordering and review of laboratory studies, ordering and review of radiographic studies, pulse oximetry, re-evaluation of patient's condition and participation in multidisciplinary rounds.   Critical Care Time devoted to patient care services described in this note is 35 minutes. This time reflects time of care of this signee Naasia Weilbacher S Kaytlin Burklow . This critical care time does not reflect  separately billable procedures or procedure time, teaching time or supervisory time of PA/NP/Med student/Med Resident etc but could involve care discussion time.       Quentin Brunner Pulmonary and Critical Care Medicine 09/25/2023 8:50 AM  Pager: see AMION  If no response to pager , please call critical care on call (see AMION) until 7pm After 7:00 pm call Elink

## 2023-09-25 NOTE — Consult Note (Signed)
 Renal Service Consult Note [facility]  MEREDETH MELLEMA 09/25/2023 Lynae Sandifer, MD Requesting Physician: Dr. Dione Franks  Reason for Consult: CKD patient w/ seizures HPI: The patient is a 68 y.o. year-old w/ PMH as below who was found down on the floor by her daughter seizing. CGB read "high". EMS gave versed and ED at [facility] gave Ativan 2mg  IV. CT head neg and MRI head was negative. Pt was intubated for airway protection. Drug screen was + for cocaine and BZD's. Pt was rx'd w/ IV insulin  protocol for HHS vs DKA. Pt was admitted to ICU at [facility]. We are asked to see for renal failure.    Pt seen in ICU. Pt is on vent and sedated. Initial BS's were > 600, currently is in the 100- 150 range. Na 138, K 3.1 , BUN 18, creat 2.73 , eGFR 18 ml/min. Pt had fevers to 101 last night, WBC 8K -> 6K today. Getting IV rocephin. SBP's have been soft overnight in the 90s- 100s. Pressors have not been needed. UOP is poor w/ 90 cc yesterday and 60 cc today. CXR showing progressive RLL infiltrate. No edema.   I called the daughters phone number without answer.  I talked to the grandson, [name] Broadnax.  He said that his grandmother is not on dialysis yet and that they recently put a fistula in her left arm about 1 month ago.  She was living in [place]  but recently moved to [place], [place].   ROS -N/A PMH: IDDM CKD HTN   Past Surgical History  Past Surgical History:  Procedure Laterality Date   TUBAL LIGATION     Family History History reviewed. No pertinent family history. Social History  reports that she has quit smoking. Her smoking use included cigarettes. She does not have any smokeless tobacco history on file. She reports that she does not drink alcohol. No history on file for drug use. Allergies No Known Allergies Home medications Prior to Admission medications   Medication Sig Start Date End Date Taking? Authorizing Provider  albuterol -ipratropium (COMBIVENT) 18-103  MCG/ACT inhaler Inhale 2 puffs into the lungs every 6 (six) hours as needed. For shortness of breath    [provider]  amLODipine (NORVASC) 5 MG tablet Take 5 mg by mouth daily.    [provider]  atorvastatin (LIPITOR) 80 MG tablet Take 80 mg by mouth daily.    [provider]  ibuprofen (ADVIL,MOTRIN) 200 MG tablet Take 400 mg by mouth every 6 (six) hours as needed. For pain    [provider]  insulin  aspart protamine- aspart (NOVOLOG  MIX 70/30) (70-30) 100 UNIT/ML injection Inject 0.15 mLs (15 Units total) into the skin daily with breakfast. 05/27/21   Darletta Ehrich, PA-C  insulin  aspart protamine- aspart (NOVOLOG  MIX 70/30) (70-30) 100 UNIT/ML injection Inject 0.15 mLs (15 Units total) into the skin daily with breakfast. 05/28/21   Wynetta Heckle, MD  insulin  lispro (HUMALOG) 100 UNIT/ML KwikPen Inject 13 Units into the skin 3 (three) times daily. 07/14/23   [provider]  LANTUS SOLOSTAR 100 UNIT/ML Solostar Pen Inject 20 Units into the skin at bedtime.    [provider]  nebivolol (BYSTOLIC) 2.5 MG tablet Take 2.5 mg by mouth daily. 07/14/23   [provider]  ondansetron  (ZOFRAN ) 4 MG tablet Take 1 tablet (4 mg total) by mouth every 8 (eight) hours as needed for nausea. 05/06/12   Pisciotta, Peterson Brandt, PA-C  oseltamivir  (TAMIFLU ) 75 MG capsule Take  1 capsule (75 mg total) by mouth every 12 (twelve) hours. 05/06/12   Pisciotta, Peterson Brandt, PA-C  RYBELSUS 3 MG TABS Take 2 tablets by mouth daily.    [provider]  thiamine (VITAMIN B-1) 100 MG tablet Take 100 mg by mouth daily.    [provider]  torsemide (DEMADEX) 20 MG tablet Take 20 mg by mouth daily.    [provider]     Vitals:   09/25/23 1400 09/25/23 1430 09/25/23 1500 09/25/23 1532  BP: 95/62 97/65 103/66 99/65  Pulse: 82 82 82 81  Resp: (!) 24 (!) 24 (!) 24 (!) 24  Temp: (!) 100.6 F (38.1 C) (!) 100.4 F (38 C) (!) 100.4 F (38 C)  (!) 100.4 F (38 C)  TempSrc:      SpO2: 100% 100% 100% 100%  Weight:      Height:       Exam Gen on vent, sedated No rash, cyanosis or gangrene Sclera anicteric, throat w/ ETT No jvd or bruits Chest clear anterior/ lateral RRR no MRG Abd soft ntnd no mass or ascites +bs GU normal MS no joint effusions or deformity Ext diffuse 1-2+ bilat LE and UE edema Neuro is on vent, sedated  LUA AVF w/ + bruit     Renal-related home meds: Norvasc 5 mg daily Advil as needed Bystolic 2.5 mg daily Torsemide 20 mg daily Others: Lipitor, Rybelsus, insulin  Lantus/lispro, Combivent  Date   Creat  eGFR (ml/min) 2013   0.99 Dec 2022  1.09  56 ml/min Feb 2024  2.0  22 November 2022  2.8  17 Sept 2024  2.49  21 ml/min 09/24/23  2.64  19  09/25/23  2.73  18   Assessment/ Plan: CKD 4 - b/l creatinine is 2.5- 2.8 from mid-late 2024, eGFR 17- 21 ml/min. Creat here is 2.6- 2.7 in baseline range. AVF was placed in L upper ext per the family, but the patient is not on dialysis. Not sure who follows her at this time, she recently moved from [place] to [place].  Volume: sig bilat LE edema, some UE edema as well. CXR is clear. BP's are soft so will hold off on diuresing for now.  Seizures: per neurology, treat metabolic derangements Drug abuse: cocaine and BZD's were+ on drug screen DKA w/ coma: per CCM UTI/ sepsis: w/ borderline hypotension, on IV rocephin Acute hypoxic resp failure - on vent LUE AVF: suspected AVF, good bruit, per family was placed/ created about 1 month ago when she was in [place]  MD [facility] 09/25/2023, 4:05 PM  Recent Labs  Lab 09/24/23 1534 09/24/23 2331 09/25/23 0447 09/25/23 0955  HGB 12.0   < > 12.7 11.3*  ALBUMIN 3.6  --   --   --   CALCIUM 8.6*   < > 8.3* 8.4*  CREATININE 2.64*   < > 2.66* 2.73*  K 3.5   < > 3.4* 3.1*   < > = values in this interval not displayed.   Inpatient medications:  atorvastatin  80 mg Per Tube Daily   [START ON  09/26/2023] Chlorhexidine Gluconate Cloth  6 each Topical Daily   docusate  100 mg Per Tube BID   famotidine   10 mg Per Tube Daily   fentaNYL (SUBLIMAZE) injection  25 mcg Intravenous Once   heparin  5,000 Units Subcutaneous Q8H   mouth rinse  15 mL Mouth Rinse Q2H   polyethylene glycol  17 g  Per Tube Daily   potassium chloride   40 mEq Per Tube Q4H   thiamine  100 mg Per Tube Daily    sodium chloride  Stopped (09/25/23 1139)   cefTRIAXone (ROCEPHIN)  IV     dextrose 5% lactated ringers 125 mL/hr at 09/25/23 1506   dextrose 5% lactated ringers     fentaNYL infusion INTRAVENOUS Stopped (09/25/23 1201)   insulin  1.3 Units/hr (09/25/23 1400)   lactated ringers Stopped (09/25/23 1000)   levETIRAcetam Stopped (09/25/23 0948)   norepinephrine (LEVOPHED) Adult infusion     acetaminophen, dextrose, docusate, fentaNYL, ipratropium-albuterol , mouth rinse, polyethylene glycol

## 2023-09-25 NOTE — Plan of Care (Incomplete)

## 2023-09-25 NOTE — Progress Notes (Signed)
 NEUROLOGY CONSULT FOLLOW UP NOTE   Date of service: September 25, 2023 Patient Name: Lori Gates MRN:  [id number] DOB:  November 23, 1955  Interval Hx/subjective  CBG now 175.   Vitals   Vitals:   09/25/23 0707 09/25/23 0737 09/25/23 0739 09/25/23 0745  BP:  (!) 99/52 (!) 112/58 (!) 98/55  Pulse:  83 82 83  Resp:  (!) 24 17 (!) 24  Temp:  (!) 100.4 F (38 C) (!) 100.4 F (38 C) (!) 100.6 F (38.1 C)  TempSrc:  Bladder    SpO2:  99% 98% 99%  Weight: 61.6 kg     Height:         Body mass index is 22.6 kg/m.  Physical Exam   Constitutional: Appears well-developed and well-nourished.  Eyes: No scleral injection.  HENT: Intubated Head: Normocephalic.   Neurologic Examination  Ment: On fentanyl gtt at a rate of 75. Not responding to pain or to voice. No posturing.  CN: Pupils 2 mm and sluggish. No blink to threat. Minimal response to oculocephalic maneuver. Trace corneal reflexes. Face flaccidly symmetric.  Motor/Sensory: Increased flexor and extensor tone to BUE at elbows as well as increased flexor tone to digits of hands bilaterally. Tone is decreased in BLE. No withdrawal to noxious x 4.  Reflexes: 2+ bilateral patellars, 1+ bilateral brachioradialis Cerebellar/Gait: Unable to assess   Medications  Current Facility-Administered Medications:    0.9 %  sodium chloride  infusion, 250 mL, Intravenous, Continuous, Marlys Singh, John D, PA-C, Last Rate: 20 mL/hr at 09/25/23 0700, Infusion Verify at 09/25/23 0700   acetaminophen (TYLENOL) tablet 650 mg, 650 mg, Per Tube, Q4H PRN, Payne, John D, PA-C   atorvastatin (LIPITOR) tablet 80 mg, 80 mg, Per Tube, Daily, Payne, John D, PA-C   cefTRIAXone (ROCEPHIN) 2 g in sodium chloride  0.9 % 100 mL IVPB, 2 g, Intravenous, Q24H, Payne, John D, PA-C   [START ON 09/26/2023] Chlorhexidine Gluconate Cloth 2 % PADS 6 each, 6 each, Topical, Daily, Desai, Nikita S, MD   dextrose 5 % in lactated ringers infusion, , Intravenous, Continuous, Mordecai Applebaum, MD, Last Rate: 125 mL/hr at 09/25/23 0813, New Bag at 09/25/23 0813   dextrose 50 % solution 0-50 mL, 0-50 mL, Intravenous, PRN, Mordecai Applebaum, MD   docusate (COLACE) 50 MG/5ML liquid 100 mg, 100 mg, Per Tube, BID, Marlys Singh, John D, PA-C   docusate (COLACE) 50 MG/5ML liquid 100 mg, 100 mg, Per Tube, BID PRN, Casimiro Cleaves, PA-C   famotidine  (PEPCID ) tablet 20 mg, 20 mg, Per Tube, BID, Payne, John D, PA-C   fentaNYL (SUBLIMAZE) bolus via infusion 25-100 mcg, 25-100 mcg, Intravenous, Q15 min PRN, Payne, John D, PA-C   fentaNYL (SUBLIMAZE) injection 25 mcg, 25 mcg, Intravenous, Once, Marlys Singh, John D, PA-C   fentaNYL in NS (7mcg/ml) infusion-PREMIX, 25-200 mcg/hr, Intravenous, Continuous, Casimiro Cleaves, PA-C, Last Rate: 12.5 mL/hr at 09/25/23 0700, 125 mcg/hr at 09/25/23 0700   heparin injection 5,000 Units, 5,000 Units, Subcutaneous, Q8H, Payne, John D, PA-C, 5,000 Units at 09/25/23 4696   insulin  regular, human (MYXREDLIN) 100 units/ 100 mL infusion, , Intravenous, Continuous, Mordecai Applebaum, MD, Last Rate: 4.6 mL/hr at 09/25/23 0700, 4.6 Units/hr at 09/25/23 0700   ipratropium-albuterol  (DUONEB) 0.5-2.5 (3) MG/3ML nebulizer solution 3 mL, 3 mL, Nebulization, Q4H PRN, Payne, John D, PA-C   lactated ringers infusion, , Intravenous, Continuous, Mordecai Applebaum, MD, Last Rate: 125 mL/hr at 09/25/23 0700, Infusion Verify at 09/25/23 0700  norepinephrine (LEVOPHED) 4mg  in (0.016 mg/mL) premix infusion, 2-10 mcg/min, Intravenous, Titrated, Dannie Duval D, PA-C   Oral care mouth rinse, 15 mL, Mouth Rinse, Q2H, Desai, Nikita S, MD   Oral care mouth rinse, 15 mL, Mouth Rinse, PRN, Desai, Nikita S, MD   polyethylene glycol (MIRALAX / GLYCOLAX) packet 17 g, 17 g, Per Tube, Daily, Marlys Singh, John D, PA-C   polyethylene glycol (MIRALAX / GLYCOLAX) packet 17 g, 17 g, Per Tube, Daily PRN, Dannie Duval D, PA-C   propofol (DIPRIVAN) 1000 MG/100ML infusion, 0-50 mcg/kg/min,  Intravenous, Continuous, Casimiro Cleaves, PA-C, Stopped at 09/25/23 0602   thiamine (VITAMIN B1) tablet 100 mg, 100 mg, Per Tube, Daily, Maryella Smothers  Labs and Diagnostic Imaging   CBC:  Recent Labs  Lab 09/24/23 1534 09/25/23 0218 09/25/23 0447  WBC 13.0*  --  8.6  NEUTROABS 11.4*  --  6.3  HGB 12.0 8.8* 12.7  HCT 38.7 26.0* 38.2  MCV 100.0  --  94.3  PLT 277  --  235    Basic Metabolic Panel:  Lab Results  Component Value Date   NA 137 09/25/2023   K 3.4 (L) 09/25/2023   CO2 18 (L) 09/25/2023   GLUCOSE 431 (H) 09/25/2023   BUN 18 09/25/2023   CREATININE 2.66 (H) 09/25/2023   CALCIUM 8.3 (L) 09/25/2023   GFRNONAA 19 (L) 09/25/2023   GFRAA 72 (L) 05/06/2012   Lipid Panel: No results found for: "LDLCALC" HgbA1c: No results found for: "HGBA1C" Urine Drug Screen:     Component Value Date/Time   LABOPIA NONE DETECTED 09/24/2023 1735   COCAINSCRNUR POSITIVE (A) 09/24/2023 1735   LABBENZ POSITIVE (A) 09/24/2023 1735   AMPHETMU NONE DETECTED 09/24/2023 1735   THCU NONE DETECTED 09/24/2023 1735   LABBARB NONE DETECTED 09/24/2023 1735    Alcohol Level     Component Value Date/Time   [facility] <15 09/24/2023 1535   INR  Lab Results  Component Value Date   INR 1.0 09/24/2023     Assessment  68 year old female transferred from [facility] as a telestroke but with more concern for seizure in the setting of severe hyperglycemia then stroke. PMHx is significant for CKD stage IV, pending fistula placement for plan to start dialysis, uncontrolled diabetes, hypertension, hyperlipidemia, smoking, cocaine abuse and congestive heart failure. Per EMS report last seen normal at 9:30 in the morning.  Then found on the floor seizing at 3 PM with glucose reading "high".  Continues on insulin  drip and blood glucose most recently this AM was at 175 per RN. Off propofol and now on fentanyl gtt at a rate of 75.  - Exam this AM reveals increased BUE extensor and flexor tone,  round 2 mm pupils that are sluggishly reactive, trace corneals. No responses to voice or noxious in the context of fentanyl gtt.  - MRI official report with incidental punctate infarct-I do not think it needs further chasing as it is likely either an incidental stroke finding due to small vessel disease or could also be secondary to seizure emanating from the left hemisphere since she had right gaze deviation on presentation. - Toxicology screen was positive for cocaine - CrCl is 18.2, therefore it may take longer to clear propofol from her system  (stopped about 6 AM today) - Impression: New onset seizure secondary to severe hyperglycemia and cocaine use  Recommendations  - EEG has been ordered and is pending.  - Keppra 500 BID for now.  May not need for longer term given that her seizure was most likely provoked by her severe hyperglycemia with cocaine use also lowering the seizure threshold.  - Inpatient seizure precautions.   ______________________________________________________________________   Hope Ly, Ayannah Faddis, MD [facility]

## 2023-09-25 NOTE — Procedures (Signed)
 Patient Name: Lori Gates  MRN: [id number]  Epilepsy Attending: Arleene Lack  Referring Physician/Provider: Kimberley Penman, MD  Date: 09/25/2023 Duration: 23.47 mins  Patient history: 68 year old female transferred from [facility] as a telestroke but with more concern for seizure in the setting of severe hyperglycemia then stroke.   Level of alertness:  comatose  AEDs during EEG study: LEV, propofol  Technical aspects: This EEG study was done with scalp electrodes positioned according to the 10-20 International system of electrode placement. Electrical activity was reviewed with band pass filter of 1-70Hz , sensitivity of 7 uV/mm, display speed of 32mm/sec with a 60Hz  notched filter applied as appropriate. EEG data were recorded continuously and digitally stored.  Video monitoring was available and reviewed as appropriate.  Description: EEG showed continuous generalized 3 to 5 Hz theta-delta slowing. Hyperventilation and photic stimulation were not performed.     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 No seizures or epileptiform discharges were seen throughout the recording.  Lori Gates

## 2023-09-26 ENCOUNTER — Inpatient Hospital Stay (HOSPITAL_COMMUNITY)

## 2023-09-26 DIAGNOSIS — E1165 Type 2 diabetes mellitus with hyperglycemia: Secondary | ICD-10-CM | POA: Diagnosis not present

## 2023-09-26 DIAGNOSIS — R569 Unspecified convulsions: Secondary | ICD-10-CM

## 2023-09-26 DIAGNOSIS — J9602 Acute respiratory failure with hypercapnia: Secondary | ICD-10-CM

## 2023-09-26 DIAGNOSIS — F1490 Cocaine use, unspecified, uncomplicated: Secondary | ICD-10-CM | POA: Diagnosis not present

## 2023-09-26 LAB — BASIC METABOLIC PANEL WITH GFR
Anion gap: 13 (ref 5–15)
Anion gap: 11 (ref 5–15)
BUN: 20 mg/dL (ref 8–23)
BUN: 21 mg/dL (ref 8–23)
CO2: 22 mmol/L (ref 22–32)
CO2: 21 mmol/L — ABNORMAL LOW (ref 22–32)
Calcium: 8.4 mg/dL — ABNORMAL LOW (ref 8.9–10.3)
Calcium: 8.2 mg/dL — ABNORMAL LOW (ref 8.9–10.3)
Chloride: 105 mmol/L (ref 98–111)
Chloride: 106 mmol/L (ref 98–111)
Creatinine, Ser: 2.81 mg/dL — ABNORMAL HIGH (ref 0.44–1.00)
Creatinine, Ser: 2.9 mg/dL — ABNORMAL HIGH (ref 0.44–1.00)
GFR, Estimated: 18 mL/min — ABNORMAL LOW (ref >60)
GFR, Estimated: 17 mL/min — ABNORMAL LOW (ref >60)
Glucose, Bld: 138 mg/dL — ABNORMAL HIGH (ref 70–99)
Glucose, Bld: 132 mg/dL — ABNORMAL HIGH (ref 70–99)
Potassium: 5 mmol/L (ref 3.5–5.1)
Potassium: 5.7 mmol/L — ABNORMAL HIGH (ref 3.5–5.1)
Sodium: 140 mmol/L (ref 135–145)
Sodium: 138 mmol/L (ref 135–145)

## 2023-09-26 LAB — GLUCOSE, CAPILLARY
Glucose-Capillary: 114 mg/dL — ABNORMAL HIGH (ref 70–99)
Glucose-Capillary: 125 mg/dL — ABNORMAL HIGH (ref 70–99)
Glucose-Capillary: 128 mg/dL — ABNORMAL HIGH (ref 70–99)
Glucose-Capillary: 132 mg/dL — ABNORMAL HIGH (ref 70–99)
Glucose-Capillary: 136 mg/dL — ABNORMAL HIGH (ref 70–99)
Glucose-Capillary: 139 mg/dL — ABNORMAL HIGH (ref 70–99)
Glucose-Capillary: 145 mg/dL — ABNORMAL HIGH (ref 70–99)
Glucose-Capillary: 152 mg/dL — ABNORMAL HIGH (ref 70–99)
Glucose-Capillary: 154 mg/dL — ABNORMAL HIGH (ref 70–99)
Glucose-Capillary: 155 mg/dL — ABNORMAL HIGH (ref 70–99)
Glucose-Capillary: 165 mg/dL — ABNORMAL HIGH (ref 70–99)
Glucose-Capillary: 167 mg/dL — ABNORMAL HIGH (ref 70–99)
Glucose-Capillary: 168 mg/dL — ABNORMAL HIGH (ref 70–99)
Glucose-Capillary: 174 mg/dL — ABNORMAL HIGH (ref 70–99)
Glucose-Capillary: 176 mg/dL — ABNORMAL HIGH (ref 70–99)
Glucose-Capillary: 181 mg/dL — ABNORMAL HIGH (ref 70–99)

## 2023-09-26 LAB — HEPATITIS B SURFACE ANTIGEN: Hepatitis B Surface Ag: NONREACTIVE

## 2023-09-26 LAB — TRIGLYCERIDES: Triglycerides: 66 mg/dL (ref <150)

## 2023-09-26 MED ORDER — INSULIN ASPART 100 UNIT/ML IJ SOLN
0.0000 [IU] | INTRAMUSCULAR | Status: DC
  Administered 2023-09-26 – 2023-09-27 (×3): 1 [IU] via SUBCUTANEOUS
  Administered 2023-09-28: 4 [IU] via SUBCUTANEOUS
  Administered 2023-09-28: 1 [IU] via SUBCUTANEOUS
  Administered 2023-09-29 (×2): 3 [IU] via SUBCUTANEOUS
  Administered 2023-09-30: 1 [IU] via SUBCUTANEOUS
  Administered 2023-09-30 – 2023-10-01 (×3): 2 [IU] via SUBCUTANEOUS

## 2023-09-26 MED ORDER — MIDAZOLAM HCL 2 MG/2ML IJ SOLN
2.0000 mg | Freq: Once | INTRAMUSCULAR | Status: AC
  Administered 2023-09-26: 2 mg via INTRAVENOUS

## 2023-09-26 MED ORDER — PENTAFLUOROPROP-TETRAFLUOROETH EX AERO: 1.0000 | INHALATION_SPRAY | CUTANEOUS | Status: DC | PRN

## 2023-09-26 MED ORDER — CHLORHEXIDINE GLUCONATE CLOTH 2 % EX PADS
6.0000 | MEDICATED_PAD | Freq: Every day | CUTANEOUS | Status: DC
Start: 2023-09-26 — End: 2023-09-30
  Administered 2023-09-26 – 2023-09-29 (×3): 6 via TOPICAL

## 2023-09-26 MED ORDER — LIDOCAINE HCL (PF) 1 % IJ SOLN: 5.0000 mL | INTRAMUSCULAR | Status: DC | PRN

## 2023-09-26 MED ORDER — OSMOLITE 1.5 CAL PO LIQD
1000.0000 mL | ORAL | Status: DC
  Administered 2023-09-26 – 2023-10-02 (×5): 1000 mL
  Filled 2023-09-26: qty 1000

## 2023-09-26 MED ORDER — SODIUM ZIRCONIUM CYCLOSILICATE 10 G PO PACK
10.0000 g | PACK | Freq: Two times a day (BID) | ORAL | Status: AC
  Administered 2023-09-26 (×2): 10 g
  Filled 2023-09-26 (×2): qty 1

## 2023-09-26 MED ORDER — ALTEPLASE 2 MG IJ SOLR: 2.0000 mg | Freq: Once | INTRAMUSCULAR | Status: DC | PRN

## 2023-09-26 MED ORDER — LIDOCAINE HCL (PF) 1 % IJ SOLN
INTRAMUSCULAR | Status: AC
Start: 2023-09-26 — End: 2023-09-26
  Administered 2023-09-26: 5 mL
  Filled 2023-09-26: qty 5

## 2023-09-26 MED ORDER — PROSOURCE TF20 ENFIT COMPATIBL EN LIQD
60.0000 mL | Freq: Two times a day (BID) | ENTERAL | Status: DC
Start: 2023-09-26 — End: 2023-09-29
  Administered 2023-09-26 – 2023-09-29 (×7): 60 mL
  Filled 2023-09-26 (×6): qty 60

## 2023-09-26 MED ORDER — FENTANYL CITRATE PF 50 MCG/ML IJ SOSY
50.0000 ug | PREFILLED_SYRINGE | INTRAMUSCULAR | Status: DC | PRN
  Administered 2023-09-26: 50 ug via INTRAVENOUS

## 2023-09-26 MED ORDER — HEPARIN SODIUM (PORCINE) 1000 UNIT/ML DIALYSIS: 1000.0000 [IU] | INTRAMUSCULAR | Status: DC | PRN

## 2023-09-26 MED ORDER — MIDAZOLAM HCL 2 MG/2ML IJ SOLN
INTRAMUSCULAR | Status: AC
Start: 2023-09-26 — End: 2023-09-26
  Filled 2023-09-26: qty 2

## 2023-09-26 MED ORDER — INSULIN GLARGINE-YFGN 100 UNIT/ML ~~LOC~~ SOLN
10.0000 [IU] | Freq: Two times a day (BID) | SUBCUTANEOUS | Status: DC
  Administered 2023-09-26 – 2023-09-27 (×3): 10 [IU] via SUBCUTANEOUS
  Filled 2023-09-26 (×5): qty 0.1

## 2023-09-26 MED ORDER — LIDOCAINE-PRILOCAINE 2.5-2.5 % EX CREA: 1.0000 | TOPICAL_CREAM | CUTANEOUS | Status: DC | PRN

## 2023-09-26 MED ORDER — ANTICOAGULANT SODIUM CITRATE 4% (200MG/5ML) IV SOLN: 5.0000 mL | Status: DC | PRN

## 2023-09-26 NOTE — Procedures (Signed)
 Central Venous Catheter Insertion Procedure Note  Lori Gates  [id number]  [birth year]  Date:09/26/23  Time:9:36 AM   Provider Performing:Jaspreet Bodner   Procedure: Insertion of Non-tunneled Central Venous Catheter(36556)with US  guidance (44034)    Indication(s) Hemodialysis  Consent Risks of the procedure as well as the alternatives and risks of each were explained to the patient and/or caregiver.  Consent for the procedure was obtained and is signed in the bedside chart Grandson updated by phone see permit sheet Anesthesia Topical only with 1% lidocaine    Timeout Verified patient identification, verified procedure, site/side was marked, verified correct patient position, special equipment/implants available, medications/allergies/relevant history reviewed, required imaging and test results available.  Sterile Technique Maximal sterile technique including full sterile barrier drape, hand hygiene, sterile gown, sterile gloves, mask, hair covering, sterile ultrasound probe cover (if used).  Procedure Description Area of catheter insertion was cleaned with chlorhexidine  and draped in sterile fashion.   With real-time ultrasound guidance a HD catheter was placed into the right internal jugular vein.  Nonpulsatile blood flow and easy flushing noted in all ports.  The catheter was sutured in place and sterile dressing applied.  Complications/Tolerance None; patient tolerated the procedure well. Chest X-ray is ordered to verify placement for internal jugular or subclavian cannulation.  Chest x-ray is not ordered for femoral cannulation.  EBL Minimal  Specimen(s) None    Lori Gates Lori Gates ACNP Acute Care Nurse Practitioner Lori Gates Pulmonary/Critical Care Please consult Amion 09/26/2023, 9:38 AM

## 2023-09-26 NOTE — Progress Notes (Signed)
 Lori Gates is an 68 y.o. female  w/ IDDM, HTN, CKD4 found down on the floor by her daughter seizing. CGB read "high". EMS gave versed  and ED at [facility] gave Ativan  2mg  IV. CT head neg and MRI head was negative. Pt was intubated for airway protection. Drug screen was + for cocaine and BZD's. Pt was rx'd w/ IV insulin  protocol for HHS vs DKA. Pt was admitted to ICU at [facility] Initial BS's were > 600, BUN 18, creat 2.73 , eGFR 18 ml/min. Pt had fevers to 101. Pressors have not been needed. UOP is poor w/ 90 cc yesterday and 60 cc today. CXR showing progressive RLL infiltrate. No edema.  Not on dialysis yet but recently put a fistula in her left arm about 1 month ago.  She was living in [place]  but recently moved to [place], [place].  Assessment/Plan:  CKD 4 - b/l creatinine is 2.5- 2.8 from mid-late 2024, eGFR 17- 21 ml/min. Creat here is 2.6- 2.7 in baseline range. AVF was placed in L upper ext per the family, but the patient is not on dialysis. Not sure who follows her at this time, she recently moved from [place] to [place].  -She is already +5.9 L with minimal urine output with only 75 mL over the past 24 hours. - Stable also at the 5.7; will give Lokelma  and initiate dialysis.  I was able to get a hold of the daughter Melissa Broadnax and notified her.  She is on her way to see her. - Appreciate temp catheter by CCM.  -Monitor Daily I/Os, Daily weight  -Maintain MAP>65 for optimal renal perfusion.  - Avoid nephrotoxic agents such as IV contrast, NSAIDs, and phosphate containing bowel preps (FLEETS)  Volume: sig bilat LE edema, some UE edema as well. CXR is clear. BP's are soft so will hold off on diuresing for now.  Seizures: per neurology, treat metabolic derangements Drug abuse: cocaine and BZD's were+ on drug screen DKA w/ coma: per CCM UTI/ sepsis: w/ borderline hypotension, on IV rocephin  Acute hypoxic resp failure - on vent LUE AVF: suspected AVF, good bruit, per  family was placed/ created about 1 month ago when she was in [place]  Subjective: Intubated for airway protection but not on pressors.   Chemistry and CBC: Creatinine, Ser  Date/Time Value Ref Range Status  09/26/2023 06:22 AM 2.90 (H) 0.44 - 1.00 mg/dL Final  16/02/9603 54:09 PM 2.81 (H) 0.44 - 1.00 mg/dL Final  81/19/1478 29:56 PM 2.72 (H) 0.44 - 1.00 mg/dL Final  21/30/8657 84:69 PM 2.77 (H) 0.44 - 1.00 mg/dL Final  62/95/2841 32:44 AM 2.73 (H) 0.44 - 1.00 mg/dL Final  05/30/7251 66:44 AM 2.66 (H) 0.44 - 1.00 mg/dL Final  03/47/4259 56:38 PM 2.58 (H) 0.44 - 1.00 mg/dL Final  75/64/3329 51:88 PM 2.64 (H) 0.44 - 1.00 mg/dL Final  41/66/0630 16:01 PM 1.09 (H) 0.44 - 1.00 mg/dL Final  09/32/3557 32:20 PM 0.99 0.50 - 1.10 mg/dL Final   Recent Labs  Lab 09/24/23 2331 09/25/23 0218 09/25/23 0447 09/25/23 0955 09/25/23 1626 09/25/23 1931 09/25/23 2317 09/26/23 0622  NA 136 137 137 138 138 139 140 138  K 3.3* 3.0* 3.4* 3.1* 4.1 4.4 5.0 5.7*  CL 97*  --  103 102 104 104 105 106  CO2 21*  --  18* 20* 19* 21* 22 21*  GLUCOSE 723*  --  431* 251* 165* 152* 138* 132*  BUN 19  --  18 18 19  19  20 21  CREATININE 2.58*  --  2.66* 2.73* 2.77* 2.72* 2.81* 2.90*  CALCIUM  8.8*  --  8.3* 8.4* 8.4* 8.4* 8.4* 8.2*   Recent Labs  Lab 09/24/23 1534 09/25/23 0218 09/25/23 0447 09/25/23 0955  WBC 13.0*  --  8.6 6.2  NEUTROABS 11.4*  --  6.3  --   HGB 12.0 8.8* 12.7 11.3*  HCT 38.7 26.0* 38.2 32.8*  MCV 100.0  --  94.3 93.2  PLT 277  --  235 138*   Liver Function Tests: Recent Labs  Lab 09/24/23 1534  AST 19  ALT 12  ALKPHOS 136*  BILITOT 0.6  PROT 7.6  ALBUMIN 3.6   No results for input(s): "LIPASE", "AMYLASE" in the last 168 hours. Recent Labs  Lab 09/25/23 0447  AMMONIA 28   Cardiac Enzymes: Recent Labs  Lab 09/24/23 1534  CKTOTAL 121   Iron Studies: No results for input(s): "IRON", "TIBC", "TRANSFERRIN", "FERRITIN" in the last 72  hours. PT/INR: @LABRCNTIP (inr:5)  Xrays/Other Studies: ) Results for orders placed or performed during the hospital encounter of 09/24/23 (from the past 48 hours)  CBG monitoring, ED     Status: Abnormal   Collection Time: 09/24/23  3:19 PM  Result Value Ref Range   Glucose-Capillary >600 (HH) 70 - 99 mg/dL    Comment: Glucose reference range applies only to samples taken after fasting for at least 8 hours.  Comprehensive metabolic panel     Status: Abnormal   Collection Time: 09/24/23  3:34 PM  Result Value Ref Range   Sodium 129 (L) 135 - 145 mmol/L   Potassium 3.5 3.5 - 5.1 mmol/L   Chloride 91 (L) 98 - 111 mmol/L   CO2 22 22 - 32 mmol/L   Glucose, Bld 989 (HH) 70 - 99 mg/dL    Comment: CRITICAL RESULT CALLED TO, READ BACK BY AND VERIFIED WITH LONG,L ON 09/24/23 AT 1650 BY LOY,C Glucose reference range applies only to samples taken after fasting for at least 8 hours.    BUN 19 8 - 23 mg/dL   Creatinine, Ser 2.95 (H) 0.44 - 1.00 mg/dL   Calcium  8.6 (L) 8.9 - 10.3 mg/dL   Total Protein 7.6 6.5 - 8.1 g/dL   Albumin 3.6 3.5 - 5.0 g/dL   AST 19 15 - 41 U/L   ALT 12 0 - 44 U/L   Alkaline Phosphatase 136 (H) 38 - 126 U/L   Total Bilirubin 0.6 0.0 - 1.2 mg/dL   GFR, Estimated 19 (L) >60 mL/min    Comment: (NOTE) Calculated using the CKD-EPI Creatinine Equation (2021)    Anion gap 16 (H) 5 - 15    Comment: Performed at [facility], [address], [place], [place] [address]  CBC with Differential     Status: Abnormal   Collection Time: 09/24/23  3:34 PM  Result Value Ref Range   WBC 13.0 (H) 4.0 - 10.5 K/uL   RBC 3.87 3.87 - 5.11 MIL/uL   Hemoglobin 12.0 12.0 - 15.0 g/dL   HCT 86.5 78.4 - 69.6 %   MCV 100.0 80.0 - 100.0 fL   MCH 31.0 26.0 - 34.0 pg   MCHC 31.0 30.0 - 36.0 g/dL   RDW 29.5 28.4 - 13.2 %   Platelets 277 150 - 400 K/uL   nRBC 0.0 0.0 - 0.2 %   Neutrophils Relative % 86 %   Neutro Abs 11.4 (H) 1.7 - 7.7 K/uL   Lymphocytes Relative 7 %   Lymphs  Abs 0.9 0.7 -  4.0 K/uL   Monocytes Relative 5 %   Monocytes Absolute 0.6 0.1 - 1.0 K/uL   Eosinophils Relative 0 %   Eosinophils Absolute 0.0 0.0 - 0.5 K/uL   Basophils Relative 1 %   Basophils Absolute 0.1 0.0 - 0.1 K/uL   Immature Granulocytes 1 %   Abs Immature Granulocytes 0.06 0.00 - 0.07 K/uL    Comment: Performed at [facility], [address], [place], [place] [address]  Protime-INR     Status: None   Collection Time: 09/24/23  3:34 PM  Result Value Ref Range   Prothrombin Time 13.4 11.4 - 15.2 seconds   INR 1.0 0.8 - 1.2    Comment: (NOTE) INR goal varies based on device and disease states. Performed at [facility], [address] [place], [place] [address]   CK     Status: None   Collection Time: 09/24/23  3:34 PM  Result Value Ref Range   Total CK 121 38 - 234 U/L    Comment: Performed at [facility], [address] [place], [place] [address]  Troponin I (High Sensitivity)     Status: Abnormal   Collection Time: 09/24/23  3:34 PM  Result Value Ref Range   Troponin I (High Sensitivity) 114 (HH) <18 ng/L    Comment: CRITICAL RESULT CALLED TO, READ BACK BY AND VERIFIED WITH LONG,L ON 09/24/23 AT 1650 BY LOY,C (NOTE) Elevated high sensitivity troponin I (hsTnI) values and significant  changes across serial measurements may suggest ACS but many other  chronic and acute conditions are known to elevate hsTnI results.  Refer to the "Links" section for chest pain algorithms and additional  guidance. Performed at [facility], [address] [place], [place] [address]   Blood gas, venous     Status: Abnormal   Collection Time: 09/24/23  3:35 PM  Result Value Ref Range   pH, Ven 7.08 (LL) 7.25 - 7.43    Comment: CRITICAL RESULT CALLED TO, READ BACK BY AND VERIFIED WITH: BELTON,C ON 09/24/23 AT 1606 BY LOY,C    pCO2, Ven 81 (HH) 44 - 60 mmHg    Comment: CRITICAL RESULT CALLED TO, READ BACK BY AND VERIFIED WITH: BELTON,C ON 09/24/23 AT 1607 BY LOY,C    pO2, Ven 36 32 - 45 mmHg    Bicarbonate 24.3 20.0 - 28.0 mmol/L   Acid-base deficit 7.1 (H) 0.0 - 2.0 mmol/L   O2 Saturation 59.9 %   Patient temperature 36.7    Collection site RIGHT ANTECUBITAL    Drawn by [phone]     Comment: Performed at [facility], [address], [place], [place] [address]  Beta-hydroxybutyric acid     Status: Abnormal   Collection Time: 09/24/23  3:35 PM  Result Value Ref Range   Beta-Hydroxybutyric Acid 4.15 (H) 0.05 - 0.27 mmol/L    Comment: RESULTS CONFIRMED BY MANUAL DILUTION Performed at [facility], [address] [place], [place] [address]   Ethanol     Status: None   Collection Time: 09/24/23  3:35 PM  Result Value Ref Range   Alcohol, Ethyl (B) <15 <15 mg/dL    Comment: Please note change in reference range. (NOTE) For medical purposes only. Performed at [facility], [address], [place], [place] [address]   Magnesium      Status: None   Collection Time: 09/24/23  3:37 PM  Result Value Ref Range   Magnesium  2.2 1.7 - 2.4 mg/dL    Comment:  Performed at [facility], [address] [place], [place] [address]  Lactic acid, plasma     Status: Abnormal   Collection Time: 09/24/23  4:01 PM  Result Value Ref Range   Lactic Acid, Venous 2.8 (HH) 0.5 - 1.9 mmol/L    Comment: CRITICAL RESULT CALLED TO, READ BACK BY AND VERIFIED WITH LONG,L ON 09/24/23 AT  1650 BY LOY,C Performed at [facility], [address] [place], [place] [address]   CBG monitoring, ED     Status: Abnormal   Collection Time: 09/24/23  5:14 PM  Result Value Ref Range   Glucose-Capillary >600 (HH) 70 - 99 mg/dL    Comment: Glucose reference range applies only to samples taken after fasting for at least 8 hours.  Urinalysis, w/ Reflex to Culture (Infection Suspected) -Urine, Clean Catch     Status: Abnormal   Collection Time: 09/24/23  5:35 PM  Result Value Ref Range   Specimen Source URINE, CLEAN CATCH    Color, Urine YELLOW YELLOW   APPearance CLOUDY (A) CLEAR   Specific Gravity, Urine 1.021 1.005 -  1.030   pH 5.0 5.0 - 8.0   Glucose, UA >=500 (A) NEGATIVE mg/dL   Hgb urine dipstick MODERATE (A) NEGATIVE   Bilirubin Urine NEGATIVE NEGATIVE   Ketones, ur 5 (A) NEGATIVE mg/dL   Protein, ur >=098 (A) NEGATIVE mg/dL   Nitrite NEGATIVE NEGATIVE   Leukocytes,Ua MODERATE (A) NEGATIVE   RBC / HPF 0-5 0 - 5 RBC/hpf   WBC, UA >50 0 - 5 WBC/hpf    Comment:        Reflex urine culture not performed if WBC <=10, OR if Squamous epithelial cells >5. If Squamous epithelial cells >5 suggest recollection.    Bacteria, UA MANY (A) NONE SEEN   Squamous Epithelial / HPF 0-5 0 - 5 /HPF   WBC Clumps PRESENT    Mucus PRESENT    Budding Yeast PRESENT     Comment: Performed at [facility], [address] [place], [place] [address]  Urine rapid drug screen (hosp performed)     Status: Abnormal   Collection Time: 09/24/23  5:35 PM  Result Value Ref Range   Opiates NONE DETECTED NONE DETECTED   Cocaine POSITIVE (A) NONE DETECTED   Benzodiazepines POSITIVE (A) NONE DETECTED   Amphetamines NONE DETECTED NONE DETECTED   Tetrahydrocannabinol NONE DETECTED NONE DETECTED   Barbiturates NONE DETECTED NONE DETECTED    Comment: (NOTE) DRUG SCREEN FOR MEDICAL PURPOSES ONLY.  IF CONFIRMATION IS NEEDED FOR ANY PURPOSE, NOTIFY LAB WITHIN 5 DAYS.  LOWEST DETECTABLE LIMITS FOR URINE DRUG SCREEN Drug Class                     Cutoff (ng/mL) Amphetamine and metabolites    1000 Barbiturate and metabolites    200 Benzodiazepine                 200 Opiates and metabolites        300 Cocaine and metabolites        300 THC                            50 Performed at [facility], [address] [place], [place] [address]   Urine Culture     Status: Abnormal   Collection Time: 09/24/23  5:35 PM   Specimen: Urine, Random  Result Value Ref Range   Specimen Description  URINE, RANDOM Performed at [facility], [address] [place], [place] [address]    Special Requests      NONE Reflexed from  [phone] Performed at [facility], [address] [place], [place] [address]    Culture (A)     >=100,000 COLONIES/mL LACTOBACILLUS SPECIES Standardized susceptibility testing for this organism is not available. Performed at [facility], [address] [place], [place] [address]    Report Status 09/25/2023 FINAL   Lactic acid, plasma     Status: Abnormal   Collection Time: 09/24/23  5:41 PM  Result Value Ref Range   Lactic Acid, Venous 2.9 (HH) 0.5 - 1.9 mmol/L    Comment: CRITICAL VALUE NOTED.  VALUE IS CONSISTENT WITH PREVIOUSLY REPORTED AND CALLED VALUE. Performed at [facility], [address], [place], [place] [address]   Osmolality     Status: Abnormal   Collection Time: 09/24/23  5:41 PM  Result Value Ref Range   Osmolality 349 (HH) 275 - 295 mOsm/kg    Comment: REPEATED TO VERIFY CRITICAL RESULT CALLED TO, READ BACK BY AND VERIFIED WITH: MOSCELLER,M RN 09/24/2023 AT 2151 SKEEN,P Performed at [facility], [address] [place], [place] [address]   Troponin I (High Sensitivity)     Status: Abnormal   Collection Time: 09/24/23  5:41 PM  Result Value Ref Range   Troponin I (High Sensitivity) 136 (HH) <18 ng/L    Comment: DELTA CHECK NOTED CRITICAL VALUE NOTED. VALUE IS CONSISTENT WITH PREVIOUSLY REPORTED/CALLED VALUE (NOTE) Elevated high sensitivity troponin I (hsTnI) values and significant  changes across serial measurements may suggest ACS but many other  chronic and acute conditions are known to elevate hsTnI results.  Refer to the "Links" section for chest pain algorithms and additional  guidance. Performed at [facility], [address], [place], [place] [address]   CBG monitoring, ED     Status: Abnormal   Collection Time: 09/24/23  5:59 PM  Result Value Ref Range   Glucose-Capillary >600 (HH) 70 - 99 mg/dL    Comment: Glucose reference range applies only to samples taken after fasting for at least 8 hours.  Blood gas, arterial     Status:  Abnormal   Collection Time: 09/24/23  6:33 PM  Result Value Ref Range   pH, Arterial 7.42 7.35 - 7.45   pCO2 arterial 29 (L) 32 - 48 mmHg   pO2, Arterial 130 (H) 83 - 108 mmHg   Bicarbonate 19.2 (L) 20.0 - 28.0 mmol/L   Acid-base deficit 4.7 (H) 0.0 - 2.0 mmol/L   O2 Saturation 99 %   Patient temperature 35.7    Collection site RIGHT RADIAL    Drawn by [id number]    Allens test (pass/fail) PASS PASS    Comment: Performed at [facility], [address], [place], [place] [address]  CBG monitoring, ED     Status: Abnormal   Collection Time: 09/24/23  6:33 PM  Result Value Ref Range   Glucose-Capillary >600 (HH) 70 - 99 mg/dL    Comment: Glucose reference range applies only to samples taken after fasting for at least 8 hours.  CBG monitoring, ED     Status: Abnormal   Collection Time: 09/24/23  7:07 PM  Result Value Ref Range   Glucose-Capillary >600 (HH) 70 - 99 mg/dL    Comment: Glucose reference range applies only to samples taken after fasting for at least 8 hours.  Culture, blood (routine x 2)  Status: None (Preliminary result)   Collection Time: 09/24/23  7:41 PM   Specimen: BLOOD  Result Value Ref Range   Specimen Description BLOOD BLOOD RIGHT FOREARM    Special Requests      BOTTLES DRAWN AEROBIC AND ANAEROBIC Blood Culture results may not be optimal due to an inadequate volume of blood received in culture bottles   Culture      NO GROWTH 2 DAYS Performed at [facility], [address], [place], [place] [address]    Report Status PENDING   Culture, blood (routine x 2)     Status: None (Preliminary result)   Collection Time: 09/24/23  7:41 PM   Specimen: BLOOD  Result Value Ref Range   Specimen Description BLOOD BLOOD RIGHT HAND    Special Requests      AEROBIC BOTTLE ONLY Blood Culture results may not be optimal due to an inadequate volume of blood received in culture bottles   Culture      NO GROWTH 2 DAYS Performed at [facility], [address], [place], [place]  [address]    Report Status PENDING   CBG monitoring, ED     Status: Abnormal   Collection Time: 09/24/23  7:52 PM  Result Value Ref Range   Glucose-Capillary >600 (HH) 70 - 99 mg/dL    Comment: Glucose reference range applies only to samples taken after fasting for at least 8 hours.  Troponin I (High Sensitivity)     Status: Abnormal   Collection Time: 09/24/23  7:53 PM  Result Value Ref Range   Troponin I (High Sensitivity) 164 (HH) <18 ng/L    Comment: DELTA CHECK NOTED CRITICAL VALUE NOTED. VALUE IS CONSISTENT WITH PREVIOUSLY REPORTED/CALLED VALUE (NOTE) Elevated high sensitivity troponin I (hsTnI) values and significant  changes across serial measurements may suggest ACS but many other  chronic and acute conditions are known to elevate hsTnI results.  Refer to the "Links" section for chest pain algorithms and additional  guidance. Performed at [facility], [address] [place], [place] [address]   Lactic acid, plasma     Status: Abnormal   Collection Time: 09/24/23  7:53 PM  Result Value Ref Range   Lactic Acid, Venous 3.5 (HH) 0.5 - 1.9 mmol/L    Comment: CRITICAL VALUE NOTED.  VALUE IS CONSISTENT WITH PREVIOUSLY REPORTED AND CALLED VALUE. Performed at [facility], [address] [place], [place] [address]   CBG monitoring, ED     Status: Abnormal   Collection Time: 09/24/23  8:36 PM  Result Value Ref Range   Glucose-Capillary >600 (HH) 70 - 99 mg/dL    Comment: Glucose reference range applies only to samples taken after fasting for at least 8 hours.  CBG monitoring, ED     Status: Abnormal   Collection Time: 09/24/23  9:06 PM  Result Value Ref Range   Glucose-Capillary >600 (HH) 70 - 99 mg/dL    Comment: Glucose reference range applies only to samples taken after fasting for at least 8 hours.  Lactic acid, plasma     Status: Abnormal   Collection Time: 09/24/23  9:24 PM  Result Value Ref Range   Lactic Acid, Venous 4.2 (HH) 0.5 - 1.9 mmol/L    Comment: CRITICAL VALUE  NOTED.  VALUE IS CONSISTENT WITH PREVIOUSLY REPORTED AND CALLED VALUE. Performed at [facility], [address] [place], [place] [address]   Troponin I (High Sensitivity)     Status: Abnormal   Collection Time: 09/24/23  9:24  PM  Result Value Ref Range   Troponin I (High Sensitivity) 180 (HH) <18 ng/L    Comment: CRITICAL VALUE NOTED.  VALUE IS CONSISTENT WITH PREVIOUSLY REPORTED AND CALLED VALUE. (NOTE) Elevated high sensitivity troponin I (hsTnI) values and significant  changes across serial measurements may suggest ACS but many other  chronic and acute conditions are known to elevate hsTnI results.  Refer to the "Links" section for chest pain algorithms and additional  guidance. Performed at [facility], [address], [place], [place] [address]   CBG monitoring, ED     Status: Abnormal   Collection Time: 09/24/23  9:42 PM  Result Value Ref Range   Glucose-Capillary >600 (HH) 70 - 99 mg/dL    Comment: Glucose reference range applies only to samples taken after fasting for at least 8 hours.  MRSA Next Gen by PCR, Nasal     Status: None   Collection Time: 09/24/23 10:49 PM   Specimen: Nasal Mucosa; Nasal Swab  Result Value Ref Range   MRSA by PCR Next Gen NOT DETECTED NOT DETECTED    Comment: (NOTE) The GeneXpert MRSA Assay (FDA approved for NASAL specimens only), is one component of a comprehensive MRSA colonization surveillance program. It is not intended to diagnose MRSA infection nor to guide or monitor treatment for MRSA infections. Test performance is not FDA approved in patients less than 72 years old. Performed at [facility], [address], [place], [place] [address]   Glucose, capillary     Status: Abnormal   Collection Time: 09/24/23 10:55 PM  Result Value Ref Range   Glucose-Capillary >600 (HH) 70 - 99 mg/dL    Comment: Glucose reference range applies only to samples taken after fasting for at least 8 hours.  Glucose, capillary     Status: Abnormal    Collection Time: 09/24/23 11:26 PM  Result Value Ref Range   Glucose-Capillary >600 (HH) 70 - 99 mg/dL    Comment: Glucose reference range applies only to samples taken after fasting for at least 8 hours.  Basic metabolic panel     Status: Abnormal   Collection Time: 09/24/23 11:31 PM  Result Value Ref Range   Sodium 136 135 - 145 mmol/L    Comment: DELTA CHECK NOTED   Potassium 3.3 (L) 3.5 - 5.1 mmol/L   Chloride 97 (L) 98 - 111 mmol/L   CO2 21 (L) 22 - 32 mmol/L   Glucose, Bld 723 (HH) 70 - 99 mg/dL    Comment: CRITICAL RESULT CALLED TO, READ BACK BY AND VERIFIED WITH D.HAGGERTY, RN @0018  04.30.25 AALTOM Glucose reference range applies only to samples taken after fasting for at least 8 hours.    BUN 19 8 - 23 mg/dL   Creatinine, Ser 0.98 (H) 0.44 - 1.00 mg/dL   Calcium  8.8 (L) 8.9 - 10.3 mg/dL   GFR, Estimated 20 (L) >60 mL/min    Comment: (NOTE) Calculated using the CKD-EPI Creatinine Equation (2021)    Anion gap 18 (H) 5 - 15    Comment: Performed at [facility], [address] [place], [place] [address]  Lactic acid, plasma     Status: Abnormal   Collection Time: 09/24/23 11:31 PM  Result Value Ref Range   Lactic Acid, Venous 5.5 (HH) 0.5 - 1.9 mmol/L    Comment: CRITICAL RESULT CALLED TO, READ BACK BY AND VERIFIED WITH D.HAGGERTY, RN @0009  04.30.25 AALTOM Performed at [facility], [address], [place], [place]  [address]   Culture, Respiratory w Gram Stain     Status: None (Preliminary result)   Collection Time: 09/25/23 12:23 AM   Specimen: Tracheal Aspirate; Respiratory  Result Value Ref Range   Specimen Description TRACHEAL ASPIRATE    Special Requests NONE    Gram Stain      MODERATE WBC PRESENT,BOTH PMN AND MONONUCLEAR MODERATE GRAM POSITIVE COCCI IN PAIRS Performed at [facility], [address], [place], [place] [address]    Culture PENDING    Report Status PENDING   Glucose, capillary     Status: Abnormal   Collection Time: 09/25/23   1:22 AM  Result Value Ref Range   Glucose-Capillary 522 (HH) 70 - 99 mg/dL    Comment: Glucose reference range applies only to samples taken after fasting for at least 8 hours.  Glucose, capillary     Status: Abnormal   Collection Time: 09/25/23  2:00 AM  Result Value Ref Range   Glucose-Capillary 524 (HH) 70 - 99 mg/dL    Comment: Glucose reference range applies only to samples taken after fasting for at least 8 hours.  I-STAT 7, (LYTES, BLD GAS, ICA, H+H)     Status: Abnormal   Collection Time: 09/25/23  2:18 AM  Result Value Ref Range   pH, Arterial 7.407 7.35 - 7.45   pCO2 arterial 32.0 32 - 48 mmHg   pO2, Arterial 127 (H) 83 - 108 mmHg   Bicarbonate 20.2 20.0 - 28.0 mmol/L   TCO2 21 (L) 22 - 32 mmol/L   O2 Saturation 99 %   Acid-base deficit 4.0 (H) 0.0 - 2.0 mmol/L   Sodium 137 135 - 145 mmol/L   Potassium 3.0 (L) 3.5 - 5.1 mmol/L   Calcium , Ion 1.16 1.15 - 1.40 mmol/L   HCT 26.0 (L) 36.0 - 46.0 %   Hemoglobin 8.8 (L) 12.0 - 15.0 g/dL   Patient temperature 95.2 C    Collection site RADIAL, ALLEN'S TEST ACCEPTABLE    Drawn by RT    Sample type ARTERIAL   Glucose, capillary     Status: Abnormal   Collection Time: 09/25/23  3:33 AM  Result Value Ref Range   Glucose-Capillary 439 (H) 70 - 99 mg/dL    Comment: Glucose reference range applies only to samples taken after fasting for at least 8 hours.  Glucose, capillary     Status: Abnormal   Collection Time: 09/25/23  4:46 AM  Result Value Ref Range   Glucose-Capillary 370 (H) 70 - 99 mg/dL    Comment: Glucose reference range applies only to samples taken after fasting for at least 8 hours.  Lactic acid, plasma     Status: Abnormal   Collection Time: 09/25/23  4:47 AM  Result Value Ref Range   Lactic Acid, Venous 6.6 (HH) 0.5 - 1.9 mmol/L    Comment: CRITICAL VALUE NOTED. VALUE IS CONSISTENT WITH PREVIOUSLY REPORTED/CALLED VALUE Performed at [facility], [address], [place], [place] [address]   Basic metabolic  panel     Status: Abnormal   Collection Time: 09/25/23  4:47 AM  Result Value Ref Range   Sodium 137 135 - 145 mmol/L   Potassium 3.4 (L) 3.5 - 5.1 mmol/L   Chloride 103 98 - 111 mmol/L   CO2 18 (L) 22 - 32 mmol/L   Glucose, Bld 431 (H) 70 - 99 mg/dL    Comment: Glucose reference range applies only to samples taken after fasting for at least  8 hours.   BUN 18 8 - 23 mg/dL   Creatinine, Ser 1.61 (H) 0.44 - 1.00 mg/dL   Calcium  8.3 (L) 8.9 - 10.3 mg/dL   GFR, Estimated 19 (L) >60 mL/min    Comment: (NOTE) Calculated using the CKD-EPI Creatinine Equation (2021)    Anion gap 16 (H) 5 - 15    Comment: Performed at [facility], [address], [place], [place] [address]  CBC with Differential/Platelet     Status: None   Collection Time: 09/25/23  4:47 AM  Result Value Ref Range   WBC 8.6 4.0 - 10.5 K/uL   RBC 4.05 3.87 - 5.11 MIL/uL   Hemoglobin 12.7 12.0 - 15.0 g/dL   HCT 54.0 98.1 - 19.1 %   MCV 94.3 80.0 - 100.0 fL   MCH 31.4 26.0 - 34.0 pg   MCHC 33.2 30.0 - 36.0 g/dL   RDW 47.8 29.5 - 62.1 %   Platelets 235 150 - 400 K/uL   nRBC 0.0 0.0 - 0.2 %   Neutrophils Relative % 73 %   Neutro Abs 6.3 1.7 - 7.7 K/uL   Lymphocytes Relative 23 %   Lymphs Abs 2.0 0.7 - 4.0 K/uL   Monocytes Relative 4 %   Monocytes Absolute 0.3 0.1 - 1.0 K/uL   Eosinophils Relative 0 %   Eosinophils Absolute 0.0 0.0 - 0.5 K/uL   Basophils Relative 0 %   Basophils Absolute 0.0 0.0 - 0.1 K/uL   WBC Morphology See Note     Comment: Morphology unremarkable   RBC Morphology See Note     Comment: Morphology unremarkable   nRBC 0 0 /100 WBC   Abs Immature Granulocytes 0.00 0.00 - 0.07 K/uL    Comment: Performed at [facility], [address] [place], [place] [address]  Troponin I (High Sensitivity)     Status: Abnormal   Collection Time: 09/25/23  4:47 AM  Result Value Ref Range   Troponin I (High Sensitivity) 239 (HH) <18 ng/L    Comment: CRITICAL RESULT CALLED TO, READ BACK BY AND  VERIFIED WITH JOSH PIERCE, RN @0553  04.30.25 AALTOM (NOTE) Elevated high sensitivity troponin I (hsTnI) values and significant  changes across serial measurements may suggest ACS but many other  chronic and acute conditions are known to elevate hsTnI results.  Refer to the "Links" section for chest pain algorithms and additional  guidance. Performed at [facility], [address] [place], [place] [address]   HIV Antibody (routine testing w rflx)     Status: None   Collection Time: 09/25/23  4:47 AM  Result Value Ref Range   HIV Screen 4th Generation wRfx Non Reactive Non Reactive    Comment: Performed at [facility], [address] [place], [place] [address]  Ammonia     Status: None   Collection Time: 09/25/23  4:47 AM  Result Value Ref Range   Ammonia 28 9 - 35 umol/L    Comment: Performed at [facility], [address], [place], [place] [address]  Beta-hydroxybutyric acid     Status: Abnormal   Collection Time: 09/25/23  4:47 AM  Result Value Ref Range   Beta-Hydroxybutyric Acid 0.83 (H) 0.05 - 0.27 mmol/L    Comment: Performed at [facility], [address], [place], [place] [address]  Glucose, capillary     Status: Abnormal   Collection Time: 09/25/23  6:04 AM  Result Value Ref  Range   Glucose-Capillary 286 (H) 70 - 99 mg/dL    Comment: Glucose reference range applies only to samples taken after fasting for at least 8 hours.  Glucose, capillary     Status: Abnormal   Collection Time: 09/25/23  6:49 AM  Result Value Ref Range   Glucose-Capillary 233 (H) 70 - 99 mg/dL    Comment: Glucose reference range applies only to samples taken after fasting for at least 8 hours.  Glucose, capillary     Status: Abnormal   Collection Time: 09/25/23  7:59 AM  Result Value Ref Range   Glucose-Capillary 175 (H) 70 - 99 mg/dL    Comment: Glucose reference range applies only to samples taken after fasting for at least 8 hours.  Glucose, capillary     Status:  Abnormal   Collection Time: 09/25/23  9:20 AM  Result Value Ref Range   Glucose-Capillary 179 (H) 70 - 99 mg/dL    Comment: Glucose reference range applies only to samples taken after fasting for at least 8 hours.  Basic metabolic panel     Status: Abnormal   Collection Time: 09/25/23  9:55 AM  Result Value Ref Range   Sodium 138 135 - 145 mmol/L   Potassium 3.1 (L) 3.5 - 5.1 mmol/L   Chloride 102 98 - 111 mmol/L   CO2 20 (L) 22 - 32 mmol/L   Glucose, Bld 251 (H) 70 - 99 mg/dL    Comment: Glucose reference range applies only to samples taken after fasting for at least 8 hours.   BUN 18 8 - 23 mg/dL   Creatinine, Ser 1.61 (H) 0.44 - 1.00 mg/dL   Calcium  8.4 (L) 8.9 - 10.3 mg/dL   GFR, Estimated 18 (L) >60 mL/min    Comment: (NOTE) Calculated using the CKD-EPI Creatinine Equation (2021)    Anion gap 16 (H) 5 - 15    Comment: Performed at [facility], [address], [place], [place] [address]  CBC     Status: Abnormal   Collection Time: 09/25/23  9:55 AM  Result Value Ref Range   WBC 6.2 4.0 - 10.5 K/uL   RBC 3.52 (L) 3.87 - 5.11 MIL/uL   Hemoglobin 11.3 (L) 12.0 - 15.0 g/dL   HCT 54.0 (L) 98.1 - 19.1 %   MCV 93.2 80.0 - 100.0 fL   MCH 32.1 26.0 - 34.0 pg   MCHC 34.5 30.0 - 36.0 g/dL   RDW 47.8 29.5 - 62.1 %   Platelets 138 (L) 150 - 400 K/uL    Comment: REPEATED TO VERIFY   nRBC 0.0 0.0 - 0.2 %    Comment: Performed at [facility], [address] [place], [place] [address]  Magnesium      Status: None   Collection Time: 09/25/23  9:55 AM  Result Value Ref Range   Magnesium  1.8 1.7 - 2.4 mg/dL    Comment: Performed at [facility], [address], [place], [place] [address]  Beta-hydroxybutyric acid     Status: Abnormal   Collection Time: 09/25/23  9:55 AM  Result Value Ref Range   Beta-Hydroxybutyric Acid 0.43 (H) 0.05 - 0.27 mmol/L    Comment: Performed at [facility], [address] [place], [place] [address]  Troponin I (High Sensitivity)      Status: Abnormal   Collection Time: 09/25/23  9:55 AM  Result Value Ref Range   Troponin I (High Sensitivity) 232 (HH) <18 ng/L  Comment: CRITICAL VALUE NOTED. VALUE IS CONSISTENT WITH PREVIOUSLY REPORTED/CALLED VALUE (NOTE) Elevated high sensitivity troponin I (hsTnI) values and significant  changes across serial measurements may suggest ACS but many other  chronic and acute conditions are known to elevate hsTnI results.  Refer to the "Links" section for chest pain algorithms and additional  guidance. Performed at [facility], [address], [place], [place] [address]   Hemoglobin A1c     Status: Abnormal   Collection Time: 09/25/23  9:55 AM  Result Value Ref Range   Hgb A1c MFr Bld 14.3 (H) 4.8 - 5.6 %    Comment: (NOTE) Pre diabetes:          5.7%-6.4%  Diabetes:              >6.4%  Glycemic control for   <7.0% adults with diabetes    Mean Plasma Glucose 363.71 mg/dL    Comment: Performed at [facility], [address] [place], [place] [address]  Glucose, capillary     Status: Abnormal   Collection Time: 09/25/23 10:03 AM  Result Value Ref Range   Glucose-Capillary 137 (H) 70 - 99 mg/dL    Comment: Glucose reference range applies only to samples taken after fasting for at least 8 hours.  Glucose, capillary     Status: Abnormal   Collection Time: 09/25/23 11:06 AM  Result Value Ref Range   Glucose-Capillary 127 (H) 70 - 99 mg/dL    Comment: Glucose reference range applies only to samples taken after fasting for at least 8 hours.  Glucose, capillary     Status: Abnormal   Collection Time: 09/25/23 12:01 PM  Result Value Ref Range   Glucose-Capillary 138 (H) 70 - 99 mg/dL    Comment: Glucose reference range applies only to samples taken after fasting for at least 8 hours.  Beta-hydroxybutyric acid     Status: Abnormal   Collection Time: 09/25/23 12:15 PM  Result Value Ref Range   Beta-Hydroxybutyric Acid 0.36 (H) 0.05 - 0.27 mmol/L    Comment:  Performed at [facility], [address] [place], [place] [address]  Glucose, capillary     Status: Abnormal   Collection Time: 09/25/23 12:58 PM  Result Value Ref Range   Glucose-Capillary 136 (H) 70 - 99 mg/dL    Comment: Glucose reference range applies only to samples taken after fasting for at least 8 hours.  Glucose, capillary     Status: Abnormal   Collection Time: 09/25/23  2:56 PM  Result Value Ref Range   Glucose-Capillary 132 (H) 70 - 99 mg/dL    Comment: Glucose reference range applies only to samples taken after fasting for at least 8 hours.  Basic metabolic panel     Status: Abnormal   Collection Time: 09/25/23  4:26 PM  Result Value Ref Range   Sodium 138 135 - 145 mmol/L   Potassium 4.1 3.5 - 5.1 mmol/L   Chloride 104 98 - 111 mmol/L   CO2 19 (L) 22 - 32 mmol/L   Glucose, Bld 165 (H) 70 - 99 mg/dL    Comment: Glucose reference range applies only to samples taken after fasting for at least 8 hours.   BUN 19 8 - 23 mg/dL   Creatinine, Ser 9.14 (H) 0.44 - 1.00 mg/dL   Calcium  8.4 (L) 8.9 - 10.3 mg/dL   GFR, Estimated 18 (L) >60 mL/min    Comment: (NOTE) Calculated using the CKD-EPI Creatinine Equation (2021)  Anion gap 15 5 - 15    Comment: Performed at [facility], [address] [place], [place] [address]  Beta-hydroxybutyric acid     Status: None   Collection Time: 09/25/23  4:26 PM  Result Value Ref Range   Beta-Hydroxybutyric Acid 0.19 0.05 - 0.27 mmol/L    Comment: Performed at [facility], [address] [place], [place] [address]  Glucose, capillary     Status: Abnormal   Collection Time: 09/25/23  4:50 PM  Result Value Ref Range   Glucose-Capillary 125 (H) 70 - 99 mg/dL    Comment: Glucose reference range applies only to samples taken after fasting for at least 8 hours.  Glucose, capillary     Status: Abnormal   Collection Time: 09/25/23  6:42 PM  Result Value Ref Range   Glucose-Capillary 121 (H) 70 - 99 mg/dL    Comment:  Glucose reference range applies only to samples taken after fasting for at least 8 hours.  Basic metabolic panel     Status: Abnormal   Collection Time: 09/25/23  7:31 PM  Result Value Ref Range   Sodium 139 135 - 145 mmol/L   Potassium 4.4 3.5 - 5.1 mmol/L   Chloride 104 98 - 111 mmol/L   CO2 21 (L) 22 - 32 mmol/L   Glucose, Bld 152 (H) 70 - 99 mg/dL    Comment: Glucose reference range applies only to samples taken after fasting for at least 8 hours.   BUN 19 8 - 23 mg/dL   Creatinine, Ser 0.98 (H) 0.44 - 1.00 mg/dL   Calcium  8.4 (L) 8.9 - 10.3 mg/dL   GFR, Estimated 18 (L) >60 mL/min    Comment: (NOTE) Calculated using the CKD-EPI Creatinine Equation (2021)    Anion gap 14 5 - 15    Comment: Performed at [facility], [address], [place], [place] [address]  Glucose, capillary     Status: Abnormal   Collection Time: 09/25/23  8:11 PM  Result Value Ref Range   Glucose-Capillary 111 (H) 70 - 99 mg/dL    Comment: Glucose reference range applies only to samples taken after fasting for at least 8 hours.  Glucose, capillary     Status: Abnormal   Collection Time: 09/25/23  9:01 PM  Result Value Ref Range   Glucose-Capillary 130 (H) 70 - 99 mg/dL    Comment: Glucose reference range applies only to samples taken after fasting for at least 8 hours.  Glucose, capillary     Status: Abnormal   Collection Time: 09/25/23 10:01 PM  Result Value Ref Range   Glucose-Capillary 109 (H) 70 - 99 mg/dL    Comment: Glucose reference range applies only to samples taken after fasting for at least 8 hours.  Glucose, capillary     Status: Abnormal   Collection Time: 09/25/23 11:03 PM  Result Value Ref Range   Glucose-Capillary 132 (H) 70 - 99 mg/dL    Comment: Glucose reference range applies only to samples taken after fasting for at least 8 hours.  Basic metabolic panel     Status: Abnormal   Collection Time: 09/25/23 11:17 PM  Result Value Ref Range   Sodium 140 135 - 145 mmol/L    Potassium 5.0 3.5 - 5.1 mmol/L   Chloride 105 98 - 111 mmol/L   CO2 22 22 - 32 mmol/L   Glucose, Bld 138 (H) 70 - 99 mg/dL    Comment: Glucose reference range  applies only to samples taken after fasting for at least 8 hours.   BUN 20 8 - 23 mg/dL   Creatinine, Ser 2.13 (H) 0.44 - 1.00 mg/dL   Calcium  8.4 (L) 8.9 - 10.3 mg/dL   GFR, Estimated 18 (L) >60 mL/min    Comment: (NOTE) Calculated using the CKD-EPI Creatinine Equation (2021)    Anion gap 13 5 - 15    Comment: Performed at [facility], [address] [place], [place] [address]  Glucose, capillary     Status: Abnormal   Collection Time: 09/25/23 11:57 PM  Result Value Ref Range   Glucose-Capillary 125 (H) 70 - 99 mg/dL    Comment: Glucose reference range applies only to samples taken after fasting for at least 8 hours.  Glucose, capillary     Status: Abnormal   Collection Time: 09/26/23 12:55 AM  Result Value Ref Range   Glucose-Capillary 132 (H) 70 - 99 mg/dL    Comment: Glucose reference range applies only to samples taken after fasting for at least 8 hours.  Glucose, capillary     Status: Abnormal   Collection Time: 09/26/23  2:53 AM  Result Value Ref Range   Glucose-Capillary 181 (H) 70 - 99 mg/dL    Comment: Glucose reference range applies only to samples taken after fasting for at least 8 hours.  Glucose, capillary     Status: Abnormal   Collection Time: 09/26/23  3:57 AM  Result Value Ref Range   Glucose-Capillary 176 (H) 70 - 99 mg/dL    Comment: Glucose reference range applies only to samples taken after fasting for at least 8 hours.  Glucose, capillary     Status: Abnormal   Collection Time: 09/26/23  4:57 AM  Result Value Ref Range   Glucose-Capillary 139 (H) 70 - 99 mg/dL    Comment: Glucose reference range applies only to samples taken after fasting for at least 8 hours.  Glucose, capillary     Status: Abnormal   Collection Time: 09/26/23  5:59 AM  Result Value Ref Range   Glucose-Capillary 114 (H)  70 - 99 mg/dL    Comment: Glucose reference range applies only to samples taken after fasting for at least 8 hours.  Basic metabolic panel     Status: Abnormal   Collection Time: 09/26/23  6:22 AM  Result Value Ref Range   Sodium 138 135 - 145 mmol/L   Potassium 5.7 (H) 3.5 - 5.1 mmol/L    Comment: HEMOLYSIS AT THIS LEVEL MAY AFFECT RESULT   Chloride 106 98 - 111 mmol/L   CO2 21 (L) 22 - 32 mmol/L   Glucose, Bld 132 (H) 70 - 99 mg/dL    Comment: Glucose reference range applies only to samples taken after fasting for at least 8 hours.   BUN 21 8 - 23 mg/dL   Creatinine, Ser 8.46 (H) 0.44 - 1.00 mg/dL   Calcium  8.2 (L) 8.9 - 10.3 mg/dL   GFR, Estimated 17 (L) >60 mL/min    Comment: (NOTE) Calculated using the CKD-EPI Creatinine Equation (2021)    Anion gap 11 5 - 15    Comment: Performed at [facility], [address] [place], [place] [address]  Triglycerides     Status: None   Collection Time: 09/26/23  6:22 AM  Result Value Ref Range   Triglycerides 66 <150 mg/dL    Comment: Performed at [facility], [address], [place], [place] [address]  Glucose,  capillary     Status: Abnormal   Collection Time: 09/26/23  6:46 AM  Result Value Ref Range   Glucose-Capillary 154 (H) 70 - 99 mg/dL    Comment: Glucose reference range applies only to samples taken after fasting for at least 8 hours.   VAS US  UPPER EXTREMITY VENOUS DUPLEX Result Date: 09/25/2023 UPPER VENOUS STUDY  Patient Name:  Lori Gates  Date of Exam:   09/25/2023 Medical Rec #: [id number]         Accession #:    [id number] Date of Birth: 1955-08-24        Patient Gender: F Patient Age:   80 years Exam Location:  [facility] Procedure:      VAS US  UPPER EXTREMITY VENOUS DUPLEX Referring Phys: Autry Legions PAYNE --------------------------------------------------------------------------------  Indications: Swelling Risk Factors: Immobility past pregnancy. Limitations: Line. Comparison Study: None. Performing  Technologist: Estanislao Heimlich  Examination Guidelines: A complete evaluation includes B-mode imaging, spectral Doppler, color Doppler, and power Doppler as needed of all accessible portions of each vessel. Bilateral testing is considered an integral part of a complete examination. Limited examinations for reoccurring indications may be performed as noted.  Right Findings: +----------+------------+---------+-----------+----------+-------+ RIGHT     CompressiblePhasicitySpontaneousPropertiesSummary +----------+------------+---------+-----------+----------+-------+ Subclavian    Full       Yes       Yes                      +----------+------------+---------+-----------+----------+-------+  Left Findings: +----------+------------+---------+-----------+------------------+----------+ LEFT      CompressiblePhasicitySpontaneous    Properties     Summary   +----------+------------+---------+-----------+------------------+----------+ IJV                                                         Obstructed +----------+------------+---------+-----------+------------------+----------+ Subclavian    Full       Yes       Yes                                 +----------+------------+---------+-----------+------------------+----------+ Axillary      Full       Yes       Yes                                 +----------+------------+---------+-----------+------------------+----------+ Brachial      Full       Yes       Yes                                 +----------+------------+---------+-----------+------------------+----------+ Radial        Full       No        Yes                                 +----------+------------+---------+-----------+------------------+----------+ Ulnar         Full       No        Yes                                 +----------+------------+---------+-----------+------------------+----------+  Basilic       None       No        No     brightly  echogenic  Acute    +----------+------------+---------+-----------+------------------+----------+ Left Brachial-Cephalic AVF is patent  Summary:  Right: No evidence of thrombosis in the subclavian.  Left: No evidence of deep vein thrombosis in the upper extremity. Findings consistent with acute superficial vein thrombosis involving the left basilic vein.  *See table(s) above for measurements and observations.  Diagnosing physician: Genny Kid MD Electronically signed by Genny Kid MD on 09/25/2023 at 8:31:48 PM.    Final    EEG adult Result Date: 09/25/2023 Arleene Lack, MD     09/25/2023 12:43 PM Patient Name: ZULI MEALOR MRN: [id number] Epilepsy Attending: Arleene Lack Referring Physician/Provider: Kimberley Penman, MD Date: 09/25/2023 Duration: 23.47 mins Patient history: 68 year old female transferred from [facility] as a telestroke but with more concern for seizure in the setting of severe hyperglycemia then stroke. Level of alertness:  comatose AEDs during EEG study: LEV, propofol  Technical aspects: This EEG study was done with scalp electrodes positioned according to the 10-20 International system of electrode placement. Electrical activity was reviewed with band pass filter of 1-70Hz , sensitivity of 7 uV/mm, display speed of 20mm/sec with a 60Hz  notched filter applied as appropriate. EEG data were recorded continuously and digitally stored.  Video monitoring was available and reviewed as appropriate. Description: EEG showed continuous generalized 3 to 5 Hz theta-delta slowing. Hyperventilation and photic stimulation were not performed.   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 No seizures or epileptiform discharges were seen throughout the recording. Arleene Lack   DG Chest Port 1 View Result Date: 09/25/2023 CLINICAL DATA:  Respiratory failure EXAM: PORTABLE CHEST 1 VIEW COMPARISON:   09/24/2023 FINDINGS: The endotracheal tube is seen 4.7 cm above the carina. Nasogastric tube extends into the upper abdomen beyond the margin of the examination. Progressive right basilar pulmonary infiltrate. Small associated right pleural effusion suspected. Left lung is clear. No pneumothorax. No pleural effusion on left. Cardiac size within normal limits. Pulmonary vascularity is normal. No acute bone abnormality. IMPRESSION: 1. Support tubes in appropriate position. 2. Progressive right basilar pulmonary infiltrate. Small associated right pleural effusion suspected. Electronically Signed   By: Worthy Heads M.D.   On: 09/25/2023 00:36   CT Maxillofacial Wo Contrast Result Date: 09/24/2023 CLINICAL DATA:  Found down, seizure, facial swelling on earlier CT EXAM: CT MAXILLOFACIAL WITHOUT CONTRAST TECHNIQUE: Multidetector CT imaging of the maxillofacial structures was performed. Multiplanar CT image reconstructions were also generated. RADIATION DOSE REDUCTION: This exam was performed according to the departmental dose-optimization program which includes automated exposure control, adjustment of the mA and/or kV according to patient size and/or use of iterative reconstruction technique. COMPARISON:  09/24/2023 FINDINGS: Osseous: No fracture or mandibular dislocation. No destructive process. Orbits: Negative. No traumatic or inflammatory finding. Sinuses: Small gas fluid level left maxillary sinus. Mild mucosal thickening within the sphenoid sinus. Soft tissues: Left periorbital soft tissue swelling. Subcutaneous edema is seen along the left side mandible and extending into the left neck. Limited intracranial: No significant or unexpected finding. IMPRESSION: 1. Left periorbital soft tissue swelling, as well as subcutaneous edema along the left submandibular region and left side neck. 2. No acute facial bone fracture. 3. Minimal left maxillary and sphenoid sinus disease. Electronically Signed   By: Bari Boos.D.  On: 09/24/2023 19:15   CT Cervical Spine Wo Contrast Result Date: 09/24/2023 CLINICAL DATA:  Left periorbital soft tissue swelling, facial trauma, found down, possible seizure EXAM: CT CERVICAL SPINE WITHOUT CONTRAST TECHNIQUE: Multidetector CT imaging of the cervical spine was performed without intravenous contrast. Multiplanar CT image reconstructions were also generated. RADIATION DOSE REDUCTION: This exam was performed according to the departmental dose-optimization program which includes automated exposure control, adjustment of the mA and/or kV according to patient size and/or use of iterative reconstruction technique. COMPARISON:  None Available. FINDINGS: Alignment: Alignment is grossly anatomic. Skull base and vertebrae: No acute fracture. No primary bone lesion or focal pathologic process. Soft tissues and spinal canal: No prevertebral fluid or swelling. No visible canal hematoma. Disc levels: Multilevel spondylosis and facet hypertrophy, most pronounced from C4-5 through C6-7. Circumferential disc bulge identified at C3-4, C4-5, C5-6, and C6-7. Changes are eccentric to the right at C3-4 and C4-5 with right greater than left neural foraminal narrowing. There is mild-to-moderate central canal stenosis at C4-5 and C5-6. Upper chest: Endotracheal and enteric catheters are partially visualized, distal margins excluded by slice selection. Lung apices are clear. Other: Reconstructed images demonstrate no additional findings. IMPRESSION: 1. No acute cervical spine fracture. 2. Extensive cervical spondylosis and facet hypertrophy as above. Electronically Signed   By: Bobbye Burrow M.D.   On: 09/24/2023 19:12   MR BRAIN WO CONTRAST Result Date: 09/24/2023 CLINICAL DATA:  Mental status change EXAM: MRI HEAD WITHOUT CONTRAST TECHNIQUE: Multiplanar, multiecho pulse sequences of the brain and surrounding structures were obtained without intravenous contrast. COMPARISON:  Earlier same day head CT.  FINDINGS: Brain: Punctate focus of mild diffusion signal abnormality and ADC signal abnormality in the subcortical white matter involving the left postcentral gyrus concerning for acute/early subacute infarct. No additional region of infarct noted. No evidence of intracranial hemorrhage. Mild T2/FLAIR signal abnormality in the periventricular and subcortical white matter. Mild parenchymal volume loss. No mass lesion or midline shift. Cerebellum is unremarkable. Normal appearance of midline structures. The basilar cisterns are patent. No extra-axial fluid collections. Ventricles: Prominence of the lateral ventricles suggestive of underlying parenchymal volume loss. Vascular: Skull base flow voids are visualized. Skull and upper cervical spine: Degenerative changes in the visualized upper cervical spine. Disc osteophyte complex at C3-4 indents the ventral thecal sac. Visualized calvarium is unremarkable. Sinuses/Orbits: Orbits are symmetric. Mild mucosal thickening throughout the paranasal sinuses. Additional more pronounced mucosal thickening in the posterior and inferior left maxillary sinus. Other: Right mastoid effusion. Partially visualized left facial soft tissue swelling. 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 Mild chronic microvascular ischemic changes and mild parenchymal volume loss. Paranasal sinus disease most pronounced in the left maxillary sinus. Right mastoid effusion. Partially visualized left facial soft tissue swelling. Electronically Signed   By: Denny Flack M.D.   On: 09/24/2023 17:54   DG Chest Portable 1 View Result Date: 09/24/2023 CLINICAL DATA:  et tube and og placement EXAM: PORTABLE CHEST - 1 VIEW COMPARISON:  September 24, 2023 4:14 p.m. FINDINGS: Endotracheal tube terminates in the mid trachea. Esophagogastric tube courses below the diaphragm terminating in the stomach. Elevation of the right  hemidiaphragm. Streaky right basilar atelectasis. No focal airspace consolidation, pleural effusion, or pneumothorax. No cardiomegaly. Tortuous aorta with aortic atherosclerosis. No acute fracture or destructive lesion. Likely external jugular IV in the left neck. IMPRESSION: 1. Well-positioned endotracheal and esophagogastric tubes, as delineated above. No pneumothorax. 2. No significant interval  change to the lungs. Electronically Signed   By: Rance Burrows M.D.   On: 09/24/2023 17:17   DG Chest Portable 1 View Result Date: 09/24/2023 CLINICAL DATA:  Found down, unresponsive, seizure EXAM: PORTABLE CHEST 1 VIEW COMPARISON:  07/18/2023 FINDINGS: Single frontal view of the chest demonstrates an unremarkable cardiac silhouette. Stable ectasia and atherosclerosis of the thoracic aorta. No acute airspace disease, effusion, or pneumothorax. No acute displaced fractures. IMPRESSION: 1. Stable chest, no acute process. Electronically Signed   By: Bobbye Burrow M.D.   On: 09/24/2023 16:41   CT HEAD CODE STROKE WO CONTRAST Result Date: 09/24/2023 CLINICAL DATA:  Code stroke. Neuro deficit, concern for stroke, found lying on floor this afternoon unresponsive possible seizure. EXAM: CT HEAD WITHOUT CONTRAST TECHNIQUE: Contiguous axial images were obtained from the base of the skull through the vertex without intravenous contrast. RADIATION DOSE REDUCTION: This exam was performed according to the departmental dose-optimization program which includes automated exposure control, adjustment of the mA and/or kV according to patient size and/or use of iterative reconstruction technique. COMPARISON:  None Available. FINDINGS: Brain: No acute intracranial hemorrhage. No CT evidence of acute infarct. No edema, mass effect, or midline shift. The basilar cisterns are patent. Ventricles: The ventricles are normal. Vascular: Atherosclerotic calcifications of the carotid siphons. No hyperdense vessel. Skull: No acute or aggressive  finding. Orbits: Orbits are symmetric. Sinuses: Mucosal thickening and air-fluid level in the left maxillary sinus. Other: Right mastoid effusion. Left periorbital soft tissue swelling and left facial soft tissue swelling over the maxillary sinus and zygomatic arch. ASPECTS [facility] Stroke Program Early CT Score) - Ganglionic level infarction (caudate, lentiform nuclei, internal capsule, insula, M1-M3 cortex): 7 - Supraganglionic infarction (M4-M6 cortex): 3 Total score (0-10 with 10 being normal): 10 IMPRESSION: 1. No CT evidence of acute intracranial abnormality. 2. Left periorbital and left facial soft tissue swelling is partially visualized. Consider CT maxillofacial for further evaluation of facial trauma if clinically indicated. 3. Right mastoid effusion. Recommend correlation with tenderness over the mastoid temporal bone. 4. ASPECTS is 10 These results were called by telephone at the time of interpretation on 09/24/2023 at 4:02 pm to provider Dr. Isaiah Marc, who verbally acknowledged these results. Electronically Signed   By: Denny Flack M.D.   On: 09/24/2023 16:07    PMH:   Past Medical History:  Diagnosis Date   Asthma    Diabetes mellitus without complication (HCC)    Hypertension     PSH:   Past Surgical History:  Procedure Laterality Date   TUBAL LIGATION      Allergies: No Known Allergies  Medications:   Prior to Admission medications   Medication Sig Start Date End Date Taking? Authorizing Provider  albuterol -ipratropium (COMBIVENT) 18-103 MCG/ACT inhaler Inhale 2 puffs into the lungs every 6 (six) hours as needed. For shortness of breath    [provider]  amLODipine  (NORVASC ) 5 MG tablet Take 5 mg by mouth daily.    [provider]  atorvastatin  (LIPITOR ) 80 MG tablet Take 80 mg by mouth daily.    [provider]  ibuprofen (ADVIL,MOTRIN) 200 MG tablet Take 400 mg by mouth every 6 (six) hours as needed. For pain    [provider]   insulin  aspart protamine- aspart (NOVOLOG  MIX 70/30) (70-30) 100 UNIT/ML injection Inject 0.15 mLs (15 Units total) into the skin daily with breakfast. 05/27/21   Angelyn Kennel M, PA-C  insulin  aspart protamine- aspart (NOVOLOG  MIX 70/30) (70-30) 100 UNIT/ML injection Inject 0.15  mLs (15 Units total) into the skin daily with breakfast. 05/28/21   Wynetta Heckle, MD  insulin  lispro (HUMALOG) 100 UNIT/ML KwikPen Inject 13 Units into the skin 3 (three) times daily. 07/14/23   [provider]  LANTUS  SOLOSTAR 100 UNIT/ML Solostar Pen Inject 20 Units into the skin at bedtime.    [provider]  nebivolol (BYSTOLIC) 2.5 MG tablet Take 2.5 mg by mouth daily. 07/14/23   [provider]  ondansetron  (ZOFRAN ) 4 MG tablet Take 1 tablet (4 mg total) by mouth every 8 (eight) hours as needed for nausea. 05/06/12   Pisciotta, Peterson Brandt, PA-C  oseltamivir  (TAMIFLU ) 75 MG capsule Take 1 capsule (75 mg total) by mouth every 12 (twelve) hours. 05/06/12   Pisciotta, Peterson Brandt, PA-C  RYBELSUS 3 MG TABS Take 2 tablets by mouth daily.    [provider]  thiamine  (VITAMIN B-1) 100 MG tablet Take 100 mg by mouth daily.    [provider]  torsemide (DEMADEX) 20 MG tablet Take 20 mg by mouth daily.    [provider]    Discontinued Meds:   Medications Discontinued During This Encounter  Medication Reason   insulin  glargine (LANTUS ) 100 UNIT/ML injection Dose change   amLODipine  (NORVASC ) 10 MG tablet Dose change   propofol  (DIPRIVAN ) 10 mg/mL bolus/IV push Returned to ADS   lactated ringers  bolus 1,000 mL    levETIRAcetam  (KEPPRA ) 1000 MG/100ML IVPB    levETIRAcetam  (KEPPRA ) IVPB 1000 mg/100 mL premix    propofol  (DIPRIVAN ) 1000 MG/100ML infusion    famotidine  (PEPCID ) tablet 20 mg Duplicate   Chlorhexidine  Gluconate Cloth 2 % PADS 6 each    famotidine  (PEPCID ) tablet 20 mg    potassium chloride  SA (KLOR-CON  M) CR tablet 40 mEq    propofol  (DIPRIVAN ) 1000 MG/100ML  infusion     Social History:  reports that she has quit smoking. Her smoking use included cigarettes. She does not have any smokeless tobacco history on file. She reports that she does not drink alcohol. No history on file for drug use.  Family History:  History reviewed. No pertinent family history.  Blood pressure 121/67, pulse 84, temperature 99.9 F (37.7 C), resp. rate 19, height 5\' 5"  (1.651 m), weight 63.8 kg, SpO2 100%. Physical Exam: Gen on vent No rash, cyanosis or gangrene Sclera anicteric, throat w/ ETT No jvd or bruits Chest clear anterior/ lateral RRR no MRG Abd soft ntnd no mass or ascites +bs GU normal MS no joint effusions or deformity Ext diffuse 1-2+ bilat LE and LUE edema Neuro is on vent but moving extremities  LUA BCF w/ + bruit but ipsilateral swelling     Kashira Behunin, Alveda Aures, MD 09/26/2023, 7:53 AM

## 2023-09-26 NOTE — Progress Notes (Signed)
 Initial Nutrition Assessment  DOCUMENTATION CODES:   Severe malnutrition in context of chronic illness  INTERVENTION:   Initiate tube feeding via OG tube: Osmolite 1.5 at 20 ml/h and increase by 10 ml every 8 hours to goal rate of 40 ml/hr (960 ml per day)  Prosource TF20 60 ml BID  Provides 1600 kcal, 100 gm protein, 729 ml free water daily  Monitor magnesium  and phosphorus every daily 4 occurrences, MD to replete as needed, as pt is at risk for refeeding syndrome given pt meets criteria for severe malnutrition.   NUTRITION DIAGNOSIS:   Severe Malnutrition related to chronic illness (uncontrolled DM, substance abuse) as evidenced by severe fat depletion, severe muscle depletion.  GOAL:   Patient will meet greater than or equal to 90% of their needs  MONITOR:   Vent status, TF tolerance  REASON FOR ASSESSMENT:   Consult Enteral/tube feeding initiation and management  ASSESSMENT:   Pt with PMH of CKD stage IV, uncontrolled IDDM, HTN, HLD, smoking, cocaine abuse (reports stopped March 2024), CHF admitted from home for seizures. UDS positive for cocaine and benzos.   Pt discussed during ICU rounds and with RN and MD.  Pt intubated, no family in the room. Pt unable to answer any questions.   4/29 - admit from home with seizures   Medications reviewed and include: colace BID, pepcid , miralax , lokelma  BID until 5/2, thiamine   Insulin  drip   Labs reviewed:  K 5.7 A1C 14.3 CBG's: 114-179  UOP: 75 ml  + 3145 ml x 24 hours + 6 L since admission   18 F OG tube; per xray tube in appropriate position   NUTRITION - FOCUSED PHYSICAL EXAM:  Flowsheet Row Most Recent Value  Orbital Region Severe depletion  Upper Arm Region Unable to assess  Thoracic and Lumbar Region Severe depletion  Buccal Region Unable to assess  Temple Region Severe depletion  Clavicle Bone Region Severe depletion  Clavicle and Acromion Bone Region Severe depletion  Scapular Bone Region Unable  to assess  Dorsal Hand Unable to assess  Patellar Region Severe depletion  Anterior Thigh Region Severe depletion  Posterior Calf Region Severe depletion  Edema (RD Assessment) None  Hair Reviewed  Eyes Unable to assess  Mouth Unable to assess  Skin Reviewed  Nails Unable to assess       Diet Order:   Diet Order             Diet NPO time specified  Diet effective now                   EDUCATION NEEDS:   Not appropriate for education at this time  Skin:  Skin Assessment: Reviewed RN Assessment  Last BM:  4/30 medium; type 7  Height:   Ht Readings from Last 1 Encounters:  09/25/23 5\' 5"  (1.651 m)    Weight:   Wt Readings from Last 1 Encounters:  09/26/23 63.8 kg    Ideal Body Weight:     BMI:  Body mass index is 23.41 kg/m.  Estimated Nutritional Needs:   Kcal:  1600-1800  Protein:  90-100 grams  Fluid:  1.2 L/day  Randine Butcher., RD, LDN, CNSC See AMiON for contact information

## 2023-09-26 NOTE — Care Management (Signed)
 Transition of Care [facility]) - Inpatient Brief Assessment   Patient Details  Name: Lori Gates MRN: [id number] Date of Birth: 09-07-1955  [facility] of [facility]) CM/SW Contact:    Ronni Colace, RN Phone Number: 09/26/2023, 1:24 PM   Clinical Narrative:   68 year old patent from [place], [place] presented with unresponsive and hyperglycemia She ha sa history or CKD stage V and being followed by [name]  Will likely be starting dialysis. , a temp catheter was placed today. She does have a PCP near her home.  Messaged with [facility] CSW to let her know of pending dialysis.   Transition of Care Asessment: Insurance and Status: Insurance coverage has been reviewed Patient has primary care physician: Yes   Prior level of function:: Independent, but fell Prior/Current Home Services: No current home services Social Drivers of Health Review:  (Questions not answered) Readmission risk has been reviewed: Yes Transition of care needs: transition of care needs identified, TOC will continue to follow

## 2023-09-26 NOTE — Progress Notes (Signed)
 NEUROLOGY CONSULT FOLLOW UP NOTE   Date of service: Sep 26, 2023 Patient Name: Lori Gates MRN:  [id number] DOB:  [birth year]  Interval Hx/subjective  CBG now 154. ICU is here to place a dialysis catheter.   Vitals   Vitals:   09/26/23 0600 09/26/23 0700 09/26/23 0712 09/26/23 0800  BP:  121/67  123/80  Pulse: 80  84   Resp: (!) 21 (!) 24 19 10   Temp: 100 F (37.8 C) 99.9 F (37.7 C) 99.9 F (37.7 C) 100 F (37.8 C)  TempSrc:    Bladder  SpO2: 100%  100% 100%  Weight:      Height:         Body mass index is 23.41 kg/m.  Physical Exam   Constitutional: Exie Holler, elderly female. Eyes: No scleral injection.  HENT: Intubated Head: Normocephalic.   Neurologic Examination  Ment: Off sedation at the time of exam. Did give a thumbs up to command from staff earlier this AM, but not following Neurologist's commands. Furrows brow and turns head away from bright light stimuli.   CN: Pupils 2 mm and sluggish. Eyelids are partially open. No blink to threat. No roving EOM but will blink eyes spontaneously. Face flaccidly symmetric.  Motor/Sensory: Increased flexor tone to BUE at elbows, with increased extensor tone that was seen yesterday now resolve. Continues to have increased flexor tone to digits of hands bilaterally. Tone is decreased in BLE. No withdrawal to light noxious applied to BUE but does withdraw slightly to light noxious plantar stimulation bilaterally.   Reflexes: 2+ bilateral patellars, 1+ bilateral brachioradialis Cerebellar/Gait: Unable to assess   Medications  Current Facility-Administered Medications:    acetaminophen  (TYLENOL ) tablet 650 mg, 650 mg, Per Tube, Q4H PRN, Payne, John D, PA-C   atorvastatin  (LIPITOR ) tablet 80 mg, 80 mg, Per Tube, Daily, Payne, John D, PA-C, 80 mg at 09/25/23 1478   cefTRIAXone  (ROCEPHIN ) 2 g in sodium chloride  0.9 % 100 mL IVPB, 2 g, Intravenous, Q24H, Payne, John D, PA-C, Stopped at 09/25/23 1802   Chlorhexidine   Gluconate Cloth 2 % PADS 6 each, 6 each, Topical, Daily, Desai, Nikita S, MD, 6 each at 09/26/23 0225   Chlorhexidine  Gluconate Cloth 2 % PADS 6 each, 6 each, Topical, Q0600, Patrick Boor, MD   dextrose  5 % in lactated ringers  infusion, , Intravenous, Continuous, Desai, Nikita S, MD, Last Rate: 125 mL/hr at 09/26/23 0800, Infusion Verify at 09/26/23 0800   dextrose  50 % solution 0-50 mL, 0-50 mL, Intravenous, PRN, Mordecai Applebaum, MD   docusate (COLACE) 50 MG/5ML liquid 100 mg, 100 mg, Per Tube, BID, Payne, John D, PA-C   docusate (COLACE) 50 MG/5ML liquid 100 mg, 100 mg, Per Tube, BID PRN, Payne, John D, PA-C, 100 mg at 09/25/23 2956   famotidine  (PEPCID ) tablet 10 mg, 10 mg, Per Tube, Daily, Synthia Ewing, RPH, 10 mg at 09/25/23 2130   fentaNYL  (SUBLIMAZE ) bolus via infusion 25-100 mcg, 25-100 mcg, Intravenous, Q15 min PRN, Marlys Singh, John D, PA-C   fentaNYL  (SUBLIMAZE ) injection 25 mcg, 25 mcg, Intravenous, Once, Marlys Singh, John D, PA-C   fentaNYL  in NS (33mcg/ml) infusion-PREMIX, 25-200 mcg/hr, Intravenous, Continuous, Casimiro Cleaves, PA-C, Stopped at 09/26/23 0557   heparin  injection 5,000 Units, 5,000 Units, Subcutaneous, Q8H, Payne, John D, PA-C, 5,000 Units at 09/26/23 0600   insulin  regular, human (MYXREDLIN ) 100 units/ 100 mL infusion, , Intravenous, Continuous, Scheving, Dozier Genre, MD, Last Rate: 0.3 mL/hr at 09/26/23 0800, 0.3 Units/hr  at 09/26/23 0800   ipratropium-albuterol  (DUONEB) 0.5-2.5 (3) MG/3ML nebulizer solution 3 mL, 3 mL, Nebulization, Q4H PRN, Casimiro Cleaves, PA-C   levETIRAcetam  (KEPPRA ) IVPB 500 mg/100 mL premix, 500 mg, Intravenous, Q12H, Desai, Nikita S, MD, Stopped at 09/25/23 2217   norepinephrine  (LEVOPHED ) 4mg  in (0.016 mg/mL) premix infusion, 2-10 mcg/min, Intravenous, Titrated, Casimiro Cleaves, PA-C   Oral care mouth rinse, 15 mL, Mouth Rinse, Q2H, Aleck Hurdle, MD, 15 mL at 09/26/23 0751   Oral care mouth rinse, 15 mL, Mouth Rinse, PRN, Desai,  Nikita S, MD   polyethylene glycol (MIRALAX  / GLYCOLAX ) packet 17 g, 17 g, Per Tube, Daily, Marlys Singh, John D, PA-C   polyethylene glycol (MIRALAX  / GLYCOLAX ) packet 17 g, 17 g, Per Tube, Daily PRN, Casimiro Cleaves, PA-C, 17 g at 09/25/23 0981   thiamine  (VITAMIN B1) tablet 100 mg, 100 mg, Per Tube, Daily, Casimiro Cleaves, PA-C, 100 mg at 09/25/23 1914  Labs and Diagnostic Imaging   CBC:  Recent Labs  Lab 09/24/23 1534 09/25/23 0218 09/25/23 0447 09/25/23 0955  WBC 13.0*  --  8.6 6.2  NEUTROABS 11.4*  --  6.3  --   HGB 12.0   < > 12.7 11.3*  HCT 38.7   < > 38.2 32.8*  MCV 100.0  --  94.3 93.2  PLT 277  --  235 138*   < > = values in this interval not displayed.    Basic Metabolic Panel:  Lab Results  Component Value Date   NA 138 09/26/2023   K 5.7 (H) 09/26/2023   CO2 21 (L) 09/26/2023   GLUCOSE 132 (H) 09/26/2023   BUN 21 09/26/2023   CREATININE 2.90 (H) 09/26/2023   CALCIUM  8.2 (L) 09/26/2023   GFRNONAA 17 (L) 09/26/2023   GFRAA 72 (L) 05/06/2012   Lipid Panel: No results found for: "LDLCALC" HgbA1c:  Lab Results  Component Value Date   HGBA1C 14.3 (H) 09/25/2023   Urine Drug Screen:     Component Value Date/Time   LABOPIA NONE DETECTED 09/24/2023 1735   COCAINSCRNUR POSITIVE (A) 09/24/2023 1735   LABBENZ POSITIVE (A) 09/24/2023 1735   AMPHETMU NONE DETECTED 09/24/2023 1735   THCU NONE DETECTED 09/24/2023 1735   LABBARB NONE DETECTED 09/24/2023 1735    Alcohol Level     Component Value Date/Time   ETH <15 09/24/2023 1535   INR  Lab Results  Component Value Date   INR 1.0 09/24/2023     Assessment  68 year old female transferred from [facility] as a telestroke but with more concern for seizure in the setting of severe hyperglycemia. PMHx is significant for CKD stage IV, pending fistula placement for plan to start dialysis, uncontrolled diabetes, hypertension, hyperlipidemia, smoking, cocaine abuse and congestive heart failure. Per EMS report  last seen normal at 9:30 in the morning on 4/29.  Then found on the floor seizing at 3 PM with glucose reading "high".  Continues on insulin  drip which has been gradually tapered down, and blood glucose most recently this AM was at 154. Off sedation this AM.  - Exam this AM shows some improvement from yesterday (see above).  - MRI official report with incidental punctate infarct-we do not think it needs further chasing as it is likely either an incidental stroke finding due to small vessel disease or could also be secondary to seizure emanating from the left hemisphere since she had right gaze deviation on presentation. - Toxicology screen was positive  for cocaine - EEG report from yesterday: EEG showed continuous generalized 3 to 5 Hz theta-delta slowing. The study was suggestive of moderate to severe diffuse encephalopathy. No seizures or epileptiform discharges were seen throughout the recording. - CrCl is 16.7, therefore it may take longer to clear sedating meds from her system   - Impression: New onset seizure secondary to severe hyperglycemia and cocaine use  Recommendations  - Continue Keppra  500 BID for now. May not need for longer term given that her seizure was most likely provoked by her severe hyperglycemia with cocaine use also lowering the seizure threshold.  - Inpatient seizure precautions.   35 minutes spent in the neurological evaluation and management of this critically ill patient.   ______________________________________________________________________   Hope Ly, Freddy Spadafora, MD [facility]

## 2023-09-26 NOTE — Progress Notes (Signed)
 NAME:  Lori Gates, MRN:  [id number], DOB:  [birth year], LOS: 2 ADMISSION DATE:  09/24/2023, CONSULTATION DATE:  4/29 REFERRING MD:  Dr. Lincoln Renshaw, CHIEF COMPLAINT:  seizures   History of Present Illness:  68 year old female w/ poorly controlled  IDDM, CKD stage IV, HTN, LLD, HFpEF, substance abuse (cocaine) followed by nephrology in [place].  Hx from ED: Daughter found on floor around 1500 actively seizing, CBG read "high". Continued to seize from time of EMS dispatch to arrival to ER. Pt had received 2.5 mg versed  in route. On arrival had right facial twitching, right gaze pref. Received 2 mg IV ativan . After this gaze pref returned to midline w/ snoring respirations, no further twitching, would grimace to noxious stim.  CT head negative for acute bleed, there was left periorbital facial swelling.  MRI neg for acute process per neuro.  Intubated by EDP for airway protection  Given IV keppra  (total 2 gms)  Initial lab wk: Na 129, glucose 989, anion gap 16, wbc 13, CK 121, trop 114, VBG Ph 7.08, pco2 81, bicarb 24, lactate 2.8-->2.9 Post intubation abg: 7.42/29/130/19.2 PCXR ett good position. 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 Down time not clear CBG "high" seized from EMS notification to arrival to ED> CT and MRI brain neg for acute bleed. Loaded 2 gm keppra . Intubated airway protection   Interim History /  Subjective:  No overnight issues. Minimal uop.  On PS trial.  Objective   Blood pressure 121/67, pulse 84, temperature 99.9 F (37.7 C), resp. rate 19, height 5\' 5"  (1.651 m), weight 63.8 kg, SpO2 100%.    Vent Mode: PSV FiO2 (%):  [40 %] 40 % Set Rate:  [24 bmp] 24 bmp Vt Set:  [450 mL] 450 mL PEEP:  [5 cmH20] 5 cmH20 Pressure Support:  [10 cmH20] 10 cmH20 Plateau Pressure:  [20 cmH20-23 cmH20] 20 cmH20   Intake/Output Summary (Last 24 hours) at 09/26/2023 0803 Last data filed at 09/26/2023 0600 Gross per 24 hour  Intake 3060.1 ml  Output 75 ml  Net 2985.1 ml   Filed Weights   09/24/23 1526 09/25/23 0707 09/26/23 0500  Weight: 60 kg 61.6 kg 63.8 kg    Examination: Gen:      Intubated, sedated, acutely ill appearing HEENT:  ETT to vent Lungs:    sounds of mechanical ventilation auscultated no wheeze CV:         RRR Abd:      + bowel sounds; soft, non-tender; no palpable masses, no distension Ext:   Diffuse edema, LUE AVF +thrill and bruit Skin:      Warm and dry; no rashes Neuro:   sedated, RASS -2, moves all 4 extremities, very weak, does not consistently follow commands    Labs and imaging personally reviewed   CBGs>200 Na 137 K 3.4 CO2 18, AG 16 Cr 2.66 lactic acid 6.6 WBC 8.6 Hgb 12.7 BHB 0.83 Ammonia 28  Resolved Hospital Problem list     Assessment & Plan:  Acute metabolic encephalopathy secondary to: Seizure with post-ictal state UDS positive Cocaine and Benzos DKA Plan: -Neuro following; appreciate recs. EEG negative for ongoing seizures.  - currently off sedation -limit sedating meds -Continue neuroprotective measures- normothermia, euglycemia, HOB greater than 30, head in neutral alignment, normocapnia, normoxia  Acute hypercarbic resp failure s/p seizure.  Does have some right basilar vol loss. Chronically elevated Right HD Plan: - on PS trial this morning but mental status precludes extubation -VAP bundle -PAD protocol   DKA with coma Hx  of IDDM Plan: -Insulin  per endotool -CBG monitoring - follow BMET - starting tube feeds today, will wait to transition off endotool until then  AKI on CKD 4 Hyponatremia Hyperkalemia Plan: - LUE AVF placed in 2024. Still making urine. - consult to nephrology.has not yet started HD as outpatient but today is oliguric, hyperkalemic, volume overloaded. Will place temp catheter to start HD.  -iv fluids -Trend BMP / urinary output -Replace electrolytes as indicated -Avoid nephrotoxic agents, ensure adequate renal perfusion  UTI Sepsis Plan: -rocephin  -follow cultures -iv fluids -trend LA  Elevated troponin Plan: -likely demand -trend  LUE superficial venous thrombosis Plan: - warm compresses, symptoms management  HTN HLD HFpEF Plan: -hold home anti-htn meds; avoid BB w/ hx of cocaine use -statin - volume management per HD   Best Practice (right click and "Reselect all SmartList Selections" daily)   Diet/type: NPO w/ meds via tube DVT prophylaxis prophylactic heparin   Pressure ulcer(s): N/A GI prophylaxis: H2B Lines: N/A Foley:  Yes, and it is still needed Code Status:  full code Last date of multidisciplinary goals of care discussion [4/30 updated grandson over phone. States full code. Will update family at bedside today.]    Critical care time:     The patient is critically ill due to respiratory failure, encephalopathy.  Critical care was necessary to treat or prevent imminent or life-threatening deterioration.  Critical care was time spent personally by me on the following activities: development of treatment plan with patient and/or surrogate as well as nursing, discussions with consultants, evaluation of patient's response to treatment, examination of patient, obtaining history from patient or surrogate, ordering and performing treatments and interventions, ordering and review of laboratory studies, ordering and review of radiographic studies, pulse  oximetry, re-evaluation of patient's condition and participation in multidisciplinary rounds.   Critical Care Time devoted to patient care services described in this note is 35 minutes. This time reflects time of care of this signee Krithi Bray S Harshal Sirmon . This critical care time does not reflect separately billable procedures or procedure time, teaching time or supervisory time of PA/NP/Med student/Med Resident etc but could involve care discussion time.       Aleck Hurdle [facility] 09/26/2023 8:03 AM  Pager: see AMION  If no response to pager , please call critical care on call (see AMION) until 7pm After 7:00 pm call Elink

## 2023-09-27 ENCOUNTER — Inpatient Hospital Stay (HOSPITAL_COMMUNITY)

## 2023-09-27 DIAGNOSIS — J9601 Acute respiratory failure with hypoxia: Secondary | ICD-10-CM

## 2023-09-27 DIAGNOSIS — R569 Unspecified convulsions: Secondary | ICD-10-CM | POA: Diagnosis not present

## 2023-09-27 DIAGNOSIS — E1111 Type 2 diabetes mellitus with ketoacidosis with coma: Secondary | ICD-10-CM | POA: Diagnosis not present

## 2023-09-27 DIAGNOSIS — E871 Hypo-osmolality and hyponatremia: Secondary | ICD-10-CM

## 2023-09-27 DIAGNOSIS — G928 Other toxic encephalopathy: Secondary | ICD-10-CM | POA: Diagnosis not present

## 2023-09-27 DIAGNOSIS — I5031 Acute diastolic (congestive) heart failure: Secondary | ICD-10-CM

## 2023-09-27 DIAGNOSIS — E43 Unspecified severe protein-calorie malnutrition: Secondary | ICD-10-CM

## 2023-09-27 LAB — ECHOCARDIOGRAM COMPLETE
AR max vel: 3.75 cm2
AV Area VTI: 4.76 cm2
AV Area mean vel: 3.7 cm2
AV Mean grad: 2 mmHg
AV Peak grad: 3.5 mmHg
Ao pk vel: 0.94 m/s
Area-P 1/2: 4.63 cm2
Calc EF: 65.9 %
Height: 65 in
MV VTI: 4.52 cm2
S' Lateral: 2 cm
Single Plane A2C EF: 63.1 %
Single Plane A4C EF: 65.5 %
Weight: 2257.51 [oz_av]

## 2023-09-27 LAB — CBC
HCT: 26.5 % — ABNORMAL LOW (ref 36.0–46.0)
Hemoglobin: 8.8 g/dL — ABNORMAL LOW (ref 12.0–15.0)
MCH: 31.7 pg (ref 26.0–34.0)
MCHC: 33.2 g/dL (ref 30.0–36.0)
MCV: 95.3 fL (ref 80.0–100.0)
Platelets: 160 10*3/uL (ref 150–400)
RBC: 2.78 MIL/uL — ABNORMAL LOW (ref 3.87–5.11)
RDW: 12.9 % (ref 11.5–15.5)
WBC: 10.9 10*3/uL — ABNORMAL HIGH (ref 4.0–10.5)
nRBC: 0 % (ref 0.0–0.2)

## 2023-09-27 LAB — CULTURE, RESPIRATORY W GRAM STAIN: Culture: NORMAL

## 2023-09-27 LAB — BASIC METABOLIC PANEL WITH GFR
Anion gap: 10 (ref 5–15)
BUN: 29 mg/dL — ABNORMAL HIGH (ref 8–23)
CO2: 24 mmol/L (ref 22–32)
Calcium: 7.9 mg/dL — ABNORMAL LOW (ref 8.9–10.3)
Chloride: 106 mmol/L (ref 98–111)
Creatinine, Ser: 2.89 mg/dL — ABNORMAL HIGH (ref 0.44–1.00)
GFR, Estimated: 17 mL/min — ABNORMAL LOW (ref >60)
Glucose, Bld: 173 mg/dL — ABNORMAL HIGH (ref 70–99)
Potassium: 4.1 mmol/L (ref 3.5–5.1)
Sodium: 140 mmol/L (ref 135–145)

## 2023-09-27 LAB — GLUCOSE, CAPILLARY
Glucose-Capillary: 181 mg/dL — ABNORMAL HIGH (ref 70–99)
Glucose-Capillary: 142 mg/dL — ABNORMAL HIGH (ref 70–99)
Glucose-Capillary: 144 mg/dL — ABNORMAL HIGH (ref 70–99)
Glucose-Capillary: 144 mg/dL — ABNORMAL HIGH (ref 70–99)

## 2023-09-27 LAB — HEPATITIS B SURFACE ANTIBODY, QUANTITATIVE: Hep B S AB Quant (Post): <3.5 m[IU]/mL — ABNORMAL LOW

## 2023-09-27 LAB — MAGNESIUM: Magnesium: 1.8 mg/dL (ref 1.7–2.4)

## 2023-09-27 LAB — PHOSPHORUS: Phosphorus: 2.3 mg/dL — ABNORMAL LOW (ref 2.5–4.6)

## 2023-09-27 MED ORDER — AMLODIPINE BESYLATE 10 MG PO TABS
10.0000 mg | ORAL_TABLET | Freq: Every day | ORAL | Status: DC
  Administered 2023-09-27 – 2023-09-29 (×3): 10 mg
  Filled 2023-09-27 (×3): qty 1

## 2023-09-27 MED ORDER — DEXMEDETOMIDINE HCL IN NACL 400 MCG/100ML IV SOLN
0.0000 ug/kg/h | INTRAVENOUS | Status: DC
Start: 2023-09-27 — End: 2023-09-28
  Administered 2023-09-27: 0.4 ug/kg/h via INTRAVENOUS
  Administered 2023-09-27 – 2023-09-28 (×2): 1.2 ug/kg/h via INTRAVENOUS
  Filled 2023-09-27 (×4): qty 100

## 2023-09-27 MED ORDER — HYDRALAZINE HCL 20 MG/ML IJ SOLN
10.0000 mg | INTRAMUSCULAR | Status: DC | PRN
  Administered 2023-09-27 – 2023-10-04 (×7): 10 mg via INTRAVENOUS
  Filled 2023-09-27 (×7): qty 1

## 2023-09-27 MED ORDER — MAGNESIUM SULFATE 2 GM/50ML IV SOLN
2.0000 g | Freq: Once | INTRAVENOUS | Status: AC
  Administered 2023-09-27: 2 g via INTRAVENOUS
  Filled 2023-09-27: qty 50

## 2023-09-27 MED ORDER — POTASSIUM & SODIUM PHOSPHATES 280-160-250 MG PO PACK
1.0000 | PACK | Freq: Three times a day (TID) | ORAL | Status: AC
  Administered 2023-09-27 (×3): 1
  Filled 2023-09-27 (×3): qty 1

## 2023-09-27 NOTE — Progress Notes (Signed)
  Echocardiogram 2D Echocardiogram has been performed.  Pearley Millington L Yosiel Thieme RDCS 09/27/2023, 11:29 AM

## 2023-09-27 NOTE — Progress Notes (Addendum)
 NAME:  Lori Gates, MRN:  [id number], DOB:  1955-08-16, LOS: 3 ADMISSION DATE:  09/24/2023, CONSULTATION DATE:  4/29 REFERRING MD:  Dr. Lincoln Renshaw, CHIEF COMPLAINT:  seizures   History of Present Illness:  68 year old female w/ poorly controlled  IDDM, CKD stage IV, HTN, LLD, HFpEF, substance abuse (cocaine) followed by nephrology in [place].  Hx from ED: Daughter found on floor around 1500 actively seizing, CBG read "high". Continued to seize from time of EMS dispatch to arrival to ER. Pt had received 2.5 mg versed  in route. On arrival had right facial twitching, right gaze pref. Received 2 mg IV ativan . After this gaze pref returned to midline w/ snoring respirations, no further twitching, would grimace to noxious stim.  CT head negative for acute bleed, there was left periorbital facial swelling.  MRI neg for acute process per neuro.  Intubated by EDP for airway protection  Given IV keppra  (total 2 gms)  Initial lab wk: Na 129, glucose 989, anion gap 16, wbc 13, CK 121, trop 114, VBG Ph 7.08, pco2 81, bicarb 24, lactate 2.8-->2.9 Post intubation abg: 7.42/29/130/19.2 PCXR ett good position. 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 Down time not clear CBG "high" seized from EMS notification to arrival to ED> CT and MRI brain neg for acute bleed. Loaded 2 gm keppra . Intubated airway protection   Interim History /  Subjective:  No overnight issues Remained afebrile Did not tolerate a spontaneous breathing trial due to apnea this morning  Objective   Blood pressure (!) 141/70, pulse 74, temperature 98.4 F (36.9 C), resp. rate (!) 24, height 5\' 5"  (1.651 m), weight 63.8 kg, SpO2 100%.    Vent Mode: AC;PRVC FiO2 (%):  [30 %] 30 % Set Rate:  [24 bmp] 24 bmp Vt Set:  [450 mL] 450 mL PEEP:  [5 cmH20] 5 cmH20 Plateau Pressure:  [21 cmH20-22 cmH20] 22 cmH20   Intake/Output Summary (Last 24 hours) at 09/27/2023 0746 Last data filed at 09/27/2023 0400 Gross per 24 hour  Intake 2324.91 ml  Output 105 ml  Net 2219.91 ml   Filed Weights   09/24/23 1526 09/25/23 0707 09/26/23 0500  Weight: 60 kg 61.6 kg 63.8 kg    Examination: General: Crtitically ill-appearing elderly female, orally intubated HEENT: [place]/AT, eyes anicteric.  ETT and OGT in place Neuro: Eyes closed, opens with vocal stimuli, not following commands, moving all 4 extremities Chest: Coarse breath sounds, no wheezes or rhonchi Heart: Regular rate and rhythm, no murmurs or gallops.   Abdomen: Soft, nondistended, bowel sounds present Extremities: 3+ pitting edema noted in bilateral lower extremities, left upper extremity AV fistula present Skin: No rash  Labs and images reviewed  Labs and imaging personally reviewed   CBGs>200 Na 137 K 3.4 CO2 18, AG 16 Cr 2.66 lactic acid 6.6 WBC 8.6 Hgb 12.7 BHB 0.83 Ammonia 28  Resolved Hospital Problem list     Assessment &  Plan:  Acute metabolic encephalopathy secondary to DKA Acute toxic encephalopathy in the setting of cocaine abuse Patient is currently on fentanyl  50 mics Will stop sedation after she is done with dialysis Avoid deep sedation Monitor for signs of withdrawal DKA has been corrected  Seizure with post-ictal state Likely in the setting of DKA with blood sugar close to 1000 Continue Keppra  500 mg twice daily Appreciate neurology follow-up  Acute hypoxic/hypercapnic  respiratory failure Right lower lobe pneumonia likely aspiration Continue lung protective ventilation VAP prevention bundle in place PAD protocol with low-dose fentanyl  which will be stopped after dialysis Patient failed spontaneous breathing trial due to apnea this morning Will try again later Respiratory culture is growing gram-positive cocci, x-ray chest is suggestive of right lower lobe infiltrate Continue IV ceftriaxone  to complete 7 days therapy  DKA with coma  Blood sugars are better controlled Continue long acting and sliding scale insulin  with CBG goal 140-180 Trend lactate  AKI on CKD 4, requiring hemodialysis Hyponatremia, hypophosphatemia Hyperkalemia  LUE AVF placed in 2024 Made minimal urine in last 24-hours Appreciate nephrology follow-up, patient is receiving hemodialysis Closely monitor and supplement electrolytes  Sepsis due to UTI, POA Lactic acidosis Urine culture grew lactobacillus, likely contaminant Continue IV antibiotics with ceftriaxone   Demand cardiac ischemia Echocardiogram is pending EKG showed no ST or T wave changes  LUE superficial venous thrombosis Continue warm compress  HTN HLD HFpEF Holding antihypertensives for now Continue statin Obtain echocardiogram  Severe protein calorie malnutrition Continue dietary supplements  Best Practice (right click and "Reselect all SmartList Selections" daily)   Diet/type: NPO w/ meds via tube DVT prophylaxis prophylactic heparin   Pressure ulcer(s): N/A GI prophylaxis: H2B Lines: N/A Foley:  Yes, and it is still needed Code Status:  full code Last date of multidisciplinary goals of care discussion [4/30 updated grandson over phone. States full code. Will update family at bedside today.]   The patient is critically ill due to acute encephalopathy in the setting of DKA/seizures/acute respiratory failure with hypoxia and hypercapnia.  Critical care was necessary to treat or prevent imminent or  life-threatening deterioration.  Critical care was time spent personally by me on the following activities: development of treatment plan with patient and/or surrogate as well as nursing, discussions with consultants, evaluation of patient's response to treatment, examination of patient, obtaining history from patient or surrogate, ordering and performing treatments and interventions, ordering and review of laboratory studies, ordering and review of radiographic studies, pulse oximetry, re-evaluation of patient's condition and participation in multidisciplinary rounds.   During this encounter critical care time was devoted to patient care services described in this note for 38 minutes.    Trevor Fudge, MD [facility] See Amion for pager If no response to pager, please call [phone] until 7pm After 7pm, Please call E-link [phone]

## 2023-09-27 NOTE — Progress Notes (Signed)
 eLink Physician-Brief Progress Note Patient Name: Lori Gates DOB: Jan 24, 1956 MRN: [id number]   Date of Service  09/27/2023  HPI/Events of Note  Hypertensive to 193/75.  Status post HD today with 0 UF  Home meds include amlodipine , Nebivolol, torsemide  EF preserved, RV function preserved  eICU Interventions  Add amlodipine  scheduled  Hydralazine  as needed     Intervention Category Intermediate Interventions: Hypertension - evaluation and management  Terrelle Ruffolo 09/27/2023, 10:54 PM

## 2023-09-27 NOTE — Progress Notes (Signed)
 CARON CARREON is an 68 y.o. female  w/ IDDM, HTN, CKD4 found down on the floor by her daughter seizing. CGB read "high". EMS gave versed  and ED at [facility] gave Ativan  2mg  IV. CT head neg and MRI head was negative. Pt was intubated for airway protection. Drug screen was + for cocaine and BZD's. Pt was rx'd w/ IV insulin  protocol for HHS vs DKA. Pt was admitted to ICU at [facility]. Initial BS's were > 600, BUN 18, creat 2.73 , eGFR 18 ml/min. Pt had fevers to 101. Pressors have not been needed. UOP is poor w/ 90 cc yesterday and 60 cc today. CXR showing progressive RLL infiltrate. No edema.  Not on dialysis yet but recently put a fistula in her left arm about 1 month ago.  She was living in [place]  but recently moved to [place], [place].  Assessment/Plan:  CKD 4 - b/l creatinine is 2.5- 2.8 from mid-late 2024, eGFR 17- 21 ml/min. Creat here is 2.6- 2.7 in baseline range. AVF was placed in L upper ext per the family, but the patient is not on dialysis. Not sure who follows her at this time, she recently moved from [place] to [place]. D/W daughter Melissa Broadnax about need to start hd and she agreed on 5/1. +8.3L - Appreciate temp catheter by CCM. - Tolerated dialysis early 5/2; plan next HD for Sat.  -Monitor Daily I/Os, Daily weight  -Maintain MAP>65 for optimal renal perfusion.  - Avoid nephrotoxic agents such as IV contrast, NSAIDs, and phosphate containing bowel preps (FLEETS)  Volume: sig bilat LE edema, some UE edema as well. CXR is clear. BP's are soft so will hold off on diuresing for now.  Seizures: per neurology, treat metabolic derangements Drug abuse: cocaine and BZD's were+ on drug screen DKA w/ coma: per CCM UTI/ sepsis: w/ borderline hypotension, on IV rocephin  Acute hypoxic resp failure - on vent LUE AVF: suspected AVF, good bruit, per family was placed/ created about 1 month ago when she was in [place]  Subjective: Intubated for airway protection but not on pressors.    Chemistry and CBC: Creatinine, Ser  Date/Time Value Ref Range Status  09/27/2023 06:00 AM 2.89 (H) 0.44 - 1.00 mg/dL Final  16/02/9603 54:09 AM 2.90 (H) 0.44 - 1.00 mg/dL Final  81/19/1478 29:56 PM 2.81 (H) 0.44 - 1.00 mg/dL Final  21/30/8657 84:69 PM 2.72 (H) 0.44 - 1.00 mg/dL Final  62/95/2841 32:44 PM 2.77 (H) 0.44 - 1.00 mg/dL Final  05/30/7251 66:44 AM 2.73 (H) 0.44 - 1.00 mg/dL Final  03/47/4259 56:38 AM 2.66 (H) 0.44 - 1.00 mg/dL Final  75/64/3329 51:88 PM 2.58 (H) 0.44 - 1.00 mg/dL Final  41/66/0630 16:01 PM 2.64 (H) 0.44 - 1.00 mg/dL Final  09/32/3557 32:20 PM 1.09 (H) 0.44 - 1.00 mg/dL Final  25/42/7062 37:62 PM 0.99 0.50 - 1.10 mg/dL Final   Recent Labs  Lab 09/25/23 0447 09/25/23 0955 09/25/23 1626 09/25/23 1931 09/25/23 2317 09/26/23 0622 09/27/23 0600  NA 137 138 138 139 140 138 140  K 3.4* 3.1* 4.1 4.4 5.0 5.7* 4.1  CL 103 102 104 104 105 106 106  CO2 18* 20* 19* 21* 22 21* 24  GLUCOSE 431* 251* 165* 152* 138* 132* 173*  BUN 18 18 19 19 20 21  29*  CREATININE 2.66* 2.73* 2.77* 2.72* 2.81* 2.90* 2.89*  CALCIUM  8.3* 8.4* 8.4* 8.4* 8.4* 8.2* 7.9*  PHOS  --   --   --   --   --   --  2.3*   Recent Labs  Lab 09/24/23 1534 09/25/23 0218 09/25/23 0447 09/25/23 0955 09/27/23 0600  WBC 13.0*  --  8.6 6.2 10.9*  NEUTROABS 11.4*  --  6.3  --   --   HGB 12.0 8.8* 12.7 11.3* 8.8*  HCT 38.7 26.0* 38.2 32.8* 26.5*  MCV 100.0  --  94.3 93.2 95.3  PLT 277  --  235 138* 160   Liver Function Tests: Recent Labs  Lab 09/24/23 1534  AST 19  ALT 12  ALKPHOS 136*  BILITOT 0.6  PROT 7.6  ALBUMIN 3.6   No results for input(s): "LIPASE", "AMYLASE" in the last 168 hours. Recent Labs  Lab 09/25/23 0447  AMMONIA 28   Cardiac Enzymes: Recent Labs  Lab 09/24/23 1534  CKTOTAL 121   Iron Studies: No results for input(s): "IRON", "TIBC", "TRANSFERRIN", "FERRITIN" in the last 72 hours. PT/INR: @LABRCNTIP (inr:5)  Xrays/Other Studies: ) Results for orders placed  or performed during the hospital encounter of 09/24/23 (from the past 48 hours)  Glucose, capillary     Status: Abnormal   Collection Time: 09/25/23 11:06 AM  Result Value Ref Range   Glucose-Capillary 127 (H) 70 - 99 mg/dL    Comment: Glucose reference range applies only to samples taken after fasting for at least 8 hours.  Glucose, capillary     Status: Abnormal   Collection Time: 09/25/23 12:01 PM  Result Value Ref Range   Glucose-Capillary 138 (H) 70 - 99 mg/dL    Comment: Glucose reference range applies only to samples taken after fasting for at least 8 hours.  Beta-hydroxybutyric acid     Status: Abnormal   Collection Time: 09/25/23 12:15 PM  Result Value Ref Range   Beta-Hydroxybutyric Acid 0.36 (H) 0.05 - 0.27 mmol/L    Comment: Performed at [facility], [address], [place], [place] [address]  Glucose, capillary     Status: Abnormal   Collection Time: 09/25/23 12:58 PM  Result Value Ref Range   Glucose-Capillary 136 (H) 70 - 99 mg/dL    Comment: Glucose reference range applies only to samples taken after fasting for at least 8 hours.  Glucose, capillary     Status: Abnormal   Collection Time: 09/25/23  2:56 PM  Result Value Ref Range   Glucose-Capillary 132 (H) 70 - 99 mg/dL    Comment: Glucose reference range applies only to samples taken after fasting for at least 8 hours.  Basic metabolic panel     Status: Abnormal   Collection Time: 09/25/23  4:26 PM  Result Value Ref Range   Sodium 138 135 - 145 mmol/L   Potassium 4.1 3.5 - 5.1 mmol/L   Chloride 104 98 - 111 mmol/L   CO2 19 (L) 22 - 32 mmol/L   Glucose, Bld 165 (H) 70 - 99 mg/dL    Comment: Glucose reference range applies only to samples taken after fasting for at least 8 hours.   BUN 19 8 - 23 mg/dL   Creatinine, Ser 1.91 (H) 0.44 - 1.00 mg/dL   Calcium  8.4 (L) 8.9 - 10.3 mg/dL   GFR, Estimated 18 (L) >60 mL/min    Comment: (NOTE) Calculated using the CKD-EPI Creatinine Equation (2021)    Anion gap  15 5 - 15    Comment: Performed at [facility], [address] [place], [place] [address]  Beta-hydroxybutyric acid     Status: None   Collection Time: 09/25/23  4:26 PM  Result Value  Ref Range   Beta-Hydroxybutyric Acid 0.19 0.05 - 0.27 mmol/L    Comment: Performed at [facility], [address], [place], [place] [address]  Glucose, capillary     Status: Abnormal   Collection Time: 09/25/23  4:50 PM  Result Value Ref Range   Glucose-Capillary 125 (H) 70 - 99 mg/dL    Comment: Glucose reference range applies only to samples taken after fasting for at least 8 hours.  Glucose, capillary     Status: Abnormal   Collection Time: 09/25/23  6:42 PM  Result Value Ref Range   Glucose-Capillary 121 (H) 70 - 99 mg/dL    Comment: Glucose reference range applies only to samples taken after fasting for at least 8 hours.  Basic metabolic panel     Status: Abnormal   Collection Time: 09/25/23  7:31 PM  Result Value Ref Range   Sodium 139 135 - 145 mmol/L   Potassium 4.4 3.5 - 5.1 mmol/L   Chloride 104 98 - 111 mmol/L   CO2 21 (L) 22 - 32 mmol/L   Glucose, Bld 152 (H) 70 - 99 mg/dL    Comment: Glucose reference range applies only to samples taken after fasting for at least 8 hours.   BUN 19 8 - 23 mg/dL   Creatinine, Ser 1.91 (H) 0.44 - 1.00 mg/dL   Calcium  8.4 (L) 8.9 - 10.3 mg/dL   GFR, Estimated 18 (L) >60 mL/min    Comment: (NOTE) Calculated using the CKD-EPI Creatinine Equation (2021)    Anion gap 14 5 - 15    Comment: Performed at [facility], [address], [place], [place] [address]  Glucose, capillary     Status: Abnormal   Collection Time: 09/25/23  8:11 PM  Result Value Ref Range   Glucose-Capillary 111 (H) 70 - 99 mg/dL    Comment: Glucose reference range applies only to samples taken after fasting for at least 8 hours.  Glucose, capillary     Status: Abnormal   Collection Time: 09/25/23  9:01 PM  Result Value Ref Range   Glucose-Capillary 130 (H) 70 - 99  mg/dL    Comment: Glucose reference range applies only to samples taken after fasting for at least 8 hours.  Glucose, capillary     Status: Abnormal   Collection Time: 09/25/23 10:01 PM  Result Value Ref Range   Glucose-Capillary 109 (H) 70 - 99 mg/dL    Comment: Glucose reference range applies only to samples taken after fasting for at least 8 hours.  Glucose, capillary     Status: Abnormal   Collection Time: 09/25/23 11:03 PM  Result Value Ref Range   Glucose-Capillary 132 (H) 70 - 99 mg/dL    Comment: Glucose reference range applies only to samples taken after fasting for at least 8 hours.  Basic metabolic panel     Status: Abnormal   Collection Time: 09/25/23 11:17 PM  Result Value Ref Range   Sodium 140 135 - 145 mmol/L   Potassium 5.0 3.5 - 5.1 mmol/L   Chloride 105 98 - 111 mmol/L   CO2 22 22 - 32 mmol/L   Glucose, Bld 138 (H) 70 - 99 mg/dL    Comment: Glucose reference range applies only to samples taken after fasting for at least 8 hours.   BUN 20 8 - 23 mg/dL   Creatinine, Ser 5.62 (H) 0.44 - 1.00 mg/dL   Calcium  8.4 (L) 8.9 - 10.3 mg/dL   GFR, Estimated 18 (  L) >60 mL/min    Comment: (NOTE) Calculated using the CKD-EPI Creatinine Equation (2021)    Anion gap 13 5 - 15    Comment: Performed at [facility], [address], [place], [place] [address]  Glucose, capillary     Status: Abnormal   Collection Time: 09/25/23 11:57 PM  Result Value Ref Range   Glucose-Capillary 125 (H) 70 - 99 mg/dL    Comment: Glucose reference range applies only to samples taken after fasting for at least 8 hours.  Glucose, capillary     Status: Abnormal   Collection Time: 09/26/23 12:55 AM  Result Value Ref Range   Glucose-Capillary 132 (H) 70 - 99 mg/dL    Comment: Glucose reference range applies only to samples taken after fasting for at least 8 hours.  Glucose, capillary     Status: Abnormal   Collection Time: 09/26/23  2:53 AM  Result Value Ref Range   Glucose-Capillary 181 (H)  70 - 99 mg/dL    Comment: Glucose reference range applies only to samples taken after fasting for at least 8 hours.  Glucose, capillary     Status: Abnormal   Collection Time: 09/26/23  3:57 AM  Result Value Ref Range   Glucose-Capillary 176 (H) 70 - 99 mg/dL    Comment: Glucose reference range applies only to samples taken after fasting for at least 8 hours.  Glucose, capillary     Status: Abnormal   Collection Time: 09/26/23  4:57 AM  Result Value Ref Range   Glucose-Capillary 139 (H) 70 - 99 mg/dL    Comment: Glucose reference range applies only to samples taken after fasting for at least 8 hours.  Glucose, capillary     Status: Abnormal   Collection Time: 09/26/23  5:59 AM  Result Value Ref Range   Glucose-Capillary 114 (H) 70 - 99 mg/dL    Comment: Glucose reference range applies only to samples taken after fasting for at least 8 hours.  Basic metabolic panel     Status: Abnormal   Collection Time: 09/26/23  6:22 AM  Result Value Ref Range   Sodium 138 135 - 145 mmol/L   Potassium 5.7 (H) 3.5 - 5.1 mmol/L    Comment: HEMOLYSIS AT THIS LEVEL MAY AFFECT RESULT   Chloride 106 98 - 111 mmol/L   CO2 21 (L) 22 - 32 mmol/L   Glucose, Bld 132 (H) 70 - 99 mg/dL    Comment: Glucose reference range applies only to samples taken after fasting for at least 8 hours.   BUN 21 8 - 23 mg/dL   Creatinine, Ser 6.04 (H) 0.44 - 1.00 mg/dL   Calcium  8.2 (L) 8.9 - 10.3 mg/dL   GFR, Estimated 17 (L) >60 mL/min    Comment: (NOTE) Calculated using the CKD-EPI Creatinine Equation (2021)    Anion gap 11 5 - 15    Comment: Performed at [facility], [address] [place], [place] [address]  Triglycerides     Status: None   Collection Time: 09/26/23  6:22 AM  Result Value Ref Range   Triglycerides 66 <150 mg/dL    Comment: Performed at [facility], [address] [place], [place] [address]  Glucose, capillary     Status: Abnormal   Collection Time: 09/26/23  6:46 AM  Result Value  Ref Range   Glucose-Capillary 154 (H) 70 - 99 mg/dL    Comment: Glucose reference range applies only to samples taken after fasting  for at least 8 hours.  Glucose, capillary     Status: Abnormal   Collection Time: 09/26/23  7:48 AM  Result Value Ref Range   Glucose-Capillary 136 (H) 70 - 99 mg/dL    Comment: Glucose reference range applies only to samples taken after fasting for at least 8 hours.  Hepatitis B surface antigen     Status: None   Collection Time: 09/26/23  8:36 AM  Result Value Ref Range   Hepatitis B Surface Ag NON REACTIVE NON REACTIVE    Comment: Performed at [facility], [address] [place], [place] [address]  Glucose, capillary     Status: Abnormal   Collection Time: 09/26/23  9:50 AM  Result Value Ref Range   Glucose-Capillary 167 (H) 70 - 99 mg/dL    Comment: Glucose reference range applies only to samples taken after fasting for at least 8 hours.  Glucose, capillary     Status: Abnormal   Collection Time: 09/26/23 11:56 AM  Result Value Ref Range   Glucose-Capillary 145 (H) 70 - 99 mg/dL    Comment: Glucose reference range applies only to samples taken after fasting for at least 8 hours.  Glucose, capillary     Status: Abnormal   Collection Time: 09/26/23 12:57 PM  Result Value Ref Range   Glucose-Capillary 152 (H) 70 - 99 mg/dL    Comment: Glucose reference range applies only to samples taken after fasting for at least 8 hours.  Glucose, capillary     Status: Abnormal   Collection Time: 09/26/23  2:03 PM  Result Value Ref Range   Glucose-Capillary 165 (H) 70 - 99 mg/dL    Comment: Glucose reference range applies only to samples taken after fasting for at least 8 hours.  Glucose, capillary     Status: Abnormal   Collection Time: 09/26/23  4:13 PM  Result Value Ref Range   Glucose-Capillary 155 (H) 70 - 99 mg/dL    Comment: Glucose reference range applies only to samples taken after fasting for at least 8 hours.  Glucose, capillary     Status:  Abnormal   Collection Time: 09/26/23  6:14 PM  Result Value Ref Range   Glucose-Capillary 128 (H) 70 - 99 mg/dL    Comment: Glucose reference range applies only to samples taken after fasting for at least 8 hours.  Glucose, capillary     Status: Abnormal   Collection Time: 09/26/23  8:03 PM  Result Value Ref Range   Glucose-Capillary 168 (H) 70 - 99 mg/dL    Comment: Glucose reference range applies only to samples taken after fasting for at least 8 hours.  Glucose, capillary     Status: Abnormal   Collection Time: 09/26/23 11:27 PM  Result Value Ref Range   Glucose-Capillary 174 (H) 70 - 99 mg/dL    Comment: Glucose reference range applies only to samples taken after fasting for at least 8 hours.  Glucose, capillary     Status: Abnormal   Collection Time: 09/27/23  3:25 AM  Result Value Ref Range   Glucose-Capillary 181 (H) 70 - 99 mg/dL    Comment: Glucose reference range applies only to samples taken after fasting for at least 8 hours.  Basic metabolic panel     Status: Abnormal   Collection Time: 09/27/23  6:00 AM  Result Value Ref Range   Sodium 140 135 - 145 mmol/L   Potassium 4.1 3.5 - 5.1 mmol/L   Chloride 106 98 - 111  mmol/L   CO2 24 22 - 32 mmol/L   Glucose, Bld 173 (H) 70 - 99 mg/dL    Comment: Glucose reference range applies only to samples taken after fasting for at least 8 hours.   BUN 29 (H) 8 - 23 mg/dL   Creatinine, Ser 4.09 (H) 0.44 - 1.00 mg/dL   Calcium  7.9 (L) 8.9 - 10.3 mg/dL   GFR, Estimated 17 (L) >60 mL/min    Comment: (NOTE) Calculated using the CKD-EPI Creatinine Equation (2021)    Anion gap 10 5 - 15    Comment: Performed at [facility], [address] [place], [place] [address]  CBC     Status: Abnormal   Collection Time: 09/27/23  6:00 AM  Result Value Ref Range   WBC 10.9 (H) 4.0 - 10.5 K/uL   RBC 2.78 (L) 3.87 - 5.11 MIL/uL   Hemoglobin 8.8 (L) 12.0 - 15.0 g/dL    Comment: REPEATED TO VERIFY   HCT 26.5 (L) 36.0 - 46.0 %   MCV 95.3  80.0 - 100.0 fL   MCH 31.7 26.0 - 34.0 pg   MCHC 33.2 30.0 - 36.0 g/dL   RDW 47.8 29.5 - 62.1 %   Platelets 160 150 - 400 K/uL    Comment: CONSISTENT WITH PREVIOUS RESULT REPEATED TO VERIFY    nRBC 0.0 0.0 - 0.2 %    Comment: Performed at [facility], [address], [place], [place] [address]  Magnesium      Status: None   Collection Time: 09/27/23  6:00 AM  Result Value Ref Range   Magnesium  1.8 1.7 - 2.4 mg/dL    Comment: Performed at [facility], [address] [place], [place] [address]  Phosphorus     Status: Abnormal   Collection Time: 09/27/23  6:00 AM  Result Value Ref Range   Phosphorus 2.3 (L) 2.5 - 4.6 mg/dL    Comment: Performed at [facility], [address], [place], [place] [address]  Glucose, capillary     Status: Abnormal   Collection Time: 09/27/23  7:10 AM  Result Value Ref Range   Glucose-Capillary 142 (H) 70 - 99 mg/dL    Comment: Glucose reference range applies only to samples taken after fasting for at least 8 hours.   DG CHEST PORT 1 VIEW Result Date: 09/26/2023 CLINICAL DATA:  Central line placement. EXAM: PORTABLE CHEST 1 VIEW COMPARISON:  September 25, 2023. FINDINGS: Stable cardiomediastinal silhouette. Endotracheal and nasogastric tubes are unchanged. Left lung is clear. Interval placement of right internal jugular catheter with distal tip in expected position of the SVC. Stable right basilar atelectasis or infiltrate is noted. Bony thorax is unremarkable. IMPRESSION: Interval placement of right internal jugular catheter with distal tip in expected position of the SVC. No pneumothorax. Stable right basilar opacity is noted above. Electronically Signed   By: Rosalene Colon M.D.   On: 09/26/2023 10:12   VAS US  UPPER EXTREMITY VENOUS DUPLEX Result Date: 09/25/2023 UPPER VENOUS STUDY  Patient Name:  HANALEI MOCKUS  Date of Exam:   09/25/2023 Medical Rec #: [id number]         Accession #:    [id number] Date of Birth: [birth year]        Patient  Gender: F Patient Age:   89 years Exam Location:  [facility] Procedure:      VAS US  UPPER EXTREMITY VENOUS DUPLEX Referring Phys: Autry Legions PAYNE --------------------------------------------------------------------------------  Indications: Swelling Risk  Factors: Immobility past pregnancy. Limitations: Line. Comparison Study: None. Performing Technologist: Estanislao Heimlich  Examination Guidelines: A complete evaluation includes B-mode imaging, spectral Doppler, color Doppler, and power Doppler as needed of all accessible portions of each vessel. Bilateral testing is considered an integral part of a complete examination. Limited examinations for reoccurring indications may be performed as noted.  Right Findings: +----------+------------+---------+-----------+----------+-------+ RIGHT     CompressiblePhasicitySpontaneousPropertiesSummary +----------+------------+---------+-----------+----------+-------+ Subclavian    Full       Yes       Yes                      +----------+------------+---------+-----------+----------+-------+  Left Findings: +----------+------------+---------+-----------+------------------+----------+ LEFT      CompressiblePhasicitySpontaneous    Properties     Summary   +----------+------------+---------+-----------+------------------+----------+ IJV                                                         Obstructed +----------+------------+---------+-----------+------------------+----------+ Subclavian    Full       Yes       Yes                                 +----------+------------+---------+-----------+------------------+----------+ Axillary      Full       Yes       Yes                                 +----------+------------+---------+-----------+------------------+----------+ Brachial      Full       Yes       Yes                                 +----------+------------+---------+-----------+------------------+----------+ Radial         Full       No        Yes                                 +----------+------------+---------+-----------+------------------+----------+ Ulnar         Full       No        Yes                                 +----------+------------+---------+-----------+------------------+----------+ Basilic       None       No        No     brightly echogenic  Acute    +----------+------------+---------+-----------+------------------+----------+ Left Brachial-Cephalic AVF is patent  Summary:  Right: No evidence of thrombosis in the subclavian.  Left: No evidence of deep vein thrombosis in the upper extremity. Findings consistent with acute superficial vein thrombosis involving the left basilic vein.  *See table(s) above for measurements and observations.  Diagnosing physician: Genny Kid MD Electronically signed by Genny Kid MD on 09/25/2023 at 8:31:48 PM.    Final    EEG adult Result Date: 09/25/2023 Arleene Lack, MD     09/25/2023 12:43 PM Patient Name: MAIRA DOBLE MRN: [id number] Epilepsy  Attending: Arleene Lack Referring Physician/Provider: Kimberley Penman, MD Date: 09/25/2023 Duration: 23.47 mins Patient history: 68 year old female transferred from [facility] as a telestroke but with more concern for seizure in the setting of severe hyperglycemia then stroke. Level of alertness:  comatose AEDs during EEG study: LEV, propofol  Technical aspects: This EEG study was done with scalp electrodes positioned according to the 10-20 International system of electrode placement. Electrical activity was reviewed with band pass filter of 1-70Hz , sensitivity of 7 uV/mm, display speed of 70mm/sec with a 60Hz  notched filter applied as appropriate. EEG data were recorded continuously and digitally stored.  Video monitoring was available and reviewed as appropriate. Description: EEG showed continuous generalized 3 to 5 Hz theta-delta slowing. Hyperventilation and photic stimulation were not  performed.   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 No seizures or epileptiform discharges were seen throughout the recording. Arleene Lack    PMH:   Past Medical History:  Diagnosis Date   Asthma    Diabetes mellitus without complication (HCC)    Hypertension     PSH:   Past Surgical History:  Procedure Laterality Date   TUBAL LIGATION      Allergies: No Known Allergies  Medications:   Prior to Admission medications   Medication Sig Start Date End Date Taking? Authorizing Provider  albuterol -ipratropium (COMBIVENT) 18-103 MCG/ACT inhaler Inhale 2 puffs into the lungs every 6 (six) hours as needed. For shortness of breath    [provider]  amLODipine  (NORVASC ) 5 MG tablet Take 5 mg by mouth daily.    [provider]  atorvastatin  (LIPITOR ) 80 MG tablet Take 80 mg by mouth daily.    [provider]  ibuprofen (ADVIL,MOTRIN) 200 MG tablet Take 400 mg by mouth every 6 (six) hours as needed. For pain    [provider]  insulin  aspart protamine- aspart (NOVOLOG  MIX 70/30) (70-30) 100 UNIT/ML injection Inject 0.15 mLs (15 Units total) into the skin daily with breakfast. 05/27/21   Darletta Ehrich, PA-C  insulin  aspart protamine- aspart (NOVOLOG  MIX 70/30) (70-30) 100 UNIT/ML injection Inject 0.15 mLs (15 Units total) into the skin daily with breakfast. 05/28/21   Wynetta Heckle, MD  insulin  lispro (HUMALOG) 100 UNIT/ML KwikPen Inject 13 Units into the skin 3 (three) times daily. 07/14/23   [provider]  LANTUS  SOLOSTAR 100 UNIT/ML Solostar Pen Inject 20 Units into the skin at bedtime.    [provider]  nebivolol (BYSTOLIC) 2.5 MG tablet Take 2.5 mg by mouth daily. 07/14/23   [provider]  ondansetron  (ZOFRAN ) 4 MG tablet Take 1 tablet (4 mg total) by mouth every 8 (eight) hours as needed for nausea. 05/06/12   Pisciotta, Peterson Brandt,  PA-C  oseltamivir  (TAMIFLU ) 75 MG capsule Take 1 capsule (75 mg total) by mouth every 12 (twelve) hours. 05/06/12   Pisciotta, Peterson Brandt, PA-C  RYBELSUS 3 MG TABS Take 2 tablets by mouth daily.    [provider]  thiamine  (VITAMIN B-1) 100 MG tablet Take 100 mg by mouth daily.    [provider]  torsemide (DEMADEX) 20 MG tablet Take 20 mg by mouth daily.    [provider]    Discontinued Meds:   Medications Discontinued During This Encounter  Medication Reason   insulin  glargine (LANTUS ) 100 UNIT/ML injection Dose change   amLODipine  (NORVASC ) 10 MG tablet Dose change   propofol  (DIPRIVAN ) 10 mg/mL bolus/IV push  Returned to [facility]   lactated ringers  bolus 1,000 mL    levETIRAcetam  (KEPPRA ) 1000 MG/100ML IVPB    levETIRAcetam  (KEPPRA ) IVPB 1000 mg/100 mL premix    propofol  (DIPRIVAN ) 1000 MG/100ML infusion    famotidine  (PEPCID ) tablet 20 mg Duplicate   Chlorhexidine  Gluconate Cloth 2 % PADS 6 each    famotidine  (PEPCID ) tablet 20 mg    potassium chloride  SA (KLOR-CON  M) CR tablet 40 mEq    propofol  (DIPRIVAN ) 1000 MG/100ML infusion    insulin  regular, human (MYXREDLIN ) 100 units/ 100 mL infusion    docusate (COLACE) 50 MG/5ML liquid 100 mg    norepinephrine  (LEVOPHED ) 4mg  in (0.016 mg/mL) premix infusion     Social History:  reports that she has quit smoking. Her smoking use included cigarettes. She does not have any smokeless tobacco history on file. She reports that she does not drink alcohol. No history on file for drug use.  Family History:  History reviewed. No pertinent family history.  Blood pressure (!) 147/75, pulse 76, temperature 97.9 F (36.6 C), resp. rate (!) 24, height 5\' 5"  (1.651 m), weight 64 kg, SpO2 100%. Physical Exam: Gen on vent No rash, cyanosis or gangrene Sclera anicteric, throat w/ ETT No jvd or bruits Chest clear anterior/ lateral RRR no MRG Abd soft ntnd no mass or ascites +bs GU normal MS no joint effusions or  deformity Ext diffuse 1-2+ bilat LE and LUE edema Neuro is on vent but moving extremities  LUA BCF w/ + bruit but ipsilateral swelling, RIJ temp     Saliha Salts, Alveda Aures, MD 09/27/2023, 10:15 AM

## 2023-09-27 NOTE — Progress Notes (Signed)
 Patient in bed on unit. 2N Room 15  Informed consent signed and in chart. Yes  TX duration:3 hours  Patient tolerated well. Yes   No acute distress.  Hand-off given to patient's nurse. TLinnell Richardson RN  Access used: R IJ Access issues: None  Total UF removed: 0 Medication(s) given: None Post HD weight: 64.3 KG Post HD VS: 147/75 ( 96), 24 Resp, 100 on Vent, 73 P   Lori Gates S Desmund Elman RN, DNP [facility]

## 2023-09-28 DIAGNOSIS — J9601 Acute respiratory failure with hypoxia: Secondary | ICD-10-CM | POA: Diagnosis not present

## 2023-09-28 DIAGNOSIS — R569 Unspecified convulsions: Secondary | ICD-10-CM | POA: Diagnosis not present

## 2023-09-28 DIAGNOSIS — E1111 Type 2 diabetes mellitus with ketoacidosis with coma: Secondary | ICD-10-CM | POA: Diagnosis not present

## 2023-09-28 DIAGNOSIS — G928 Other toxic encephalopathy: Secondary | ICD-10-CM | POA: Diagnosis not present

## 2023-09-28 LAB — BASIC METABOLIC PANEL WITH GFR
Anion gap: 9 (ref 5–15)
BUN: 23 mg/dL (ref 8–23)
CO2: 22 mmol/L (ref 22–32)
Calcium: 7.9 mg/dL — ABNORMAL LOW (ref 8.9–10.3)
Chloride: 107 mmol/L (ref 98–111)
Creatinine, Ser: 1.92 mg/dL — ABNORMAL HIGH (ref 0.44–1.00)
GFR, Estimated: 28 mL/min — ABNORMAL LOW (ref >60)
Glucose, Bld: 44 mg/dL — CL (ref 70–99)
Potassium: 3 mmol/L — ABNORMAL LOW (ref 3.5–5.1)
Sodium: 138 mmol/L (ref 135–145)

## 2023-09-28 LAB — CBC
HCT: 28.9 % — ABNORMAL LOW (ref 36.0–46.0)
Hemoglobin: 9.7 g/dL — ABNORMAL LOW (ref 12.0–15.0)
MCH: 31.1 pg (ref 26.0–34.0)
MCHC: 33.6 g/dL (ref 30.0–36.0)
MCV: 92.6 fL (ref 80.0–100.0)
Platelets: 161 10*3/uL (ref 150–400)
RBC: 3.12 MIL/uL — ABNORMAL LOW (ref 3.87–5.11)
RDW: 12.6 % (ref 11.5–15.5)
WBC: 5.9 10*3/uL (ref 4.0–10.5)
nRBC: 0 % (ref 0.0–0.2)

## 2023-09-28 LAB — GLUCOSE, CAPILLARY
Glucose-Capillary: 122 mg/dL — ABNORMAL HIGH (ref 70–99)
Glucose-Capillary: 107 mg/dL — ABNORMAL HIGH (ref 70–99)
Glucose-Capillary: 78 mg/dL (ref 70–99)
Glucose-Capillary: 175 mg/dL — ABNORMAL HIGH (ref 70–99)
Glucose-Capillary: 33 mg/dL — CL (ref 70–99)
Glucose-Capillary: 60 mg/dL — ABNORMAL LOW (ref 70–99)
Glucose-Capillary: 207 mg/dL — ABNORMAL HIGH (ref 70–99)
Glucose-Capillary: 140 mg/dL — ABNORMAL HIGH (ref 70–99)
Glucose-Capillary: 179 mg/dL — ABNORMAL HIGH (ref 70–99)
Glucose-Capillary: 304 mg/dL — ABNORMAL HIGH (ref 70–99)

## 2023-09-28 LAB — LACTIC ACID, PLASMA: Lactic Acid, Venous: 1.1 mmol/L (ref 0.5–1.9)

## 2023-09-28 LAB — MAGNESIUM: Magnesium: 2.1 mg/dL (ref 1.7–2.4)

## 2023-09-28 LAB — PHOSPHORUS: Phosphorus: 2.2 mg/dL — ABNORMAL LOW (ref 2.5–4.6)

## 2023-09-28 MED ORDER — LEVETIRACETAM 500 MG PO TABS
500.0000 mg | ORAL_TABLET | Freq: Two times a day (BID) | ORAL | Status: DC
  Administered 2023-09-28 – 2023-09-29 (×3): 500 mg
  Filled 2023-09-28 (×3): qty 1

## 2023-09-28 MED ORDER — INSULIN GLARGINE-YFGN 100 UNIT/ML ~~LOC~~ SOLN
8.0000 [IU] | Freq: Two times a day (BID) | SUBCUTANEOUS | Status: DC
Start: 2023-09-28 — End: 2023-09-28
  Filled 2023-09-28 (×2): qty 0.08

## 2023-09-28 MED ORDER — NOREPINEPHRINE 4 MG/250ML-% IV SOLN
0.0000 ug/min | INTRAVENOUS | Status: DC
  Administered 2023-09-28: 2 ug/min via INTRAVENOUS

## 2023-09-28 MED ORDER — POTASSIUM PHOSPHATES 15 MMOLE/5ML IV SOLN
30.0000 mmol | Freq: Once | INTRAVENOUS | Status: AC
  Administered 2023-09-28: 30 mmol via INTRAVENOUS
  Filled 2023-09-28: qty 10

## 2023-09-28 NOTE — Progress Notes (Addendum)
 NAME:  Lori Gates, MRN:  [id number], DOB:  24-Sep-1955, LOS: 4 ADMISSION DATE:  09/24/2023, CONSULTATION DATE:  4/29 REFERRING MD:  Dr. Lincoln Renshaw, CHIEF COMPLAINT:  seizures   History of Present Illness:  68 year old female w/ poorly controlled  IDDM, CKD stage IV, HTN, LLD, HFpEF, substance abuse (cocaine) followed by nephrology in [place].  Hx from ED: Daughter found on floor around 1500 actively seizing, CBG read "high". Continued to seize from time of EMS dispatch to arrival to ER. Pt had received 2.5 mg versed  in route. On arrival had right facial twitching, right gaze pref. Received 2 mg IV ativan . After this gaze pref returned to midline w/ snoring respirations, no further twitching, would grimace to noxious stim.  CT head negative for acute bleed, there was left periorbital facial swelling.  MRI neg for acute process per neuro.  Intubated by EDP for airway protection  Given IV keppra  (total 2 gms)  Initial lab wk: Na 129, glucose 989, anion gap 16, wbc 13, CK 121, trop 114, VBG Ph 7.08, pco2 81, bicarb 24, lactate 2.8-->2.9 Post intubation abg: 7.42/29/130/19.2 PCXR ett good position. 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 Down time not clear CBG "high" seized from EMS notification to arrival to ED> CT and MRI brain neg for acute bleed. Loaded 2 gm keppra . Intubated airway protection   Interim History /  Subjective:  Patient received hemodialysis for the first time yesterday, tolerated well Overnight she was hypertensive requiring IV push of hydralazine , blood pressure is better controlled now Remained afebrile Became hypoglycemic this morning  Objective   Blood pressure (!) 140/65, pulse (!) 54, temperature 97.8 F (36.6 C), temperature source Axillary, resp. rate (!) 24, height 5\' 5"  (1.651 m), weight 64 kg, SpO2 100%.    Vent Mode: PRVC FiO2 (%):  [30 %] 30 % Set Rate:  [24 bmp] 24 bmp Vt Set:  [450 mL] 450 mL PEEP:  [5 cmH20] 5 cmH20 Pressure Support:  [5 cmH20-8 cmH20] 5 cmH20 Plateau Pressure:  [20 cmH20-22 cmH20] 20 cmH20   Intake/Output Summary (Last 24 hours) at 09/28/2023 0745 Last data filed at 09/28/2023 0700 Gross per 24 hour  Intake 1308.09 ml  Output 550 ml  Net 758.09 ml   Filed Weights   09/26/23 0500 09/27/23 0853 09/28/23 0630  Weight: 63.8 kg 64 kg 64 kg    Examination: General: Crtitically ill-appearing elderly female, orally intubated HEENT: [place]/AT, eyes anicteric.  ETT and OGT in place Neuro: Eyes closed, does not open, not following commands, moving all 4 extremities with painful stimuli Chest: Coarse breath sounds, no wheezes or rhonchi Heart: Bradycardic, regular rhythm, no murmurs or gallops Abdomen: Soft, nondistended, bowel sounds present Skin: No rash  Labs pending today  Resolved Hospital Problem list     Assessment & Plan:  Acute metabolic encephalopathy secondary to DKA Acute toxic encephalopathy in the setting of cocaine abuse Sedation was turned off,  patient was on Precedex  and fentanyl  overnight Once she wakes up we will do spontaneous breathing trial Avoid deep sedation No signs of withdrawal DKA has been corrected  Seizure with post-ictal state Likely in the setting of DKA with blood sugar close to 1000 Continue Keppra  500 mg twice daily for now, patient may not need for long considering it was reversible cause of  seizures Appreciate neurology follow-up  Acute hypoxic/hypercapnic respiratory failure Right lower lobe pneumonia likely aspiration Continue lung protective ventilation VAP prevention bundle in place Sedation is off Patient did not tolerate spontaneous breathing trial this morning due to apnea, will wait for sedation to wear off, then will do spontaneous breathing trial and probable extubation today Respiratory culture is growing gram-positive cocci in pairs, continue IV ceftriaxone  to complete 7 days therapy  DKA with coma Patient became hypoglycemic this morning with serum glucose of 44 Received 1 amp of D50 Will stop Lantus  Continue sliding scale insulin  with CBG goal 140-180  AKI on CKD 4, requiring hemodialysis Hyponatremia, hypophosphatemia Hyperkalemia LUE AVF placed in 2024 Made 550 cc of urine last 24 hours Nephrology is following Deceived full session of hemodialysis yesterday Closely monitor electrolytes  Sepsis due to UTI and right lower lobe pneumonia, POA Lactic acidosis Urine culture grew lactobacillus, likely contaminant Continue IV antibiotics with ceftriaxone  Repeat lactate is 1.1  Demand cardiac ischemia Echocardiogram showed no wall motion abnormalities with normal EF EKG showed no ST or T wave changes  LUE superficial venous thrombosis Continue warm compress and elevation of left arm  HTN HLD HFpEF was ruled out with normal echocardiogram Patient became hypertensive overnight, requiring IV hydralazine .  Started on amlodipine  Hold beta-blocker considering patient is bradycardic Continue statin  Severe protein calorie malnutrition Continue dietary supplements  Best Practice (right click and "Reselect all SmartList Selections" daily)   Diet/type: Tube feed DVT prophylaxis prophylactic heparin   Pressure ulcer(s): N/A GI prophylaxis: H2B Lines: N/A Foley:  Yes, and it is still needed Code Status:  full code Last date of multidisciplinary goals  of care discussion [4/30 updated grandson over phone.   The patient is critically ill due to acute encephalopathy in the setting of DKA/seizures/acute respiratory failure with hypoxia and hypercapnia.  Critical care was necessary to treat or prevent imminent or life-threatening deterioration.  Critical care was time spent personally by me on the following activities: development of treatment plan with patient and/or surrogate as well as nursing, discussions with consultants, evaluation of patient's response to treatment, examination of patient, obtaining history from patient or surrogate, ordering and performing treatments and interventions, ordering and review of laboratory studies, ordering and review of radiographic studies, pulse oximetry, re-evaluation of patient's condition and participation in multidisciplinary rounds.   During this encounter critical care time was devoted to patient care services described in this note for 36 minutes.    Trevor Fudge, MD [facility] See Amion for pager If no response to pager, please call [phone] until 7pm After 7pm, Please call E-link [phone]

## 2023-09-28 NOTE — Progress Notes (Signed)
 CCM paged regarding hypotension.  Verbal order for levophed .

## 2023-09-28 NOTE — Plan of Care (Signed)
 Patient intubated and sedated and unable to understand plan of care at this time

## 2023-09-28 NOTE — Progress Notes (Signed)
 AH TROST is an 68 y.o. female  w/ IDDM, HTN, CKD4 found down on the floor by her daughter seizing. CGB read "high". EMS gave versed  and ED at [facility] gave Ativan  2mg  IV. CT head neg and MRI head was negative. Pt was intubated for airway protection. Drug screen was + for cocaine and BZD's. Pt was rx'd w/ IV insulin  protocol for HHS vs DKA. Pt was admitted to ICU at [facility]. Initial BS's were > 600, BUN 18, creat 2.73 , eGFR 18 ml/min. Pt had fevers to 101. Pressors have not been needed. UOP is poor w/ 90 cc yesterday and 60 cc today. CXR showing progressive RLL infiltrate. No edema.  Not on dialysis yet but recently put a fistula in her left arm about 1 month ago.  She was living in [place]  but recently moved to [place], [place].  Assessment/Plan:  CKD 4 - b/l creatinine is 2.5- 2.8 from mid-late 2024, eGFR 17- 21 ml/min. Creat here is 2.6- 2.7 in baseline range. AVF was placed in L upper ext per the family, but the patient is not on dialysis. Not sure who follows her at this time, she recently moved from [place] to [place]. D/W daughter Melissa Broadnax about need to start hd and she agreed on 5/1. +8.3L - Appreciate temp catheter by CCM. - Tolerated dialysis early 5/2; holding HD today. UOP /24hrs and K3. Next HD Monday if needed; would like to see if she has enough function to be off HD again.  -Monitor Daily I/Os, Daily weight  -Maintain MAP>65 for optimal renal perfusion.  - Avoid nephrotoxic agents such as IV contrast, NSAIDs, and phosphate containing bowel preps (FLEETS)  Volume: sig bilat LE edema, some UE edema as well. CXR is clear. BP's are soft so will hold off on diuresing for now.  Seizures: per neurology, treat metabolic derangements Drug abuse: cocaine and BZD's were+ on drug screen DKA w/ coma: per CCM UTI/ sepsis: w/ borderline hypotension, on IV rocephin  Acute hypoxic resp failure - on vent LUE AVF: suspected AVF, good bruit, per family was placed/  created about 1 month ago when she was in [place]  Subjective: Intubated for airway protection but not on pressors; sedation off.   Chemistry and CBC: Creatinine, Ser  Date/Time Value Ref Range Status  09/28/2023 07:21 AM 1.92 (H) 0.44 - 1.00 mg/dL Final  29/56/2130 86:57 AM 2.89 (H) 0.44 - 1.00 mg/dL Final  84/69/6295 28:41 AM 2.90 (H) 0.44 - 1.00 mg/dL Final  32/44/0102 72:53 PM 2.81 (H) 0.44 - 1.00 mg/dL Final  66/44/0347 42:59 PM 2.72 (H) 0.44 - 1.00 mg/dL Final  56/38/7564 33:29 PM 2.77 (H) 0.44 - 1.00 mg/dL Final  51/88/4166 06:30 AM 2.73 (H) 0.44 - 1.00 mg/dL Final  16/05/930 35:57 AM 2.66 (H) 0.44 - 1.00 mg/dL Final  32/20/2542 70:62 PM 2.58 (H) 0.44 - 1.00 mg/dL Final  37/62/8315 17:61 PM 2.64 (H) 0.44 - 1.00 mg/dL Final  60/73/7106 26:94 PM 1.09 (H) 0.44 - 1.00 mg/dL Final  85/46/2703 50:09 PM 0.99 0.50 - 1.10 mg/dL Final   Recent Labs  Lab 09/25/23 0955 09/25/23 1626 09/25/23 1931 09/25/23 2317 09/26/23 0622 09/27/23 0600 09/28/23 0721  NA 138 138 139 140 138 140 138  K 3.1* 4.1 4.4 5.0 5.7* 4.1 3.0*  CL 102 104 104 105 106 106 107  CO2 20* 19* 21* 22 21* 24 22  GLUCOSE 251* 165* 152* 138* 132* 173* 44*  BUN 18 19 19 20 21  29*  23  CREATININE 2.73* 2.77* 2.72* 2.81* 2.90* 2.89* 1.92*  CALCIUM  8.4* 8.4* 8.4* 8.4* 8.2* 7.9* 7.9*  PHOS  --   --   --   --   --  2.3* 2.2*   Recent Labs  Lab 09/24/23 1534 09/25/23 0218 09/25/23 0447 09/25/23 0955 09/27/23 0600 09/28/23 0721  WBC 13.0*  --  8.6 6.2 10.9* 5.9  NEUTROABS 11.4*  --  6.3  --   --   --   HGB 12.0   < > 12.7 11.3* 8.8* 9.7*  HCT 38.7   < > 38.2 32.8* 26.5* 28.9*  MCV 100.0  --  94.3 93.2 95.3 92.6  PLT 277  --  235 138* 160 161   < > = values in this interval not displayed.   Liver Function Tests: Recent Labs  Lab 09/24/23 1534  AST 19  ALT 12  ALKPHOS 136*  BILITOT 0.6  PROT 7.6  ALBUMIN 3.6   No results for input(s): "LIPASE", "AMYLASE" in the last 168 hours. Recent Labs  Lab  09/25/23 0447  AMMONIA 28   Cardiac Enzymes: Recent Labs  Lab 09/24/23 1534  CKTOTAL 121   Iron Studies: No results for input(s): "IRON", "TIBC", "TRANSFERRIN", "FERRITIN" in the last 72 hours. PT/INR: @LABRCNTIP (inr:5)  Xrays/Other Studies: ) Results for orders placed or performed during the hospital encounter of 09/24/23 (from the past 48 hours)  Glucose, capillary     Status: Abnormal   Collection Time: 09/26/23  9:50 AM  Result Value Ref Range   Glucose-Capillary 167 (H) 70 - 99 mg/dL    Comment: Glucose reference range applies only to samples taken after fasting for at least 8 hours.  Glucose, capillary     Status: Abnormal   Collection Time: 09/26/23 11:56 AM  Result Value Ref Range   Glucose-Capillary 145 (H) 70 - 99 mg/dL    Comment: Glucose reference range applies only to samples taken after fasting for at least 8 hours.  Glucose, capillary     Status: Abnormal   Collection Time: 09/26/23 12:57 PM  Result Value Ref Range   Glucose-Capillary 152 (H) 70 - 99 mg/dL    Comment: Glucose reference range applies only to samples taken after fasting for at least 8 hours.  Glucose, capillary     Status: Abnormal   Collection Time: 09/26/23  2:03 PM  Result Value Ref Range   Glucose-Capillary 165 (H) 70 - 99 mg/dL    Comment: Glucose reference range applies only to samples taken after fasting for at least 8 hours.  Glucose, capillary     Status: Abnormal   Collection Time: 09/26/23  4:13 PM  Result Value Ref Range   Glucose-Capillary 155 (H) 70 - 99 mg/dL    Comment: Glucose reference range applies only to samples taken after fasting for at least 8 hours.  Glucose, capillary     Status: Abnormal   Collection Time: 09/26/23  6:14 PM  Result Value Ref Range   Glucose-Capillary 128 (H) 70 - 99 mg/dL    Comment: Glucose reference range applies only to samples taken after fasting for at least 8 hours.  Glucose, capillary     Status: Abnormal   Collection Time: 09/26/23  8:03  PM  Result Value Ref Range   Glucose-Capillary 168 (H) 70 - 99 mg/dL    Comment: Glucose reference range applies only to samples taken after fasting for at least 8 hours.  Glucose, capillary     Status: Abnormal  Collection Time: 09/26/23 11:27 PM  Result Value Ref Range   Glucose-Capillary 174 (H) 70 - 99 mg/dL    Comment: Glucose reference range applies only to samples taken after fasting for at least 8 hours.  Glucose, capillary     Status: Abnormal   Collection Time: 09/27/23  3:25 AM  Result Value Ref Range   Glucose-Capillary 181 (H) 70 - 99 mg/dL    Comment: Glucose reference range applies only to samples taken after fasting for at least 8 hours.  Basic metabolic panel     Status: Abnormal   Collection Time: 09/27/23  6:00 AM  Result Value Ref Range   Sodium 140 135 - 145 mmol/L   Potassium 4.1 3.5 - 5.1 mmol/L   Chloride 106 98 - 111 mmol/L   CO2 24 22 - 32 mmol/L   Glucose, Bld 173 (H) 70 - 99 mg/dL    Comment: Glucose reference range applies only to samples taken after fasting for at least 8 hours.   BUN 29 (H) 8 - 23 mg/dL   Creatinine, Ser 1.61 (H) 0.44 - 1.00 mg/dL   Calcium  7.9 (L) 8.9 - 10.3 mg/dL   GFR, Estimated 17 (L) >60 mL/min    Comment: (NOTE) Calculated using the CKD-EPI Creatinine Equation (2021)    Anion gap 10 5 - 15    Comment: Performed at [facility], [address], [place], [place] [address]  CBC     Status: Abnormal   Collection Time: 09/27/23  6:00 AM  Result Value Ref Range   WBC 10.9 (H) 4.0 - 10.5 K/uL   RBC 2.78 (L) 3.87 - 5.11 MIL/uL   Hemoglobin 8.8 (L) 12.0 - 15.0 g/dL    Comment: REPEATED TO VERIFY   HCT 26.5 (L) 36.0 - 46.0 %   MCV 95.3 80.0 - 100.0 fL   MCH 31.7 26.0 - 34.0 pg   MCHC 33.2 30.0 - 36.0 g/dL   RDW 54.0 98.1 - 19.1 %   Platelets 160 150 - 400 K/uL    Comment: CONSISTENT WITH PREVIOUS RESULT REPEATED TO VERIFY    nRBC 0.0 0.0 - 0.2 %    Comment: Performed at [facility], [address]  [place], [place] [address]  Magnesium      Status: None   Collection Time: 09/27/23  6:00 AM  Result Value Ref Range   Magnesium  1.8 1.7 - 2.4 mg/dL    Comment: Performed at [facility], [address] [place], [place] [address]  Phosphorus     Status: Abnormal   Collection Time: 09/27/23  6:00 AM  Result Value Ref Range   Phosphorus 2.3 (L) 2.5 - 4.6 mg/dL    Comment: Performed at [facility], [address] [place], [place] [address]  Glucose, capillary     Status: Abnormal   Collection Time: 09/27/23  7:10 AM  Result Value Ref Range   Glucose-Capillary 142 (H) 70 - 99 mg/dL    Comment: Glucose reference range applies only to samples taken after fasting for at least 8 hours.  Glucose, capillary     Status: Abnormal   Collection Time: 09/27/23 12:06 PM  Result Value Ref Range   Glucose-Capillary 144 (H) 70 - 99 mg/dL    Comment: Glucose reference range applies only to samples taken after fasting for at least 8 hours.  Glucose, capillary     Status: Abnormal   Collection Time: 09/27/23  3:22 PM  Result Value  Ref Range   Glucose-Capillary 144 (H) 70 - 99 mg/dL    Comment: Glucose reference range applies only to samples taken after fasting for at least 8 hours.  Glucose, capillary     Status: Abnormal   Collection Time: 09/27/23  7:18 PM  Result Value Ref Range   Glucose-Capillary 122 (H) 70 - 99 mg/dL    Comment: Glucose reference range applies only to samples taken after fasting for at least 8 hours.  Glucose, capillary     Status: Abnormal   Collection Time: 09/27/23 11:07 PM  Result Value Ref Range   Glucose-Capillary 107 (H) 70 - 99 mg/dL    Comment: Glucose reference range applies only to samples taken after fasting for at least 8 hours.  Glucose, capillary     Status: None   Collection Time: 09/28/23  3:06 AM  Result Value Ref Range   Glucose-Capillary 78 70 - 99 mg/dL    Comment: Glucose reference range applies only to samples taken after fasting for at least 8  hours.  Lactic acid, plasma     Status: None   Collection Time: 09/28/23  7:21 AM  Result Value Ref Range   Lactic Acid, Venous 1.1 0.5 - 1.9 mmol/L    Comment: Performed at [facility], [address], [place], [place] [address]  Basic metabolic panel     Status: Abnormal   Collection Time: 09/28/23  7:21 AM  Result Value Ref Range   Sodium 138 135 - 145 mmol/L   Potassium 3.0 (L) 3.5 - 5.1 mmol/L   Chloride 107 98 - 111 mmol/L   CO2 22 22 - 32 mmol/L   Glucose, Bld 44 (LL) 70 - 99 mg/dL    Comment: CRITICAL RESULT CALLED TO, READ BACK BY AND VERIFIED WITH CHAN.T RN @0814  09/28/23 S OUROBANGNA Glucose reference range applies only to samples taken after fasting for at least 8 hours.    BUN 23 8 - 23 mg/dL   Creatinine, Ser 6.04 (H) 0.44 - 1.00 mg/dL   Calcium  7.9 (L) 8.9 - 10.3 mg/dL   GFR, Estimated 28 (L) >60 mL/min    Comment: (NOTE) Calculated using the CKD-EPI Creatinine Equation (2021)    Anion gap 9 5 - 15    Comment: Performed at [facility], [address] [place], [place] [address]  Magnesium      Status: None   Collection Time: 09/28/23  7:21 AM  Result Value Ref Range   Magnesium  2.1 1.7 - 2.4 mg/dL    Comment: Performed at [facility], [address] [place], [place] [address]  Phosphorus     Status: Abnormal   Collection Time: 09/28/23  7:21 AM  Result Value Ref Range   Phosphorus 2.2 (L) 2.5 - 4.6 mg/dL    Comment: Performed at [facility], [address] [place], [place] [address]  CBC     Status: Abnormal   Collection Time: 09/28/23  7:21 AM  Result Value Ref Range   WBC 5.9 4.0 - 10.5 K/uL   RBC 3.12 (L) 3.87 - 5.11 MIL/uL   Hemoglobin 9.7 (L) 12.0 - 15.0 g/dL   HCT 62.1 (L) 30.8 - 65.7 %   MCV 92.6 80.0 - 100.0 fL   MCH 31.1 26.0 - 34.0 pg   MCHC 33.6 30.0 - 36.0 g/dL   RDW 84.6 96.2 - 95.2 %   Platelets 161 150 - 400 K/uL   nRBC 0.0 0.0 - 0.2 %  Comment: Performed at [facility], [address]  [place], [place] [address]  Glucose, capillary     Status: Abnormal   Collection Time: 09/28/23  8:05 AM  Result Value Ref Range   Glucose-Capillary 33 (LL) 70 - 99 mg/dL    Comment: Glucose reference range applies only to samples taken after fasting for at least 8 hours.   Comment 1 Notify RN   Glucose, capillary     Status: Abnormal   Collection Time: 09/28/23  8:24 AM  Result Value Ref Range   Glucose-Capillary 175 (H) 70 - 99 mg/dL    Comment: Glucose reference range applies only to samples taken after fasting for at least 8 hours.   ECHOCARDIOGRAM COMPLETE Result Date: 09/27/2023    ECHOCARDIOGRAM REPORT   Patient Name:   GRACLYN JARRIEL Date of Exam: 09/27/2023 Medical Rec #:  [id number]        Height:       65.0 in Accession #:    [id number]       Weight:       141.1 lb Date of Birth:  1956-04-19       BSA:          1.706 m Patient Age:    67 years         BP:           152/63 mmHg Patient Gender: F                HR:           82 bpm. Exam Location:  Inpatient Procedure: 2D Echo, 3D Echo, Cardiac Doppler, Color Doppler and Strain Analysis            (Both Spectral and Color Flow Doppler were utilized during            procedure). Indications:    CHF-acute diastolic  History:        Patient has no prior history of Echocardiogram examinations.  Sonographer:    Juanita Shaw Referring Phys: [id number] SUDHAM CHAND IMPRESSIONS  1. Left ventricular ejection fraction, by estimation, is 65 to 70%. Left ventricular ejection fraction by 3D volume is 60 %. The left ventricle has normal function. The left ventricle has no regional wall motion abnormalities. There is moderate concentric left ventricular hypertrophy. Left ventricular diastolic parameters are indeterminate. The average left ventricular global longitudinal strain is -21.2 %. The global longitudinal strain is normal.  2. Right ventricular systolic function is normal. The right ventricular size is normal. There is mildly elevated pulmonary artery systolic  pressure.  3. Left atrial size was mild to moderately dilated.  4. The mitral valve is degenerative. Mild mitral valve regurgitation. No evidence of mitral stenosis.  5. The aortic valve is normal in structure. There is mild calcification of the aortic valve. Aortic valve regurgitation is mild. No aortic stenosis is present.  6. The inferior vena cava is normal in size with greater than 50% respiratory variability, suggesting right atrial pressure of 3 mmHg. FINDINGS  Left Ventricle: Left ventricular ejection fraction, by estimation, is 65 to 70%. Left ventricular ejection fraction by 3D volume is 60 %. The left ventricle has normal function. The left ventricle has no regional wall motion abnormalities. The average left ventricular global longitudinal strain is -21.2 %. Strain was performed and the global longitudinal strain is normal. The left ventricular internal cavity size was normal in size. There is moderate concentric left ventricular hypertrophy. Left ventricular diastolic parameters  are indeterminate. Right Ventricle: The right ventricular size is normal. No increase in right ventricular wall thickness. Right ventricular systolic function is normal. There is mildly elevated pulmonary artery systolic pressure. The tricuspid regurgitant velocity is 3.02  m/s, and with an assumed right atrial pressure of 8 mmHg, the estimated right ventricular systolic pressure is 44.5 mmHg. Left Atrium: Left atrial size was mild to moderately dilated. Right Atrium: Right atrial size was normal in size. Pericardium: There is no evidence of pericardial effusion. Mitral Valve: The mitral valve is degenerative in appearance. Mild mitral annular calcification. Mild mitral valve regurgitation. No evidence of mitral valve stenosis. MV peak gradient, 3.7 mmHg. The mean mitral valve gradient is 2.0 mmHg. Tricuspid Valve: The tricuspid valve is normal in structure. Tricuspid valve regurgitation is mild . No evidence of tricuspid  stenosis. Aortic Valve: The aortic valve is normal in structure. There is mild calcification of the aortic valve. Aortic valve regurgitation is mild. No aortic stenosis is present. Aortic valve mean gradient measures 2.0 mmHg. Aortic valve peak gradient measures 3.5 mmHg. Aortic valve area, by VTI measures 4.76 cm. Pulmonic Valve: The pulmonic valve was normal in structure. Pulmonic valve regurgitation is trivial. No evidence of pulmonic stenosis. Aorta: The aortic root is normal in size and structure. Venous: The inferior vena cava is normal in size with greater than 50% respiratory variability, suggesting right atrial pressure of 3 mmHg. IAS/Shunts: No atrial level shunt detected by color flow Doppler. Additional Comments: 3D was performed not requiring image post processing on an independent workstation and was normal.  LEFT VENTRICLE PLAX 2D LVIDd:         3.00 cm         Diastology LVIDs:         2.00 cm         LV e' medial:    4.82 cm/s LV PW:         0.80 cm         LV E/e' medial:  18.2 LV IVS:        1.20 cm         LV e' lateral:   8.59 cm/s LVOT diam:     2.10 cm         LV E/e' lateral: 10.2 LV SV:         72 LV SV Index:   42              2D Longitudinal LVOT Area:     3.46 cm        Strain                                2D Strain GLS   -21.2 %                                Avg: LV Volumes (MOD) LV vol d, MOD    89.1 ml       3D Volume EF A2C:                           LV 3D EF:    Left LV vol d, MOD    93.6 ml                    ventricul A4C:  ar LV vol s, MOD    32.9 ml                    ejection A2C:                                        fraction LV vol s, MOD    32.3 ml                    by 3D A4C:                                        volume is LV SV MOD A2C:   56.2 ml                    60 %. LV SV MOD A4C:   93.6 ml LV SV MOD BP:    64.6 ml                                3D Volume EF:                                3D EF:        60 %                                 LV EDV:       122 ml                                LV ESV:       49 ml                                LV SV:        73 ml RIGHT VENTRICLE             IVC RV Basal diam:  3.50 cm     IVC diam: 1.60 cm RV Mid diam:    2.30 cm RV S prime:     14.40 cm/s TAPSE (M-mode): 2.5 cm LEFT ATRIUM           Index        RIGHT ATRIUM           Index LA diam:      3.20 cm 1.88 cm/m   RA Area:     12.40 cm LA Vol (A2C): 52.9 ml 31.02 ml/m  RA Volume:   28.30 ml  16.59 ml/m LA Vol (A4C): 53.6 ml 31.43 ml/m  AORTIC VALVE                    PULMONIC VALVE AV Area (Vmax):    3.75 cm     PV Vmax:       0.61 m/s AV Area (Vmean):   3.70 cm     PV Peak grad:  1.5 mmHg AV Area (VTI):     4.76 cm AV Vmax:  94.10 cm/s AV Vmean:          65.600 cm/s AV VTI:            0.152 m AV Peak Grad:      3.5 mmHg AV Mean Grad:      2.0 mmHg LVOT Vmax:         102.00 cm/s LVOT Vmean:        70.100 cm/s LVOT VTI:          0.209 m LVOT/AV VTI ratio: 1.38  AORTA Ao Root diam: 3.30 cm Ao Asc diam:  3.20 cm MITRAL VALVE               TRICUSPID VALVE MV Area (PHT): 4.63 cm    TR Peak grad:   36.5 mmHg MV Area VTI:   4.52 cm    TR Vmax:        302.00 cm/s MV Peak grad:  3.7 mmHg MV Mean grad:  2.0 mmHg    SHUNTS MV Vmax:       0.96 m/s    Systemic VTI:  0.21 m MV Vmean:      60.0 cm/s   Systemic Diam: 2.10 cm MV Decel Time: 164 msec MV E velocity: 87.80 cm/s MV A velocity: 76.40 cm/s MV E/A ratio:  1.15 Jules Oar MD Electronically signed by Jules Oar MD Signature Date/Time: 09/27/2023/11:34:55 AM    Final     PMH:   Past Medical History:  Diagnosis Date   Asthma    Diabetes mellitus without complication (HCC)    Hypertension     PSH:   Past Surgical History:  Procedure Laterality Date   TUBAL LIGATION      Allergies: No Known Allergies  Medications:   Prior to Admission medications   Medication Sig Start Date End Date Taking? Authorizing Provider  albuterol -ipratropium (COMBIVENT) 18-103  MCG/ACT inhaler Inhale 2 puffs into the lungs every 6 (six) hours as needed. For shortness of breath    [provider]  amLODipine  (NORVASC ) 5 MG tablet Take 5 mg by mouth daily.    [provider]  atorvastatin  (LIPITOR ) 80 MG tablet Take 80 mg by mouth daily.    [provider]  ibuprofen (ADVIL,MOTRIN) 200 MG tablet Take 400 mg by mouth every 6 (six) hours as needed. For pain    [provider]  insulin  aspart protamine- aspart (NOVOLOG  MIX 70/30) (70-30) 100 UNIT/ML injection Inject 0.15 mLs (15 Units total) into the skin daily with breakfast. 05/27/21   Darletta Ehrich, PA-C  insulin  aspart protamine- aspart (NOVOLOG  MIX 70/30) (70-30) 100 UNIT/ML injection Inject 0.15 mLs (15 Units total) into the skin daily with breakfast. 05/28/21   Wynetta Heckle, MD  insulin  lispro (HUMALOG) 100 UNIT/ML KwikPen Inject 13 Units into the skin 3 (three) times daily. 07/14/23   [provider]  LANTUS  SOLOSTAR 100 UNIT/ML Solostar Pen Inject 20 Units into the skin at bedtime.    [provider]  nebivolol (BYSTOLIC) 2.5 MG tablet Take 2.5 mg by mouth daily. 07/14/23   [provider]  ondansetron  (ZOFRAN ) 4 MG tablet Take 1 tablet (4 mg total) by mouth every 8 (eight) hours as needed for nausea. 05/06/12   Pisciotta, Peterson Brandt, PA-C  oseltamivir  (TAMIFLU ) 75 MG capsule Take 1 capsule (75 mg total) by mouth every 12 (twelve) hours. 05/06/12   Pisciotta, Peterson Brandt, PA-C  RYBELSUS 3 MG TABS Take 2 tablets by mouth daily.    [provider]  thiamine  (VITAMIN B-1) 100 MG tablet Take 100 mg by mouth daily.    [provider]  torsemide (DEMADEX) 20 MG tablet Take 20 mg by mouth daily.    [provider]    Discontinued Meds:   Medications Discontinued During This Encounter  Medication Reason   insulin  glargine (LANTUS ) 100 UNIT/ML injection Dose change   amLODipine  (NORVASC ) 10 MG tablet Dose change   propofol  (DIPRIVAN ) 10  mg/mL bolus/IV push Returned to ADS   lactated ringers  bolus 1,000 mL    levETIRAcetam  (KEPPRA ) 1000 MG/100ML IVPB    levETIRAcetam  (KEPPRA ) IVPB 1000 mg/100 mL premix    propofol  (DIPRIVAN ) 1000 MG/100ML infusion    famotidine  (PEPCID ) tablet 20 mg Duplicate   Chlorhexidine  Gluconate Cloth 2 % PADS 6 each    famotidine  (PEPCID ) tablet 20 mg    potassium chloride  SA (KLOR-CON  M) CR tablet 40 mEq    propofol  (DIPRIVAN ) 1000 MG/100ML infusion    insulin  regular, human (MYXREDLIN ) 100 units/ 100 mL infusion    docusate (COLACE) 50 MG/5ML liquid 100 mg    norepinephrine  (LEVOPHED ) 4mg  in (0.016 mg/mL) premix infusion    insulin  glargine-yfgn (SEMGLEE ) injection 10 Units    fentaNYL  (SUBLIMAZE ) injection 25 mcg    fentaNYL  in NS (70mcg/ml) infusion-PREMIX    fentaNYL  (SUBLIMAZE ) bolus via infusion 25-100 mcg    dexmedetomidine  (PRECEDEX ) 400 MCG/100ML (4 mcg/mL) infusion    levETIRAcetam  (KEPPRA ) IVPB 500 mg/100 mL premix P&T Policy: IV to PO Conversion   insulin  glargine-yfgn (SEMGLEE ) injection 8 Units     Social History:  reports that she has quit smoking. Her smoking use included cigarettes. She does not have any smokeless tobacco history on file. She reports that she does not drink alcohol. No history on file for drug use.  Family History:  History reviewed. No pertinent family history.  Blood pressure 133/62, pulse (!) 49, temperature 97.8 F (36.6 C), temperature source Axillary, resp. rate (!) 24, height 5\' 5"  (1.651 m), weight 64 kg, SpO2 100%. Physical Exam: Gen on vent No rash, cyanosis or gangrene Sclera anicteric, throat w/ ETT No jvd or bruits Chest clear anterior/ lateral RRR no MRG Abd soft ntnd no mass or ascites +bs GU normal MS no joint effusions or deformity Ext diffuse 1-2+ bilat LE and LUE edema Neuro is on vent but moving extremities  LUA BCF w/ + bruit but ipsilateral swelling, RIJ temp     Kristel Durkee, Alveda Aures, MD 09/28/2023, 9:43 AM

## 2023-09-29 ENCOUNTER — Inpatient Hospital Stay (HOSPITAL_COMMUNITY)

## 2023-09-29 DIAGNOSIS — R569 Unspecified convulsions: Secondary | ICD-10-CM | POA: Diagnosis not present

## 2023-09-29 DIAGNOSIS — E1111 Type 2 diabetes mellitus with ketoacidosis with coma: Secondary | ICD-10-CM | POA: Diagnosis not present

## 2023-09-29 DIAGNOSIS — G928 Other toxic encephalopathy: Secondary | ICD-10-CM | POA: Diagnosis not present

## 2023-09-29 DIAGNOSIS — J9601 Acute respiratory failure with hypoxia: Secondary | ICD-10-CM | POA: Diagnosis not present

## 2023-09-29 LAB — CULTURE, BLOOD (ROUTINE X 2)
Culture: NO GROWTH
Culture: NO GROWTH

## 2023-09-29 LAB — GLUCOSE, CAPILLARY
Glucose-Capillary: 107 mg/dL — ABNORMAL HIGH (ref 70–99)
Glucose-Capillary: 122 mg/dL — ABNORMAL HIGH (ref 70–99)
Glucose-Capillary: 129 mg/dL — ABNORMAL HIGH (ref 70–99)
Glucose-Capillary: 137 mg/dL — ABNORMAL HIGH (ref 70–99)
Glucose-Capillary: 138 mg/dL — ABNORMAL HIGH (ref 70–99)
Glucose-Capillary: 252 mg/dL — ABNORMAL HIGH (ref 70–99)
Glucose-Capillary: 279 mg/dL — ABNORMAL HIGH (ref 70–99)

## 2023-09-29 LAB — PHOSPHORUS: Phosphorus: 4.2 mg/dL (ref 2.5–4.6)

## 2023-09-29 LAB — BASIC METABOLIC PANEL WITH GFR
Anion gap: 17 — ABNORMAL HIGH (ref 5–15)
BUN: 35 mg/dL — ABNORMAL HIGH (ref 8–23)
CO2: 23 mmol/L (ref 22–32)
Calcium: 8.1 mg/dL — ABNORMAL LOW (ref 8.9–10.3)
Chloride: 99 mmol/L (ref 98–111)
Creatinine, Ser: 2.34 mg/dL — ABNORMAL HIGH (ref 0.44–1.00)
GFR, Estimated: 22 mL/min — ABNORMAL LOW (ref >60)
Glucose, Bld: 318 mg/dL — ABNORMAL HIGH (ref 70–99)
Potassium: 4.1 mmol/L (ref 3.5–5.1)
Sodium: 139 mmol/L (ref 135–145)

## 2023-09-29 LAB — MAGNESIUM: Magnesium: 2.2 mg/dL (ref 1.7–2.4)

## 2023-09-29 MED ORDER — LEVETIRACETAM 500 MG PO TABS: 500.0000 mg | ORAL_TABLET | Freq: Two times a day (BID) | ORAL | Status: DC

## 2023-09-29 MED ORDER — DEXMEDETOMIDINE HCL IN NACL 400 MCG/100ML IV SOLN
0.0000 ug/kg/h | INTRAVENOUS | Status: DC
  Administered 2023-09-29: 0.4 ug/kg/h via INTRAVENOUS
  Administered 2023-09-30: 1 ug/kg/h via INTRAVENOUS
  Filled 2023-09-29 (×2): qty 100

## 2023-09-29 MED ORDER — ALBUTEROL SULFATE (2.5 MG/3ML) 0.083% IN NEBU: 2.5000 mg | INHALATION_SOLUTION | RESPIRATORY_TRACT | Status: DC | PRN

## 2023-09-29 MED ORDER — PROSOURCE PLUS PO LIQD
30.0000 mL | Freq: Two times a day (BID) | ORAL | Status: DC
  Filled 2023-09-29 (×3): qty 30

## 2023-09-29 MED ORDER — INSULIN GLARGINE-YFGN 100 UNIT/ML ~~LOC~~ SOLN
5.0000 [IU] | Freq: Two times a day (BID) | SUBCUTANEOUS | Status: DC
  Administered 2023-09-29 – 2023-10-01 (×4): 5 [IU] via SUBCUTANEOUS
  Filled 2023-09-29 (×7): qty 0.05

## 2023-09-29 MED ORDER — POLYETHYLENE GLYCOL 3350 17 G PO PACK: 17.0000 g | PACK | Freq: Every day | ORAL | Status: DC | PRN

## 2023-09-29 MED ORDER — AMLODIPINE BESYLATE 5 MG PO TABS
5.0000 mg | ORAL_TABLET | Freq: Every day | ORAL | Status: DC
Start: 2023-09-30 — End: 2023-09-30
  Filled 2023-09-29: qty 1

## 2023-09-29 MED ORDER — THIAMINE MONONITRATE 100 MG PO TABS
100.0000 mg | ORAL_TABLET | Freq: Every day | ORAL | Status: DC
  Filled 2023-09-29: qty 1

## 2023-09-29 MED ORDER — ACETAMINOPHEN 325 MG PO TABS: 650.0000 mg | ORAL_TABLET | ORAL | Status: DC | PRN

## 2023-09-29 MED ORDER — AMLODIPINE BESYLATE 5 MG PO TABS: 5.0000 mg | ORAL_TABLET | Freq: Every day | ORAL | Status: DC

## 2023-09-29 MED ORDER — POLYETHYLENE GLYCOL 3350 17 G PO PACK: 17.0000 g | PACK | Freq: Every day | ORAL | Status: DC

## 2023-09-29 MED ORDER — ATORVASTATIN CALCIUM 80 MG PO TABS
80.0000 mg | ORAL_TABLET | Freq: Every day | ORAL | Status: DC
  Filled 2023-09-29: qty 1

## 2023-09-29 MED ORDER — FAMOTIDINE 20 MG PO TABS: 10.0000 mg | ORAL_TABLET | Freq: Every day | ORAL | Status: DC

## 2023-09-29 MED ORDER — IPRATROPIUM-ALBUTEROL 0.5-2.5 (3) MG/3ML IN SOLN
3.0000 mL | Freq: Four times a day (QID) | RESPIRATORY_TRACT | Status: DC
  Administered 2023-09-29 – 2023-10-01 (×4): 3 mL via RESPIRATORY_TRACT
  Filled 2023-09-29 (×6): qty 3

## 2023-09-29 MED ORDER — LEVETIRACETAM 500 MG PO TABS
500.0000 mg | ORAL_TABLET | Freq: Two times a day (BID) | ORAL | Status: DC
  Filled 2023-09-29: qty 1

## 2023-09-29 MED ORDER — RACEPINEPHRINE HCL 2.25 % IN NEBU
0.5000 mL | INHALATION_SOLUTION | Freq: Once | RESPIRATORY_TRACT | Status: AC
  Administered 2023-09-29: 0.5 mL via RESPIRATORY_TRACT
  Filled 2023-09-29: qty 0.5

## 2023-09-29 MED ORDER — LEVETIRACETAM IN NACL 500 MG/100ML IV SOLN
500.0000 mg | Freq: Two times a day (BID) | INTRAVENOUS | Status: DC
  Administered 2023-09-29: 500 mg via INTRAVENOUS
  Filled 2023-09-29: qty 100

## 2023-09-29 MED ORDER — DOCUSATE SODIUM 100 MG PO CAPS: 100.0000 mg | ORAL_CAPSULE | Freq: Two times a day (BID) | ORAL | Status: DC | PRN

## 2023-09-29 NOTE — Progress Notes (Addendum)
 eLink Physician-Brief Progress Note Patient Name: Lori Gates DOB: 1955-08-16 MRN: [id number]   Date of Service  09/29/2023  HPI/Events of Note  Lori Gates is a 68 year old female who presented to ED after daughter found on floor actively seizing, extubated earlier today with some stridor postextubation.  Now having wheezing  eICU Interventions  Add scheduled and as needed nebulizers   2140 -appears to be in substantial distress with nasal cannula in place, has some stridor.  Will initiate BiPAP therapy.  If she fails BiPAP, will need to talk to ground team about potential reintubation  2225 -patient is significantly agitated with BiPAP in place.  Has been ventilation 17-30 L/min.  May benefit from Precedex .    2303 -switch Keppra  to IV.  Chest radiograph done, unrevealing as expected.  ABG has been ordered, pending  Intervention Category Intermediate Interventions: Respiratory distress - evaluation and management  Bronwyn Belasco 09/29/2023, 8:01 PM

## 2023-09-29 NOTE — Procedures (Signed)
 Extubation Procedure Note  Patient Details:   Name: Lori Gates DOB: February 05, 1956 MRN: [id number]   Airway Documentation:    Vent end date: (not recorded) Vent end time: (not recorded)   Evaluation  O2 sats: stable throughout Complications: No apparent complications Patient did tolerate procedure well. Bilateral Breath Sounds: Clear, Diminished   Yes Pt extubated to 3L [place]. Positive cuff leak noted  Savien Mamula V 09/29/2023, 9:59 AM

## 2023-09-29 NOTE — Progress Notes (Signed)
 NAME:  Lori Gates, MRN:  [id number], DOB:  June 21, 1955, LOS: 5 ADMISSION DATE:  09/24/2023, CONSULTATION DATE:  4/29 REFERRING MD:  Dr. Lincoln Renshaw, CHIEF COMPLAINT:  seizures   History of Present Illness:  68 year old female w/ poorly controlled  IDDM, CKD stage IV, HTN, LLD, HFpEF, substance abuse (cocaine) followed by nephrology in [place].  Hx from ED: Daughter found on floor around 1500 actively seizing, CBG read "high". Continued to seize from time of EMS dispatch to arrival to ER. Pt had received 2.5 mg versed  in route. On arrival had right facial twitching, right gaze pref. Received 2 mg IV ativan . After this gaze pref returned to midline w/ snoring respirations, no further twitching, would grimace to noxious stim.  CT head negative for acute bleed, there was left periorbital facial swelling.  MRI neg for acute process per neuro.  Intubated by EDP for airway protection  Given IV keppra  (total 2 gms)  Initial lab wk: Na 129, glucose 989, anion gap 16, wbc 13, CK 121, trop 114, VBG Ph 7.08, pco2 81, bicarb 24, lactate 2.8-->2.9 Post intubation abg: 7.42/29/130/19.2 PCXR ett good position. 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 Down time not clear CBG "high" seized from EMS notification to arrival to ED> CT and MRI brain neg for acute bleed. Loaded 2 gm keppra . Intubated airway protection   Interim History /  Subjective:  Patient failed expended breathing test yesterday due to apnea Became hypotensive overnight requiring low-dose Levophed  support Currently off vasopressors Tolerating spontaneous breathing trial Remain afebrile Made 550 cc urine This morning she was hyperglycemic  Objective   Blood pressure 138/67, pulse 94, temperature 98.6 F (37 C), temperature source Oral, resp. rate (!) 21, height 5\' 5"  (1.651 m), weight 64.7 kg, SpO2 100%.    Vent Mode: PSV FiO2 (%):  [30 %] 30 % Set Rate:  [24 bmp] 24 bmp Vt Set:  [450 mL] 450 mL PEEP:  [5 cmH20] 5 cmH20 Pressure Support:  [5 cmH20-10 cmH20] 5 cmH20 Plateau Pressure:  [21 cmH20-22 cmH20] 21 cmH20   Intake/Output Summary (Last 24 hours) at 09/29/2023 0931 Last data filed at 09/29/2023 0800 Gross per 24 hour  Intake 1653.84 ml  Output 550 ml  Net 1103.84 ml   Filed Weights   09/27/23 0853 09/28/23 0630 09/29/23 0500  Weight: 64 kg 64 kg 64.7 kg    Examination: General: Crtitically ill-appearing female, orally intubated HEENT: [place]/AT, eyes anicteric.  ETT and OGT in place Neuro:, Following commands, moving all 4 extremities Chest: Coarse breath sounds, no wheezes or rhonchi Heart: Regular rate and rhythm, no murmurs or gallops Abdomen: Soft, nondistended, bowel sounds present Extremities: 3+ pitting edema noted in bilateral lower extremities.  Left upper extremity with AV fistula Skin: No rash  Labs and images reviewed  Resolved Hospital Problem list   Sepsis Hyponatremia, hypophosphatemia Hyperkalemia  Assessment & Plan:  Acute metabolic encephalopathy secondary to DKA Acute  toxic encephalopathy in the setting of cocaine abuse Patient remained off sedation, she is awake and following commands No signs of withdrawal DKA has been corrected  Seizure with post-ictal state Likely in the setting of DKA with blood sugar close to 1000 Continue Keppra  500 mg twice daily, patient may not need Keppra  for long time as likely  her seizures were brought up on by reversible cause like DKA Appreciate neurology follow-up  Acute hypoxic/hypercapnic respiratory failure Right lower lobe pneumonia likely aspiration Continue lung protective ventilation VAP prevention bundle in place Patient is off sedation, tolerating sponge and breathing trial, will try to extubate her Continue IV ceftriaxone  to complete 7 days therapy  DKA with coma Blood sugars are elevated up to 300 Restarted back on low-dose Lantus  Hemoglobin A1c is 14 Continue sliding scale insulin  with CBG goal 140-180  AKI on CKD 4, requiring hemodialysis LUE AVF placed in 2024 Made 550cc urine in last 24-hours Received dialysis on Friday for the first time Serum creatinine is uptrending Closely monitor and supplement electrolytes  Sepsis due to UTI, POA Lactic acidosis, resolved Sepsis resolved Urine culture grew lactobacillus, likely contaminant Continue IV antibiotics with ceftriaxone   Demand cardiac ischemia in the setting of acute infection and seizures Echocardiogram showed no wall motion abnormalities  LUE superficial venous thrombosis Continue warm compress  HTN HLD HFpEF Require vasopressor support overnight, now blood pressure is 150s Decrease amlodipine  to 5 mg Continue statin  Severe protein calorie malnutrition Continue dietary supplements  Best Practice (right click and "Reselect all SmartList Selections" daily)   Diet/type: Tube feeds DVT prophylaxis prophylactic heparin   Pressure ulcer(s): N/A GI prophylaxis: H2B Lines: N/A Foley:  Yes, and it is still needed Code Status:  full code Last date of multidisciplinary goals of care discussion [4/30 updated grandson over phone. States full code. Will update family at bedside today.]   The patient is critically ill due to acute encephalopathy in the setting of DKA/seizures/acute respiratory failure with hypoxia and hypercapnia.  Critical care was necessary to treat or  prevent imminent or life-threatening deterioration.  Critical care was time spent personally by me on the following activities: development of treatment plan with patient and/or surrogate as well as nursing, discussions with consultants, evaluation of patient's response to treatment, examination of patient, obtaining history from patient or surrogate, ordering and performing treatments and interventions, ordering and review of laboratory studies, ordering and review of radiographic studies, pulse oximetry, re-evaluation of patient's condition and participation in multidisciplinary rounds.   During this encounter critical care time was devoted to patient care services described in this note for 35 minutes.    Trevor Fudge, MD [facility] See Amion for pager If no response to pager, please call [phone] until 7pm After 7pm, Please call E-link [phone]

## 2023-09-29 NOTE — Plan of Care (Signed)
 When the patient is fully awake and alert we can consider a gradual tapering of her Keppra  towards the goal of discontinuation. Please call Neurology at that time.   Electronically signed: Dr. Gresia Isidoro

## 2023-09-29 NOTE — Evaluation (Signed)
 Clinical/Bedside Swallow Evaluation Patient Details  Name: Lori Gates MRN: [id number] Date of Birth: 1955/11/06  Today's Date: 09/29/2023 Time: SLP Start Time (ACUTE ONLY): 1426 SLP Stop Time (ACUTE ONLY): 1439 SLP Time Calculation (min) (ACUTE ONLY): 13 min  Past Medical History:  Past Medical History:  Diagnosis Date   Asthma    Diabetes mellitus without complication (HCC)    Hypertension    Past Surgical History:  Past Surgical History:  Procedure Laterality Date   TUBAL LIGATION     HPI:  Lori Gates is a 68 year old female who presented to ED after daughter found on floor actively seizing, CBG read "high". Continued to seize from time of EMS dispatch to arrival to ER, and was intubated for airway protection.  ETT 4/29-5/4.  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 "Stable right basilar atelectasis or infiltrate is noted." Pt with poorly controlled IDDM, CKD stage IV, HTN, LLD, HFpEF, substance abuse (cocaine) followed by nephrology in [place].    Assessment / Plan / Recommendation  Clinical Impression  Pt presents with a moderate oral dysphagia suspected to be 2/2 AMS and clinical indicators of pharyngeal dysphagia.  Pt with labored, noisy/stridorous, mildly tachypneic breathing on arrival.  RR 22-27.  Vocal quality is largely aphonic, but some phonation acheived.  Cough is weak, wet, and lacks crispness.  SLP provided oral care prior to administration of PO trials.  Pt is edentulous.  Her dentures are not present, but family states she sometimes wears them for PO intake. Pt eager for POs but she did not swallow small amounts of water given by spoon.  There was anterior spillage x1 and oral holding of 2 additional trials.  Suspect inadvertant inhalation of portion of bolus with increased wet vocal quality.  SLP used suction to clear. Pt appears to be having  trouble with secretion management.  There was frequent weak, wet cough in absence of bolus trials.  SLP used suction to assist with cough clearance.  Given that pt did not exhibit reflexive swallow today, recommend strict NPO.  SLP will follow for PO readiness with hopeful improvement with additional time post extubation. Pt without AMN at this time.    Recommend pt remain NPO with alternate means of medication administration.   SLP Visit Diagnosis: Dysphagia, unspecified (R13.10)    Aspiration Risk  Severe aspiration risk    Diet Recommendation NPO    Medication Administration: Via alternative means    Other  Recommendations Oral Care Recommendations: Oral care QID    Recommendations for follow up therapy are one component of a multi-disciplinary discharge planning process, led by the attending physician.  Recommendations may be updated based on patient status, additional functional criteria and insurance authorization.  Follow up Recommendations  (TBD, continue ST at next level of care at present)      Assistance Recommended at Discharge  N/A  Functional Status Assessment Patient has had a recent decline in their functional status and demonstrates the ability to make significant improvements in function in a reasonable and predictable amount of time.  Frequency and Duration min 2x/week  2 weeks       Prognosis Prognosis for improved oropharyngeal function: Good (with continued recovery)      Swallow Study   General Date of Onset: 09/24/23 HPI: Lori Gates is a 68 year old female who presented to ED after daughter found on floor  actively seizing, CBG read "high". Continued to seize from time of EMS dispatch to arrival to ER, and was intubated for airway protection.  ETT 4/29-5/4.  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 "Stable right basilar atelectasis or  infiltrate is noted." Pt with poorly controlled IDDM, CKD stage IV, HTN, LLD, HFpEF, substance abuse (cocaine) followed by nephrology in [place]. Type of Study: Bedside Swallow Evaluation Previous Swallow Assessment: None Diet Prior to this Study: NPO Temperature Spikes Noted: No Respiratory Status: Nasal cannula History of Recent Intubation: Yes Total duration of intubation (days): 6 days Date extubated: 09/29/23 Behavior/Cognition: Alert;Cooperative Oral Cavity Assessment: Within Functional Limits Oral Care Completed by SLP: Yes Oral Cavity - Dentition: Edentulous;Dentures, not available Self-Feeding Abilities: Total assist Patient Positioning: Upright in bed Baseline Vocal Quality: Aphonic Volitional Cough: Weak Volitional Swallow: Unable to elicit    Oral/Motor/Sensory Function Overall Oral Motor/Sensory Function: Mild impairment Facial ROM: Within Functional Limits Facial Symmetry: Within Functional Limits Lingual ROM: Within Functional Limits Lingual Symmetry: Within Functional Limits Lingual Strength: Reduced Velum: Within Functional Limits Mandible: Within Functional Limits   Ice Chips Ice chips: Not tested   Thin Liquid Thin Liquid: Impaired Presentation: Spoon Oral Phase Impairments: Reduced labial seal Oral Phase Functional Implications: Right anterior spillage;Oral holding Pharyngeal  Phase Impairments:  (Absent swallow)    Nectar Thick Nectar Thick Liquid: Not tested   Honey Thick Honey Thick Liquid: Not tested   Puree Puree: Not tested   Solid     Solid: Not tested      Elester Grim, MA, CCC-SLP Acute Rehabilitation Services Office: [phone] 09/29/2023,3:13 PM

## 2023-09-29 NOTE — Progress Notes (Signed)
 Lori Gates is an 68 y.o. female  w/ IDDM, HTN, CKD4 found down on the floor by her daughter seizing. CGB read "high". EMS gave versed  and ED at [facility] gave Ativan  2mg  IV. CT head neg and MRI head was negative. Pt was intubated for airway protection. Drug screen was + for cocaine and BZD's. Pt was rx'd w/ IV insulin  protocol for HHS vs DKA. Pt was admitted to ICU at [facility]. Initial BS's were > 600, BUN 18, creat 2.73 , eGFR 18 ml/min. Pt had fevers to 101. Pressors have not been needed. UOP is poor w/ 90 cc yesterday and 60 cc today. CXR showing progressive RLL infiltrate. No edema.  Not on dialysis yet but recently put a fistula in her left arm about 1 month ago.  She was living in [place]  but recently moved to [place], [place].  Assessment/Plan:  CKD 4 - b/l creatinine is 2.5- 2.8 from mid-late 2024, eGFR 17- 21 ml/min. Creat here is 2.6- 2.7 in baseline range. AVF was placed in L upper ext per the family, but the patient is not on dialysis. Not sure who follows her at this time, she recently moved from [place] to [place]. D/W daughter Lori Gates about need to start hd and she agreed on 5/1. +8.3L - Appreciate temp catheter by CCM. - Tolerated dialysis early 5/2; continue holding HD today. ~564mL UOP /24hrs past 2 days. Renal function appears to have stabilized.   -Monitor Daily I/Os, Daily weight  -Maintain MAP>65 for optimal renal perfusion.  - Avoid nephrotoxic agents such as IV contrast, NSAIDs, and phosphate containing bowel preps (FLEETS)  Volume: sig bilat LE edema, some UE edema as well. CXR is clear.  Seizures: per neurology, treat metabolic derangements Drug abuse: cocaine and BZD's were+ on drug screen DKA w/ coma: per CCM UTI/ sepsis: w/ borderline hypotension, on IV rocephin  Acute hypoxic resp failure - on vent LUE AVF: suspected AVF, good bruit, per family was placed/ created about 1 month ago when she was in [place]  Subjective: Intubated for airway  protection,  not on pressors -> extubated this AM; hungry and asking for food.   Chemistry and CBC: Creatinine, Ser  Date/Time Value Ref Range Status  09/29/2023 05:05 AM 2.34 (H) 0.44 - 1.00 mg/dL Final  98/03/9146 82:95 AM 1.92 (H) 0.44 - 1.00 mg/dL Final  62/13/0865 78:46 AM 2.89 (H) 0.44 - 1.00 mg/dL Final  96/29/5284 13:24 AM 2.90 (H) 0.44 - 1.00 mg/dL Final  40/02/2724 36:64 PM 2.81 (H) 0.44 - 1.00 mg/dL Final  40/34/7425 95:63 PM 2.72 (H) 0.44 - 1.00 mg/dL Final  87/56/4332 95:18 PM 2.77 (H) 0.44 - 1.00 mg/dL Final  84/16/6063 01:60 AM 2.73 (H) 0.44 - 1.00 mg/dL Final  10/93/2355 73:22 AM 2.66 (H) 0.44 - 1.00 mg/dL Final  02/54/2706 23:76 PM 2.58 (H) 0.44 - 1.00 mg/dL Final  28/31/5176 16:07 PM 2.64 (H) 0.44 - 1.00 mg/dL Final  37/02/6268 48:54 PM 1.09 (H) 0.44 - 1.00 mg/dL Final  62/70/3500 93:81 PM 0.99 0.50 - 1.10 mg/dL Final   Recent Labs  Lab 09/25/23 1626 09/25/23 1931 09/25/23 2317 09/26/23 0622 09/27/23 0600 09/28/23 0721 09/29/23 0505  NA 138 139 140 138 140 138 139  K 4.1 4.4 5.0 5.7* 4.1 3.0* 4.1  CL 104 104 105 106 106 107 99  CO2 19* 21* 22 21* 24 22 23   GLUCOSE 165* 152* 138* 132* 173* 44* 318*  BUN 19 19 20 21  29* 23 35*  CREATININE  2.77* 2.72* 2.81* 2.90* 2.89* 1.92* 2.34*  CALCIUM  8.4* 8.4* 8.4* 8.2* 7.9* 7.9* 8.1*  PHOS  --   --   --   --  2.3* 2.2* 4.2   Recent Labs  Lab 09/24/23 1534 09/25/23 0218 09/25/23 0447 09/25/23 0955 09/27/23 0600 09/28/23 0721  WBC 13.0*  --  8.6 6.2 10.9* 5.9  NEUTROABS 11.4*  --  6.3  --   --   --   HGB 12.0   < > 12.7 11.3* 8.8* 9.7*  HCT 38.7   < > 38.2 32.8* 26.5* 28.9*  MCV 100.0  --  94.3 93.2 95.3 92.6  PLT 277  --  235 138* 160 161   < > = values in this interval not displayed.   Liver Function Tests: Recent Labs  Lab 09/24/23 1534  AST 19  ALT 12  ALKPHOS 136*  BILITOT 0.6  PROT 7.6  ALBUMIN 3.6   No results for input(s): "LIPASE", "AMYLASE" in the last 168 hours. Recent Labs  Lab  09/25/23 0447  AMMONIA 28   Cardiac Enzymes: Recent Labs  Lab 09/24/23 1534  CKTOTAL 121   Iron Studies: No results for input(s): "IRON", "TIBC", "TRANSFERRIN", "FERRITIN" in the last 72 hours. PT/INR: @LABRCNTIP (inr:5)  Xrays/Other Studies: ) Results for orders placed or performed during the hospital encounter of 09/24/23 (from the past 48 hours)  Glucose, capillary     Status: Abnormal   Collection Time: 09/27/23 12:06 PM  Result Value Ref Range   Glucose-Capillary 144 (H) 70 - 99 mg/dL    Comment: Glucose reference range applies only to samples taken after fasting for at least 8 hours.  Glucose, capillary     Status: Abnormal   Collection Time: 09/27/23  3:22 PM  Result Value Ref Range   Glucose-Capillary 144 (H) 70 - 99 mg/dL    Comment: Glucose reference range applies only to samples taken after fasting for at least 8 hours.  Glucose, capillary     Status: Abnormal   Collection Time: 09/27/23  7:18 PM  Result Value Ref Range   Glucose-Capillary 122 (H) 70 - 99 mg/dL    Comment: Glucose reference range applies only to samples taken after fasting for at least 8 hours.  Glucose, capillary     Status: Abnormal   Collection Time: 09/27/23 11:07 PM  Result Value Ref Range   Glucose-Capillary 107 (H) 70 - 99 mg/dL    Comment: Glucose reference range applies only to samples taken after fasting for at least 8 hours.  Glucose, capillary     Status: None   Collection Time: 09/28/23  3:06 AM  Result Value Ref Range   Glucose-Capillary 78 70 - 99 mg/dL    Comment: Glucose reference range applies only to samples taken after fasting for at least 8 hours.  Lactic acid, plasma     Status: None   Collection Time: 09/28/23  7:21 AM  Result Value Ref Range   Lactic Acid, Venous 1.1 0.5 - 1.9 mmol/L    Comment: Performed at [facility], [address] [place], [place] [address]  Basic metabolic panel     Status: Abnormal   Collection Time: 09/28/23  7:21 AM  Result Value Ref  Range   Sodium 138 135 - 145 mmol/L   Potassium 3.0 (L) 3.5 - 5.1 mmol/L   Chloride 107 98 - 111 mmol/L   CO2 22 22 - 32 mmol/L   Glucose, Bld 44 (LL) 70 - 99 mg/dL  Comment: CRITICAL RESULT CALLED TO, READ BACK BY AND VERIFIED WITH CHAN.T RN @0814  09/28/23 S OUROBANGNA Glucose reference range applies only to samples taken after fasting for at least 8 hours.    BUN 23 8 - 23 mg/dL   Creatinine, Ser 1.61 (H) 0.44 - 1.00 mg/dL   Calcium  7.9 (L) 8.9 - 10.3 mg/dL   GFR, Estimated 28 (L) >60 mL/min    Comment: (NOTE) Calculated using the CKD-EPI Creatinine Equation (2021)    Anion gap 9 5 - 15    Comment: Performed at [facility], [address] [place], [place] [address]  Magnesium      Status: None   Collection Time: 09/28/23  7:21 AM  Result Value Ref Range   Magnesium  2.1 1.7 - 2.4 mg/dL    Comment: Performed at [facility], [address], [place], [place] [address]  Phosphorus     Status: Abnormal   Collection Time: 09/28/23  7:21 AM  Result Value Ref Range   Phosphorus 2.2 (L) 2.5 - 4.6 mg/dL    Comment: Performed at [facility], [address] [place], [place] [address]  CBC     Status: Abnormal   Collection Time: 09/28/23  7:21 AM  Result Value Ref Range   WBC 5.9 4.0 - 10.5 K/uL   RBC 3.12 (L) 3.87 - 5.11 MIL/uL   Hemoglobin 9.7 (L) 12.0 - 15.0 g/dL   HCT 78.2 (L) 95.6 - 21.3 %   MCV 92.6 80.0 - 100.0 fL   MCH 31.1 26.0 - 34.0 pg   MCHC 33.6 30.0 - 36.0 g/dL   RDW 08.6 57.8 - 46.9 %   Platelets 161 150 - 400 K/uL   nRBC 0.0 0.0 - 0.2 %    Comment: Performed at [facility], [address], [place], [place] [address]  Glucose, capillary     Status: Abnormal   Collection Time: 09/28/23  8:05 AM  Result Value Ref Range   Glucose-Capillary 33 (LL) 70 - 99 mg/dL    Comment: Glucose reference range applies only to samples taken after fasting for at least 8 hours.   Comment 1 Notify RN   Glucose, capillary     Status: Abnormal   Collection  Time: 09/28/23  8:24 AM  Result Value Ref Range   Glucose-Capillary 175 (H) 70 - 99 mg/dL    Comment: Glucose reference range applies only to samples taken after fasting for at least 8 hours.  Glucose, capillary     Status: Abnormal   Collection Time: 09/28/23 11:18 AM  Result Value Ref Range   Glucose-Capillary 60 (L) 70 - 99 mg/dL    Comment: Glucose reference range applies only to samples taken after fasting for at least 8 hours.  Glucose, capillary     Status: Abnormal   Collection Time: 09/28/23 12:28 PM  Result Value Ref Range   Glucose-Capillary 207 (H) 70 - 99 mg/dL    Comment: Glucose reference range applies only to samples taken after fasting for at least 8 hours.  Glucose, capillary     Status: Abnormal   Collection Time: 09/28/23  3:57 PM  Result Value Ref Range   Glucose-Capillary 140 (H) 70 - 99 mg/dL    Comment: Glucose reference range applies only to samples taken after fasting for at least 8 hours.  Glucose, capillary     Status: Abnormal   Collection Time: 09/28/23  7:10 PM  Result Value Ref Range  Glucose-Capillary 179 (H) 70 - 99 mg/dL    Comment: Glucose reference range applies only to samples taken after fasting for at least 8 hours.  Glucose, capillary     Status: Abnormal   Collection Time: 09/28/23 11:20 PM  Result Value Ref Range   Glucose-Capillary 304 (H) 70 - 99 mg/dL    Comment: Glucose reference range applies only to samples taken after fasting for at least 8 hours.  Glucose, capillary     Status: Abnormal   Collection Time: 09/29/23  3:05 AM  Result Value Ref Range   Glucose-Capillary 279 (H) 70 - 99 mg/dL    Comment: Glucose reference range applies only to samples taken after fasting for at least 8 hours.  Basic metabolic panel     Status: Abnormal   Collection Time: 09/29/23  5:05 AM  Result Value Ref Range   Sodium 139 135 - 145 mmol/L   Potassium 4.1 3.5 - 5.1 mmol/L   Chloride 99 98 - 111 mmol/L   CO2 23 22 - 32 mmol/L   Glucose, Bld 318  (H) 70 - 99 mg/dL    Comment: Glucose reference range applies only to samples taken after fasting for at least 8 hours.   BUN 35 (H) 8 - 23 mg/dL   Creatinine, Ser 2.95 (H) 0.44 - 1.00 mg/dL   Calcium  8.1 (L) 8.9 - 10.3 mg/dL   GFR, Estimated 22 (L) >60 mL/min    Comment: (NOTE) Calculated using the CKD-EPI Creatinine Equation (2021)    Anion gap 17 (H) 5 - 15    Comment: Performed at [facility], [address] [place], [place] [address]  Magnesium      Status: None   Collection Time: 09/29/23  5:05 AM  Result Value Ref Range   Magnesium  2.2 1.7 - 2.4 mg/dL    Comment: Performed at [facility], [address], [place], [place] [address]  Phosphorus     Status: None   Collection Time: 09/29/23  5:05 AM  Result Value Ref Range   Phosphorus 4.2 2.5 - 4.6 mg/dL    Comment: Performed at [facility], [address], [place], [place] [address]  Glucose, capillary     Status: Abnormal   Collection Time: 09/29/23  7:37 AM  Result Value Ref Range   Glucose-Capillary 252 (H) 70 - 99 mg/dL    Comment: Glucose reference range applies only to samples taken after fasting for at least 8 hours.   ECHOCARDIOGRAM COMPLETE Result Date: 09/27/2023    ECHOCARDIOGRAM REPORT   Patient Name:   Lori Gates Date of Exam: 09/27/2023 Medical Rec #:  [id number]        Height:       65.0 in Accession #:    [id number]       Weight:       141.1 lb Date of Birth:  [birth year]       BSA:          1.706 m Patient Age:    67 years         BP:           152/63 mmHg Patient Gender: F                HR:           82 bpm. Exam Location:  Inpatient Procedure: 2D Echo, 3D Echo, Cardiac Doppler, Color Doppler and Strain Analysis            (  Both Spectral and Color Flow Doppler were utilized during            procedure). Indications:    CHF-acute diastolic  History:        Patient has no prior history of Echocardiogram examinations.  Sonographer:    Juanita Shaw Referring Phys: [id number] SUDHAM CHAND IMPRESSIONS   1. Left ventricular ejection fraction, by estimation, is 65 to 70%. Left ventricular ejection fraction by 3D volume is 60 %. The left ventricle has normal function. The left ventricle has no regional wall motion abnormalities. There is moderate concentric left ventricular hypertrophy. Left ventricular diastolic parameters are indeterminate. The average left ventricular global longitudinal strain is -21.2 %. The global longitudinal strain is normal.  2. Right ventricular systolic function is normal. The right ventricular size is normal. There is mildly elevated pulmonary artery systolic pressure.  3. Left atrial size was mild to moderately dilated.  4. The mitral valve is degenerative. Mild mitral valve regurgitation. No evidence of mitral stenosis.  5. The aortic valve is normal in structure. There is mild calcification of the aortic valve. Aortic valve regurgitation is mild. No aortic stenosis is present.  6. The inferior vena cava is normal in size with greater than 50% respiratory variability, suggesting right atrial pressure of 3 mmHg. FINDINGS  Left Ventricle: Left ventricular ejection fraction, by estimation, is 65 to 70%. Left ventricular ejection fraction by 3D volume is 60 %. The left ventricle has normal function. The left ventricle has no regional wall motion abnormalities. The average left ventricular global longitudinal strain is -21.2 %. Strain was performed and the global longitudinal strain is normal. The left ventricular internal cavity size was normal in size. There is moderate concentric left ventricular hypertrophy. Left ventricular diastolic parameters are indeterminate. Right Ventricle: The right ventricular size is normal. No increase in right ventricular wall thickness. Right ventricular systolic function is normal. There is mildly elevated pulmonary artery systolic pressure. The tricuspid regurgitant velocity is 3.02  m/s, and with an assumed right atrial pressure of 8 mmHg, the estimated  right ventricular systolic pressure is 44.5 mmHg. Left Atrium: Left atrial size was mild to moderately dilated. Right Atrium: Right atrial size was normal in size. Pericardium: There is no evidence of pericardial effusion. Mitral Valve: The mitral valve is degenerative in appearance. Mild mitral annular calcification. Mild mitral valve regurgitation. No evidence of mitral valve stenosis. MV peak gradient, 3.7 mmHg. The mean mitral valve gradient is 2.0 mmHg. Tricuspid Valve: The tricuspid valve is normal in structure. Tricuspid valve regurgitation is mild . No evidence of tricuspid stenosis. Aortic Valve: The aortic valve is normal in structure. There is mild calcification of the aortic valve. Aortic valve regurgitation is mild. No aortic stenosis is present. Aortic valve mean gradient measures 2.0 mmHg. Aortic valve peak gradient measures 3.5 mmHg. Aortic valve area, by VTI measures 4.76 cm. Pulmonic Valve: The pulmonic valve was normal in structure. Pulmonic valve regurgitation is trivial. No evidence of pulmonic stenosis. Aorta: The aortic root is normal in size and structure. Venous: The inferior vena cava is normal in size with greater than 50% respiratory variability, suggesting right atrial pressure of 3 mmHg. IAS/Shunts: No atrial level shunt detected by color flow Doppler. Additional Comments: 3D was performed not requiring image post processing on an independent workstation and was normal.  LEFT VENTRICLE PLAX 2D LVIDd:         3.00 cm         Diastology LVIDs:  2.00 cm         LV e' medial:    4.82 cm/s LV PW:         0.80 cm         LV E/e' medial:  18.2 LV IVS:        1.20 cm         LV e' lateral:   8.59 cm/s LVOT diam:     2.10 cm         LV E/e' lateral: 10.2 LV SV:         72 LV SV Index:   42              2D Longitudinal LVOT Area:     3.46 cm        Strain                                2D Strain GLS   -21.2 %                                Avg: LV Volumes (MOD) LV vol d, MOD    89.1 ml        3D Volume EF A2C:                           LV 3D EF:    Left LV vol d, MOD    93.6 ml                    ventricul A4C:                                        ar LV vol s, MOD    32.9 ml                    ejection A2C:                                        fraction LV vol s, MOD    32.3 ml                    by 3D A4C:                                        volume is LV SV MOD A2C:   56.2 ml                    60 %. LV SV MOD A4C:   93.6 ml LV SV MOD BP:    64.6 ml                                3D Volume EF:                                3D EF:        60 %  LV EDV:       122 ml                                LV ESV:       49 ml                                LV SV:        73 ml RIGHT VENTRICLE             IVC RV Basal diam:  3.50 cm     IVC diam: 1.60 cm RV Mid diam:    2.30 cm RV S prime:     14.40 cm/s TAPSE (M-mode): 2.5 cm LEFT ATRIUM           Index        RIGHT ATRIUM           Index LA diam:      3.20 cm 1.88 cm/m   RA Area:     12.40 cm LA Vol (A2C): 52.9 ml 31.02 ml/m  RA Volume:   28.30 ml  16.59 ml/m LA Vol (A4C): 53.6 ml 31.43 ml/m  AORTIC VALVE                    PULMONIC VALVE AV Area (Vmax):    3.75 cm     PV Vmax:       0.61 m/s AV Area (Vmean):   3.70 cm     PV Peak grad:  1.5 mmHg AV Area (VTI):     4.76 cm AV Vmax:           94.10 cm/s AV Vmean:          65.600 cm/s AV VTI:            0.152 m AV Peak Grad:      3.5 mmHg AV Mean Grad:      2.0 mmHg LVOT Vmax:         102.00 cm/s LVOT Vmean:        70.100 cm/s LVOT VTI:          0.209 m LVOT/AV VTI ratio: 1.38  AORTA Ao Root diam: 3.30 cm Ao Asc diam:  3.20 cm MITRAL VALVE               TRICUSPID VALVE MV Area (PHT): 4.63 cm    TR Peak grad:   36.5 mmHg MV Area VTI:   4.52 cm    TR Vmax:        302.00 cm/s MV Peak grad:  3.7 mmHg MV Mean grad:  2.0 mmHg    SHUNTS MV Vmax:       0.96 m/s    Systemic VTI:  0.21 m MV Vmean:      60.0 cm/s   Systemic Diam: 2.10 cm MV Decel Time: 164 msec MV E velocity:  87.80 cm/s MV A velocity: 76.40 cm/s MV E/A ratio:  1.15 Jules Oar MD Electronically signed by Jules Oar MD Signature Date/Time: 09/27/2023/11:34:55 AM    Final     PMH:   Past Medical History:  Diagnosis Date   Asthma    Diabetes mellitus without complication (HCC)    Hypertension     PSH:   Past Surgical History:  Procedure Laterality Date   TUBAL LIGATION  Allergies: No Known Allergies  Medications:   Prior to Admission medications   Medication Sig Start Date End Date Taking? Authorizing Provider  albuterol -ipratropium (COMBIVENT) 18-103 MCG/ACT inhaler Inhale 2 puffs into the lungs every 6 (six) hours as needed. For shortness of breath    [provider]  amLODipine  (NORVASC ) 5 MG tablet Take 5 mg by mouth daily.    [provider]  atorvastatin  (LIPITOR ) 80 MG tablet Take 80 mg by mouth daily.    [provider]  ibuprofen (ADVIL,MOTRIN) 200 MG tablet Take 400 mg by mouth every 6 (six) hours as needed. For pain    [provider]  insulin  aspart protamine- aspart (NOVOLOG  MIX 70/30) (70-30) 100 UNIT/ML injection Inject 0.15 mLs (15 Units total) into the skin daily with breakfast. 05/27/21   Darletta Ehrich, PA-C  insulin  aspart protamine- aspart (NOVOLOG  MIX 70/30) (70-30) 100 UNIT/ML injection Inject 0.15 mLs (15 Units total) into the skin daily with breakfast. 05/28/21   Wynetta Heckle, MD  insulin  lispro (HUMALOG) 100 UNIT/ML KwikPen Inject 13 Units into the skin 3 (three) times daily. 07/14/23   [provider]  LANTUS  SOLOSTAR 100 UNIT/ML Solostar Pen Inject 20 Units into the skin at bedtime.    [provider]  nebivolol (BYSTOLIC) 2.5 MG tablet Take 2.5 mg by mouth daily. 07/14/23   [provider]  ondansetron  (ZOFRAN ) 4 MG tablet Take 1 tablet (4 mg total) by mouth every 8 (eight) hours as needed for nausea. 05/06/12   Pisciotta, Peterson Brandt, PA-C  oseltamivir  (TAMIFLU ) 75 MG capsule Take 1 capsule  (75 mg total) by mouth every 12 (twelve) hours. 05/06/12   Pisciotta, Peterson Brandt, PA-C  RYBELSUS 3 MG TABS Take 2 tablets by mouth daily.    [provider]  thiamine  (VITAMIN B-1) 100 MG tablet Take 100 mg by mouth daily.    [provider]  torsemide (DEMADEX) 20 MG tablet Take 20 mg by mouth daily.    [provider]    Discontinued Meds:   Medications Discontinued During This Encounter  Medication Reason   insulin  glargine (LANTUS ) 100 UNIT/ML injection Dose change   amLODipine  (NORVASC ) 10 MG tablet Dose change   propofol  (DIPRIVAN ) 10 mg/mL bolus/IV push Returned to ADS   lactated ringers  bolus 1,000 mL    levETIRAcetam  (KEPPRA ) 1000 MG/100ML IVPB    levETIRAcetam  (KEPPRA ) IVPB 1000 mg/100 mL premix    propofol  (DIPRIVAN ) 1000 MG/100ML infusion    famotidine  (PEPCID ) tablet 20 mg Duplicate   Chlorhexidine  Gluconate Cloth 2 % PADS 6 each    famotidine  (PEPCID ) tablet 20 mg    potassium chloride  SA (KLOR-CON  M) CR tablet 40 mEq    propofol  (DIPRIVAN ) 1000 MG/100ML infusion    insulin  regular, human (MYXREDLIN ) 100 units/ 100 mL infusion    docusate (COLACE) 50 MG/5ML liquid 100 mg    norepinephrine  (LEVOPHED ) 4mg  in (0.016 mg/mL) premix infusion    insulin  glargine-yfgn (SEMGLEE ) injection 10 Units    fentaNYL  (SUBLIMAZE ) injection 25 mcg    fentaNYL  in NS 250mL (10mcg/ml) infusion-PREMIX    fentaNYL  (SUBLIMAZE ) bolus via infusion 25-100 mcg    dexmedetomidine  (PRECEDEX ) 400 MCG/100ML (4 mcg/mL) infusion    levETIRAcetam  (KEPPRA ) IVPB 500 mg/100 mL premix P&T Policy: IV to PO Conversion   insulin  glargine-yfgn (SEMGLEE ) injection 8 Units    norepinephrine  (LEVOPHED ) 4mg  in (0.016 mg/mL) premix infusion    fentaNYL  (SUBLIMAZE ) injection 50 mcg    amLODipine  (NORVASC ) tablet 10 mg  insulin  aspart protamine- aspart (NOVOLOG  MIX 70/30) (70-30) 100 UNIT/ML injection Change in therapy   insulin  aspart protamine- aspart (NOVOLOG  MIX 70/30)  (70-30) 100 UNIT/ML injection Change in therapy   oseltamivir  (TAMIFLU ) 75 MG capsule Completed Course   docusate (COLACE) 50 MG/5ML liquid 100 mg Inpatient Standard   feeding supplement (PROSource TF20) liquid 60 mL Inpatient Standard   atorvastatin  (LIPITOR ) tablet 80 mg    thiamine  (VITAMIN B1) tablet 100 mg    polyethylene glycol (MIRALAX  / GLYCOLAX ) packet 17 g    polyethylene glycol (MIRALAX  / GLYCOLAX ) packet 17 g    acetaminophen  (TYLENOL ) tablet 650 mg    famotidine  (PEPCID ) tablet 10 mg    levETIRAcetam  (KEPPRA ) tablet 500 mg    amLODipine  (NORVASC ) tablet 5 mg    Oral care mouth rinse    Oral care mouth rinse     Social History:  reports that she has quit smoking. Her smoking use included cigarettes. She does not have any smokeless tobacco history on file. She reports that she does not drink alcohol. No history on file for drug use.  Family History:  History reviewed. No pertinent family history.  Blood pressure (!) 167/80, pulse (!) 104, temperature 98.6 F (37 C), temperature source Oral, resp. rate 20, height 5\' 5"  (1.651 m), weight 64.7 kg, SpO2 96%. Physical Exam: Gen extubated 5/3 No rash, cyanosis or gangrene Sclera anicteric, throat w/ ETT No jvd or bruits Chest clear anterior/ lateral RRR no MRG Abd soft ntnd no mass or ascites +bs GU purewick MS no joint effusions or deformity Ext diffuse 1-2+ bilat LE and LUE edema Neuro is on vent but moving extremities LUA BCF w/ + bruit but ipsilateral swelling, RIJ temp     Javione Gunawan, Alveda Aures, MD 09/29/2023, 11:02 AM

## 2023-09-30 DIAGNOSIS — R569 Unspecified convulsions: Secondary | ICD-10-CM | POA: Diagnosis not present

## 2023-09-30 DIAGNOSIS — E1111 Type 2 diabetes mellitus with ketoacidosis with coma: Secondary | ICD-10-CM | POA: Diagnosis not present

## 2023-09-30 DIAGNOSIS — G928 Other toxic encephalopathy: Secondary | ICD-10-CM | POA: Diagnosis not present

## 2023-09-30 DIAGNOSIS — J9601 Acute respiratory failure with hypoxia: Secondary | ICD-10-CM | POA: Diagnosis not present

## 2023-09-30 LAB — CBC WITH DIFFERENTIAL/PLATELET
Abs Immature Granulocytes: 0 10*3/uL (ref 0.00–0.07)
Basophils Absolute: 0 10*3/uL (ref 0.0–0.1)
Basophils Relative: 1 %
Eosinophils Absolute: 0 10*3/uL (ref 0.0–0.5)
Eosinophils Relative: 1 %
HCT: 26.7 % — ABNORMAL LOW (ref 36.0–46.0)
Hemoglobin: 8.5 g/dL — ABNORMAL LOW (ref 12.0–15.0)
Lymphocytes Relative: 21 %
Lymphs Abs: 1 10*3/uL (ref 0.7–4.0)
MCH: 30.5 pg (ref 26.0–34.0)
MCHC: 31.8 g/dL (ref 30.0–36.0)
MCV: 95.7 fL (ref 80.0–100.0)
Monocytes Absolute: 0.2 10*3/uL (ref 0.1–1.0)
Monocytes Relative: 4 %
Neutro Abs: 3.5 10*3/uL (ref 1.7–7.7)
Neutrophils Relative %: 73 %
Platelets: 188 10*3/uL (ref 150–400)
RBC: 2.79 MIL/uL — ABNORMAL LOW (ref 3.87–5.11)
RDW: 12.3 % (ref 11.5–15.5)
WBC: 4.8 10*3/uL (ref 4.0–10.5)
nRBC: 0 % (ref 0.0–0.2)
nRBC: 0 /100{WBCs}

## 2023-09-30 LAB — GLUCOSE, CAPILLARY
Glucose-Capillary: 116 mg/dL — ABNORMAL HIGH (ref 70–99)
Glucose-Capillary: 151 mg/dL — ABNORMAL HIGH (ref 70–99)
Glucose-Capillary: 208 mg/dL — ABNORMAL HIGH (ref 70–99)
Glucose-Capillary: 209 mg/dL — ABNORMAL HIGH (ref 70–99)
Glucose-Capillary: 80 mg/dL (ref 70–99)
Glucose-Capillary: 88 mg/dL (ref 70–99)

## 2023-09-30 LAB — BASIC METABOLIC PANEL WITH GFR
Anion gap: 11 (ref 5–15)
BUN: 34 mg/dL — ABNORMAL HIGH (ref 8–23)
CO2: 23 mmol/L (ref 22–32)
Calcium: 8 mg/dL — ABNORMAL LOW (ref 8.9–10.3)
Chloride: 106 mmol/L (ref 98–111)
Creatinine, Ser: 2.23 mg/dL — ABNORMAL HIGH (ref 0.44–1.00)
GFR, Estimated: 23 mL/min — ABNORMAL LOW (ref >60)
Glucose, Bld: 107 mg/dL — ABNORMAL HIGH (ref 70–99)
Potassium: 3.9 mmol/L (ref 3.5–5.1)
Sodium: 140 mmol/L (ref 135–145)

## 2023-09-30 LAB — PHOSPHORUS: Phosphorus: 4.3 mg/dL (ref 2.5–4.6)

## 2023-09-30 LAB — MAGNESIUM: Magnesium: 2.1 mg/dL (ref 1.7–2.4)

## 2023-09-30 MED ORDER — AMLODIPINE BESYLATE 5 MG PO TABS
5.0000 mg | ORAL_TABLET | Freq: Every day | ORAL | Status: DC
  Administered 2023-09-30: 5 mg

## 2023-09-30 MED ORDER — ACETAMINOPHEN 325 MG PO TABS: 650.0000 mg | ORAL_TABLET | ORAL | Status: DC | PRN

## 2023-09-30 MED ORDER — PROSOURCE TF20 ENFIT COMPATIBL EN LIQD
60.0000 mL | Freq: Every day | ENTERAL | Status: DC
  Administered 2023-09-30 – 2023-10-03 (×3): 60 mL
  Filled 2023-09-30 (×3): qty 60

## 2023-09-30 MED ORDER — ATORVASTATIN CALCIUM 80 MG PO TABS
80.0000 mg | ORAL_TABLET | Freq: Every day | ORAL | Status: DC
  Administered 2023-09-30 – 2023-10-03 (×3): 80 mg
  Filled 2023-09-30 (×2): qty 1

## 2023-09-30 MED ORDER — DOCUSATE SODIUM 50 MG/5ML PO LIQD: 100.0000 mg | Freq: Two times a day (BID) | ORAL | Status: DC | PRN

## 2023-09-30 MED ORDER — LEVETIRACETAM 500 MG PO TABS
500.0000 mg | ORAL_TABLET | Freq: Two times a day (BID) | ORAL | Status: DC
  Administered 2023-09-30 – 2023-10-03 (×7): 500 mg
  Filled 2023-09-30 (×6): qty 1

## 2023-09-30 MED ORDER — POLYETHYLENE GLYCOL 3350 17 G PO PACK: 17.0000 g | PACK | Freq: Every day | ORAL | Status: DC | PRN

## 2023-09-30 MED ORDER — THIAMINE MONONITRATE 100 MG PO TABS
100.0000 mg | ORAL_TABLET | Freq: Every day | ORAL | Status: DC
  Administered 2023-09-30 – 2023-10-03 (×3): 100 mg
  Filled 2023-09-30 (×2): qty 1

## 2023-09-30 MED ORDER — LEVETIRACETAM IN NACL 500 MG/100ML IV SOLN
500.0000 mg | Freq: Two times a day (BID) | INTRAVENOUS | Status: DC
  Filled 2023-09-30: qty 100

## 2023-09-30 MED ORDER — CLONAZEPAM 0.5 MG PO TABS
0.5000 mg | ORAL_TABLET | Freq: Two times a day (BID) | ORAL | Status: DC
  Administered 2023-09-30 – 2023-10-01 (×3): 0.5 mg
  Filled 2023-09-30 (×3): qty 1

## 2023-09-30 MED ORDER — POLYETHYLENE GLYCOL 3350 17 G PO PACK: 17.0000 g | PACK | Freq: Every day | ORAL | Status: DC

## 2023-09-30 NOTE — Progress Notes (Signed)
 NAME:  Lori Gates, MRN:  [id number], DOB:  1956-03-09, LOS: 6 ADMISSION DATE:  09/24/2023, CONSULTATION DATE:  4/29 REFERRING MD:  Dr. Lincoln Renshaw, CHIEF COMPLAINT:  seizures   History of Present Illness:  68 year old female w/ poorly controlled  IDDM, CKD stage IV, HTN, LLD, HFpEF, substance abuse (cocaine) followed by nephrology in [place].  Hx from ED: Daughter found on floor around 1500 actively seizing, CBG read "high". Continued to seize from time of EMS dispatch to arrival to ER. Pt had received 2.5 mg versed  in route. On arrival had right facial twitching, right gaze pref. Received 2 mg IV ativan . After this gaze pref returned to midline w/ snoring respirations, no further twitching, would grimace to noxious stim.  CT head negative for acute bleed, there was left periorbital facial swelling.  MRI neg for acute process per neuro.  Intubated by EDP for airway protection  Given IV keppra  (total 2 gms)  Initial lab wk: Na 129, glucose 989, anion gap 16, wbc 13, CK 121, trop 114, VBG Ph 7.08, pco2 81, bicarb 24, lactate 2.8-->2.9 Post intubation abg: 7.42/29/130/19.2 PCXR ett good position. 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 Down time not clear CBG "high" seized from EMS notification to arrival to ED> CT and MRI brain neg for acute bleed. Loaded 2 gm keppra . Intubated airway protection   Interim History /  Subjective:  Patient was successfully extubated yesterday, postextubation had stridor, received racemic epi and nebs Overnight she was requiring BiPAP and Precedex  infusion.  Precedex  infusion is off now Remained afebrile Made 475cc urine  Objective   Blood pressure 131/77, pulse 63, temperature (!) 97.1 F (36.2 C), temperature source Axillary, resp. rate (!) 22, height 5\' 5"  (1.651 m), weight 65.6 kg, SpO2 100%.    Vent Mode: PCV;BIPAP FiO2 (%):  [50 %] 50 % Set Rate:  [15 bmp] 15 bmp Vt Set:  [450 mL] 450 mL PEEP:  [5 cmH20-7 cmH20] 7 cmH20 Pressure Support:  [5 cmH20-8 cmH20] 7 cmH20   Intake/Output Summary (Last 24 hours) at 09/30/2023 4098 Last data filed at 09/30/2023 0600 Gross per 24 hour  Intake 386.43 ml  Output 475 ml  Net -88.57 ml   Filed Weights   09/28/23 0630 09/29/23 0500 09/30/23 0500  Weight: 64 kg 64.7 kg 65.6 kg    Examination: General: Chronically ill-appearing elderly female, lying on the bed HEENT: [place]/AT, eyes anicteric.  dry mucus membranes Neuro: Awake, confused with raspy voice Chest: Faint wheezes bilaterally Heart: Regular rate and rhythm, no murmurs or gallops Abdomen: Soft, nontender, nondistended, bowel sounds present Skin: No rash Extremities: 2+ pitting edema noted bilaterally  Labs and images reviewed  Resolved Hospital Problem list   Hyponatremia, hypophosphatemia Hyperkalemia Sepsis due to UTI, POA Lactic acidosis, resolved  Assessment & Plan:  Acute metabolic encephalopathy secondary to DKA Acute toxic encephalopathy in the setting of cocaine abuse Patient remains off sedation, she is  confused but awake and following commands No signs of withdrawal DKA has been corrected  Seizure with post-ictal state Likely in the setting of DKA with blood sugar close to 1000 Neurology is following Continue Keppra  for now, she may not need follow-up as seizures hyperlipidemia high blood sugar  Acute hypoxic/hypercapnic respiratory  failure Right lower lobe pneumonia likely aspiration Patient was successfully extubated yesterday, postextubation hide stridor Received racemic epi Overnight she was on BiPAP due to increased work of breathing, currently off BiPAP on 2 L nasal cannula oxygen Completed 7-day course of ceftriaxone   Poorly controlled diabetes, presented with DKA with coma DKA has resolved Blood sugars are controlled Continue Lantus  5 units twice daily Continue sliding scale insulin  with CBG goal 140-180 Hemoglobin A1c is 14  AKI on CKD 4, requiring hemodialysis LUE AVF placed in 2024 Made 475cc urine in last 24-hours Received dialysis on Friday for the first time Nephrology is following, decision is pending if she needs repeat hemodialysis, as she is not making much urine and getting edematous Serum creatinine is uptrending, closely monitor electrolytes  Demand cardiac ischemia in the setting of acute infection and seizures Echocardiogram showed no wall motion abnormalities  LUE superficial venous thrombosis Continue warm compress  HTN HLD HFpEF Continue amlodipine  to 5 mg Continue statin GDMT as tolerated  Severe protein calorie malnutrition Continue dietary supplements Failed swallow evaluation, will place [place] and start tube feeds  Best Practice (right click and "Reselect all SmartList Selections" daily)   Diet/type: Tube feeds DVT prophylaxis prophylactic heparin   Pressure ulcer(s): N/A GI prophylaxis: NA Lines: N/A Foley:  NA Code Status:  full code Last date of multidisciplinary goals of care discussion [4/30 updated grandson over phone. States full code. Will update family at bedside today.]    Trevor Fudge, MD  [facility] See Amion for pager If no response to pager, please call [phone] until 7pm After 7pm, Please call E-link [phone]

## 2023-09-30 NOTE — Progress Notes (Signed)
 Patient ID: Lori Gates, female   DOB: 1955/08/02, 68 y.o.   MRN: [id number] [facility] Progress Note   Assessment/ Plan:   1. Acute kidney Injury on chronic kidney disease stage IV: Baseline creatinine ranging 2.5-2.8.  She has fixed urine output at about 500 cc/h with relatively unchanged BUN and creatinine but clear evidence of volume overload on physical exam.  The plan is to undertake dialysis today primarily for ultrafiltration and to continue to monitor for potential improvement of urine output.  She is not hypercatabolic and does not have any acute electrolyte abnormalities prompting intervention. 2.  Diabetic ketoacidosis: Initially with coma/altered mental status that has improved with insulin  therapy/fluids. 3.  Acute metabolic encephalopathy: Appears to be bifactorial from diabetic ketoacidosis and cocaine abuse.  Mental status gradually improving with correction of diabetic ketoacidosis.  Previously with seizure for which she is now on Keppra . 4.  Hypertension: Elevated blood pressure noted, monitor on amlodipine  and with ultrafiltration/HD.  Subjective:   Without acute events overnight, son at bedside.   Objective:   BP (!) 162/71 (BP Location: Right Arm)   Pulse 63   Temp (!) 96.1 F (35.6 C) (Axillary) Comment: Reported to the nurse, warm blankets applied.  Resp (!) 21   Ht 5\' 5"  (1.651 m)   Wt 65.6 kg   SpO2 100%   BMI 24.07 kg/m   Intake/Output Summary (Last 24 hours) at 09/30/2023 0919 Last data filed at 09/30/2023 0800 Gross per 24 hour  Intake 332.14 ml  Output 475 ml  Net -142.86 ml   Weight change: 0.9 kg  Physical Exam: Gen: Somnolent resting in bed, intermittently tearful CVS: Pulse regular rhythm, normal rate, S1 and S2 normal Resp: Clear to auscultation bilaterally, no rales/rhonchi Abd: Soft, obese, nontender, bowel sounds normal Ext: 2+ lower extremity edema, 2+ upper extremity edema.  Left brachiobasilic fistula  palpable  Imaging: DG Chest Port 1 View Result Date: 09/29/2023 CLINICAL DATA:  Acute respiratory failure EXAM: PORTABLE CHEST 1 VIEW COMPARISON:  09/26/2023 FINDINGS: Stable cardiomediastinal silhouette. Aortic atherosclerotic calcification. Streaky airspace opacities in the bilateral lower lungs. Retrocardiac consolidation. Question layering bilateral pleural effusions. No pneumothorax. Right IJ CVC tip in the mid SVC. IMPRESSION: Streaky airspace opacities in the bilateral lower lungs and retrocardiac consolidation, atelectasis versus pneumonia. Question layering bilateral pleural effusions. Electronically Signed   By: Rozell Cornet M.D.   On: 09/29/2023 22:43    Labs: BMET Recent Labs  Lab 09/25/23 1931 09/25/23 2317 09/26/23 0622 09/27/23 0600 09/28/23 0721 09/29/23 0505 09/30/23 0631  NA 139 140 138 140 138 139 140  K 4.4 5.0 5.7* 4.1 3.0* 4.1 3.9  CL 104 105 106 106 107 99 106  CO2 21* 22 21* 24 22 23 23   GLUCOSE 152* 138* 132* 173* 44* 318* 107*  BUN 19 20 21  29* 23 35* 34*  CREATININE 2.72* 2.81* 2.90* 2.89* 1.92* 2.34* 2.23*  CALCIUM  8.4* 8.4* 8.2* 7.9* 7.9* 8.1* 8.0*  PHOS  --   --   --  2.3* 2.2* 4.2 4.3   CBC Recent Labs  Lab 09/24/23 1534 09/25/23 0218 09/25/23 0447 09/25/23 0955 09/27/23 0600 09/28/23 0721  WBC 13.0*  --  8.6 6.2 10.9* 5.9  NEUTROABS 11.4*  --  6.3  --   --   --   HGB 12.0   < > 12.7 11.3* 8.8* 9.7*  HCT 38.7   < > 38.2 32.8* 26.5* 28.9*  MCV 100.0  --  94.3 93.2 95.3 92.6  PLT 277  --  235 138* 160 161   < > = values in this interval not displayed.    Medications:     (feeding supplement) PROSource Plus  30 mL Oral BID BM   amLODipine   5 mg Oral Daily   atorvastatin   80 mg Oral Daily   Chlorhexidine  Gluconate Cloth  6 each Topical Daily   heparin   5,000 Units Subcutaneous Q8H   insulin  aspart  0-6 Units Subcutaneous Q4H   insulin  glargine-yfgn  5 Units Subcutaneous BID   ipratropium-albuterol   3 mL Nebulization QID    levETIRAcetam   500 mg Oral BID   polyethylene glycol  17 g Oral Daily   thiamine   100 mg Oral Daily    Clevester Dally, MD 09/30/2023, 9:19 AM

## 2023-09-30 NOTE — Plan of Care (Signed)
  Problem: Clinical Measurements: Goal: Ability to maintain clinical measurements within normal limits will improve Outcome: Progressing Goal: Will remain free from infection Outcome: Progressing Goal: Diagnostic test results will improve Outcome: Progressing Goal: Respiratory complications will improve Outcome: Progressing Goal: Cardiovascular complication will be avoided Outcome: Progressing   Problem: Activity: Goal: Risk for activity intolerance will decrease Outcome: Progressing   Problem: Nutrition: Goal: Adequate nutrition will be maintained Outcome: Progressing   Problem: Coping: Goal: Level of anxiety will decrease Outcome: Progressing   Problem: Elimination: Goal: Will not experience complications related to bowel motility Outcome: Progressing Goal: Will not experience complications related to urinary retention Outcome: Progressing   Problem: Safety: Goal: Ability to remain free from injury will improve Outcome: Progressing   Problem: Skin Integrity: Goal: Risk for impaired skin integrity will decrease Outcome: Progressing   Problem: Fluid Volume: Goal: Ability to maintain a balanced intake and output will improve Outcome: Progressing   Problem: Metabolic: Goal: Ability to maintain appropriate glucose levels will improve Outcome: Progressing   Problem: Nutritional: Goal: Maintenance of adequate nutrition will improve Outcome: Progressing Goal: Progress toward achieving an optimal weight will improve Outcome: Progressing   Problem: Skin Integrity: Goal: Risk for impaired skin integrity will decrease Outcome: Progressing   Problem: Tissue Perfusion: Goal: Adequacy of tissue perfusion will improve Outcome: Progressing   Problem: Activity: Goal: Ability to tolerate increased activity will improve Outcome: Progressing   Problem: Respiratory: Goal: Ability to maintain a clear airway and adequate ventilation will improve Outcome: Progressing    Problem: Role Relationship: Goal: Method of communication will improve Outcome: Progressing   Problem: Cardiac: Goal: Ability to maintain an adequate cardiac output will improve Outcome: Progressing   Problem: Fluid Volume: Goal: Ability to achieve a balanced intake and output will improve Outcome: Progressing   Problem: Metabolic: Goal: Ability to maintain appropriate glucose levels will improve Outcome: Progressing   Problem: Nutritional: Goal: Maintenance of adequate nutrition will improve Outcome: Progressing Goal: Maintenance of adequate weight for body size and type will improve Outcome: Progressing   Problem: Respiratory: Goal: Will regain and/or maintain adequate ventilation Outcome: Progressing   Problem: Urinary Elimination: Goal: Ability to achieve and maintain adequate renal perfusion and functioning will improve Outcome: Progressing

## 2023-09-30 NOTE — Progress Notes (Signed)
 Speech Language Pathology Treatment: Dysphagia  Patient Details Name: Lori Gates MRN: [id number] DOB: [birth year] Today's Date: 09/30/2023 Time: 1120-1130 SLP Time Calculation (min) (ACUTE ONLY): 10 min  Assessment / Plan / Recommendation Clinical Impression  Pt seen for ongoing dysphagia management. RN reached out via secure chat as pt was ready for POs.  SLP provided oral care prior to administration of PO trials.  Xerostomia noted.  Pt resistant to oral care.  Pt again refused POs, even with family encouragement.  Small amount of water placed in oral cavity.  Pt expectorated bolus.  Pt began to push away spoon and cover face with blankets.  Family attempted to give water by spoon, which pt again expectorated.  Pt now has cortrak in place.  SLP will continue to follow for PO readiness.  Recommend pt remain NPO with alternate means of nutrition, hydration, and medication.   HPI HPI: Lori Gates is a 68 year old female who presented to ED after daughter found on floor actively seizing, CBG read "high". Continued to seize from time of EMS dispatch to arrival to ER, and was intubated for airway protection.  ETT 4/29-5/4.  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 "Stable right basilar atelectasis or infiltrate is noted." Pt with poorly controlled IDDM, CKD stage IV, HTN, LLD, HFpEF, substance abuse (cocaine) followed by nephrology in [place].      SLP Plan  Continue with current plan of care      Recommendations for follow up therapy are one component of a multi-disciplinary discharge planning process, led by the attending physician.  Recommendations may be updated based on patient status, additional functional criteria and insurance authorization.    Recommendations  Diet recommendations: NPO Medication Administration: Via alternative means                   Oral care QID     Dysphagia, unspecified (R13.10)     Continue with current plan of care     Lori Grim, MA, CCC-SLP Acute Rehabilitation Services Office: [phone] 09/30/2023, 11:38 AM

## 2023-09-30 NOTE — Progress Notes (Signed)
 Patient from ICU today. No BIPAP in room upon arrival. Spoke with RT that had patient previously, she was extubated yesterday and required Precedex  and mittens to wear the BIPAP last night. With patient now not at least on a progressive floor, BIPAP will be held per RT policy. Patient currently has mittens on and no monitoring so it would be unsafe to place her on machine.

## 2023-09-30 NOTE — Evaluation (Signed)
 Occupational Therapy Evaluation Patient Details Name: Lori Gates MRN: [id number] DOB: 1955-10-04 Today's Date: 09/30/2023   History of Present Illness   The pt is a 68 yo female presenting 4/29 after being found down, unresponsive, seizing, and with hyperglycemia (>700). Intubated 4/29-5/4. UDS + for cocaine and benzos. Pt admitted for management of DKA, seizures, and UTI/sepsis. HD initiated 5/1. PMH includes: asthma, DM II, HTN.     Clinical Impressions Pt in bed on arrival and agreeable to PT/OT collaborative evaluation. Pt providing history with grandson present who confirms independence with ADL and walking with RW; daughter assists with IADL, but unable to confirm home set-up. Upon eval, pt presents with generalized weakness, poor safety awareness, problem solving, balance. Pt needing max cues for simple multistep (come to EOB), and max A +2 for STS progressing to mod A +2. Pt able to follow basic one step commands and perseverative on ADL at times. Due to significant change in functional status, recommending intensive multidisciplinary rehabilitation >3 hours/day to optimize safety and independence in ADL.       If plan is discharge home, recommend the following:   Two people to help with walking and/or transfers;Two people to help with bathing/dressing/bathroom;Assistance with feeding;Assistance with cooking/housework;Direct supervision/assist for medications management;Direct supervision/assist for financial management;Assist for transportation;Help with stairs or ramp for entrance;Supervision due to cognitive status     Functional Status Assessment   Patient has had a recent decline in their functional status and demonstrates the ability to make significant improvements in function in a reasonable and predictable amount of time.     Equipment Recommendations   Other (comment) (defer)     Recommendations for Other Services   Rehab consult;Speech consult      Precautions/Restrictions   Precautions Precautions: Fall Recall of Precautions/Restrictions: Impaired Precaution/Restrictions Comments: cortrak, rectal pouch, bilateral mittens Restrictions Weight Bearing Restrictions Per Provider Order: No     Mobility Bed Mobility Overal bed mobility: Needs Assistance Bed Mobility: Rolling, Supine to Sit, Sit to Supine Rolling: Mod assist   Supine to sit: Mod assist, +2 for physical assistance, +2 for safety/equipment Sit to supine: Mod assist, +2 for safety/equipment, +2 for physical assistance   General bed mobility comments: modA to manage LE movement and elevation of trunk. maxA initially with posterior lean    Transfers Overall transfer level: Needs assistance Equipment used: 2 person hand held assist Transfers: Sit to/from Stand Sit to Stand: Total assist, +2 physical assistance, +2 safety/equipment, From elevated surface           General transfer comment: max-totalA to initiate movement, then modA to maintain standing. blocking R knee, pt hyperextends R knee as well at times. completed x2      Balance Overall balance assessment: Needs assistance Sitting-balance support: Bilateral upper extremity supported, Feet supported Sitting balance-Leahy Scale: Poor Sitting balance - Comments: initially maxA to maintain sitting, progressed to moments of CGA Postural control: Posterior lean, Left lateral lean Standing balance support: Bilateral upper extremity supported, During functional activity Standing balance-Leahy Scale: Poor Standing balance comment: dependent on modA of 2 to maintain static standing                           ADL either performed or assessed with clinical judgement   ADL Overall ADL's : Needs assistance/impaired Eating/Feeding: NPO   Grooming: Wash/dry face;Minimal assistance;Sitting   Upper Body Bathing: Moderate assistance;Sitting   Lower Body Bathing: Sit to/from stand;+2 for  safety/equipment;+2 for  physical assistance;Total assistance   Upper Body Dressing : Sitting;Maximal assistance   Lower Body Dressing: +2 for physical assistance;+2 for safety/equipment;Sit to/from stand;Total assistance   Toilet Transfer: Total assistance   Toileting- Clothing Manipulation and Hygiene: Total assistance;+2 for physical assistance;+2 for safety/equipment;Sit to/from stand               Vision   Additional Comments: to continue to assess. Pt eyes closed much of session     Perception         Praxis         Pertinent Vitals/Pain Pain Assessment Pain Assessment: Faces Faces Pain Scale: Hurts little more Pain Location: pt unable to localize Pain Descriptors / Indicators: Discomfort, Crying Pain Intervention(s): Limited activity within patient's tolerance, Monitored during session     Extremity/Trunk Assessment Upper Extremity Assessment Upper Extremity Assessment: Generalized weakness;Difficult to assess due to impaired cognition;LUE deficits/detail;RUE deficits/detail (BUE weak with needing increased time to reach to face) RUE Deficits / Details: seemingly decreased initiation intermittently LUE Deficits / Details: edematous, repositioned on pillow to elevate   Lower Extremity Assessment Lower Extremity Assessment: Defer to PT evaluation   Cervical / Trunk Assessment Cervical / Trunk Assessment: Kyphotic   Communication Communication Communication: Impaired Factors Affecting Communication: Non - English speaking, interpreter not available   Cognition Arousal: Lethargic, Alert Behavior During Therapy: Lability, Flat affect, Anxious Cognition: Cognition impaired   Orientation impairments: Place, Time, Situation Awareness: Intellectual awareness impaired, Online awareness impaired Memory impairment (select all impairments): Short-term memory, Working [employer] fast streamer, Engineer, structural memory Attention impairment (select first level of impairment):  Focused attention Executive functioning impairment (select all impairments): Initiation, Organization, Sequencing, Reasoning, Problem solving OT - Cognition Comments: Pt intermittently able to follow direct one step commands ~40% of session. Needs frequent redirection, confused throughout. Perseverative on washing face EOB with pt continuing to reach to face even after handing wash cloth back                 Following commands: Impaired Following commands impaired: Follows one step commands inconsistently, Follows one step commands with increased time     Cueing  General Comments   Cueing Techniques: Verbal cues;Gestural cues;Tactile cues      Exercises     Shoulder Instructions      Home Living Family/patient expects to be discharged to:: Private residence Living Arrangements: Children Available Help at Discharge: Family;Available PRN/intermittently (daughter works) Type of Home: House                       Home Equipment: Agricultural consultant (2 wheels)   Additional Comments: grandson present, pt unreliable historian      Prior Functioning/Environment Prior Level of Function : Independent/Modified Independent             Mobility Comments: uses RW, 2 other falls. sedentary lifestyle. ADLs Comments: independent with ADLs, daughter does IADLs    OT Problem List: Decreased strength;Decreased activity tolerance;Impaired balance (sitting and/or standing);Decreased coordination;Decreased cognition;Decreased safety awareness;Impaired UE functional use;Increased edema   OT Treatment/Interventions: Self-care/ADL training;Therapeutic exercise;DME and/or AE instruction;Patient/family education;Balance training;Therapeutic activities;Cognitive remediation/compensation;Visual/perceptual remediation/compensation      OT Goals(Current goals can be found in the care plan section)   Acute Rehab OT Goals Patient Stated Goal: get a warm blanket OT Goal Formulation: With  patient Time For Goal Achievement: 10/14/23 Potential to Achieve Goals: Good   OT Frequency:  Min 2X/week    Co-evaluation PT/OT/SLP Co-Evaluation/Treatment: Yes Reason for Co-Treatment: Complexity of the patient's  impairments (multi-system involvement);Necessary to address cognition/behavior during functional activity;For patient/therapist safety;To address functional/ADL transfers PT goals addressed during session: Mobility/safety with mobility;Balance;Strengthening/ROM OT goals addressed during session: Strengthening/ROM      AM-PAC OT "6 Clicks" Daily Activity     Outcome Measure Help from another person eating meals?: Total Help from another person taking care of personal grooming?: A Lot Help from another person toileting, which includes using toliet, bedpan, or urinal?: Total Help from another person bathing (including washing, rinsing, drying)?: A Lot Help from another person to put on and taking off regular upper body clothing?: A Lot Help from another person to put on and taking off regular lower body clothing?: Total 6 Click Score: 9   End of Session Nurse Communication: Mobility status  Activity Tolerance: Patient tolerated treatment well Patient left: in bed;with call bell/phone within reach;with bed alarm set;with family/visitor present  OT Visit Diagnosis: Unsteadiness on feet (R26.81);Muscle weakness (generalized) (M62.81);History of falling (Z91.81);Other symptoms and signs involving cognitive function                Time: 9629-5284 OT Time Calculation (min): 34 min Charges:  OT General Charges $OT Visit: 1 Visit OT Evaluation $OT Eval Moderate Complexity: 1 Mod  Karilyn Ouch, OTR/L [facility] Acute Rehabilitation Office: [phone]   Emery Hans 09/30/2023, 5:21 PM

## 2023-09-30 NOTE — Procedures (Signed)
 Cortrak  Person Inserting Tube:  Norvel Beer, RD Tube Type:  Cortrak - 43 inches Tube Size:  10 Tube Location:  Right nare Secured by: Bridle Technique Used to Measure Tube Placement:  Marking at nare/corner of mouth Cortrak Secured At:  65 cm   Cortrak Tube Team Note:  Consult received to place a Cortrak feeding tube.   No x-ray is required. RN may begin using tube.   If the tube becomes dislodged please keep the tube and contact the Cortrak team at [email or web address] for replacement.  If after hours and replacement cannot be delayed, place a NG tube and confirm placement with an abdominal x-ray.   Norvel Beer MS, RDN, LDN, CNSC Registered Dietitian 3 Clinical Nutrition RD Inpatient Contact Info in Amion

## 2023-09-30 NOTE — Progress Notes (Signed)
 SLP Cancellation Note  Patient Details Name: [name] MRN: [id number] DOB: 1956/03/25   Cancelled treatment:        Attempted to see pt for ongoing dysphagia management.  Pt continues to have loud/stridorous breath sounds.  Today oral cavity appears dry.  She refuses PO trials today, despite being very eager to eat and drink yesterday.  SLP will continue to follow up for PO readiness.  Should pt feel ready later today, please message [facility] SLP group via secure chat or call acute rehab department at [phone].   Elester Grim, MA, CCC-SLP Acute Rehabilitation Services Office: [phone] 09/30/2023, 9:11 AM

## 2023-09-30 NOTE — Evaluation (Signed)
 Physical Therapy Evaluation Patient Details Name: Lori Gates MRN: [id number] DOB: 08-15-1955 Today's Date: 09/30/2023  History of Present Illness  The pt is a 68 yo female presenting 4/29 after being found down, unresponsive, seizing, and with hyperglycemia (>700). Intubated 4/29-5/4. UDS + for cocaine and benzos. Pt admitted for management of DKA, seizures, and UTI/sepsis. HD initiated 5/1. PMH includes: asthma, DM II, HTN.   Clinical Impression  Pt in bed upon arrival of PT, agreeable to evaluation at this time. Prior to admission the pt was independent with mobility, walking with RW, living with her daughter, and reports enjoying sitting on the porch in the sunshine. The pt presents today with deficits in functional mobility, strength, power, coordination, safety awareness, problem solving, stability, and activity tolerance due to above dx, and will continue to benefit from skilled PT to address these deficits. She required modA to complete all bed mobility as well as max sequential cues for technique and to continue movements. The pt initially needed maxA to maintain static sitting due to posterior and left lateral lean, but was able to progress to moments of CGA. The pt tolerated standing but demos poor functional strength and power in BLE, especially in RLE as she required blocking of R knee and often hyperextends it against the bed. Given prior level of independence and good family support, recommend intensive post-acute therapies >3 hours/day to facilitate return to independence.          If plan is discharge home, recommend the following: Two people to help with walking and/or transfers;Two people to help with bathing/dressing/bathroom;Assistance with cooking/housework;Assistance with feeding;Direct supervision/assist for medications management;Direct supervision/assist for financial management;Assist for transportation;Help with stairs or ramp for entrance;Supervision due to cognitive  status   Can travel by private vehicle        Equipment Recommendations Wheelchair (measurements PT);Wheelchair cushion (measurements PT)  Recommendations for Other Services  Rehab consult    Functional Status Assessment Patient has had a recent decline in their functional status and demonstrates the ability to make significant improvements in function in a reasonable and predictable amount of time.     Precautions / Restrictions Precautions Precautions: Fall Recall of Precautions/Restrictions: Impaired Precaution/Restrictions Comments: cortrak, rectal pouch, bilateral mittens Restrictions Weight Bearing Restrictions Per Provider Order: No      Mobility  Bed Mobility Overal bed mobility: Needs Assistance Bed Mobility: Rolling, Supine to Sit, Sit to Supine Rolling: Mod assist   Supine to sit: Mod assist, +2 for physical assistance, +2 for safety/equipment Sit to supine: Mod assist, +2 for safety/equipment, +2 for physical assistance   General bed mobility comments: modA to manage LE movement and elevation of trunk. maxA initially with posterior lean    Transfers Overall transfer level: Needs assistance Equipment used: 2 person hand held assist Transfers: Sit to/from Stand Sit to Stand: Total assist, +2 physical assistance, +2 safety/equipment, From elevated surface           General transfer comment: max-totalA to initiate movement, then modA to maintain standing. blocking R knee, pt hyperextends R knee as well at times. completed x2      Balance Overall balance assessment: Needs assistance Sitting-balance support: Bilateral upper extremity supported, Feet supported Sitting balance-Leahy Scale: Poor Sitting balance - Comments: initially maxA to maintain sitting, progressed to moments of CGA Postural control: Posterior lean, Left lateral lean Standing balance support: Bilateral upper extremity supported, During functional activity Standing balance-Leahy Scale:  Poor Standing balance comment: dependent on modA of 2 to maintain static  standing                             Pertinent Vitals/Pain Pain Assessment Pain Assessment: Faces Faces Pain Scale: Hurts little more Pain Descriptors / Indicators: Discomfort, Crying Pain Intervention(s): Limited activity within patient's tolerance, Monitored during session, Repositioned    Home Living Family/patient expects to be discharged to:: Private residence Living Arrangements: Children Available Help at Discharge: Family;Available PRN/intermittently (daughter works) Type of Home: House           Home Equipment: Agricultural consultant (2 wheels) Additional Comments: grandson present, pt unreliable historian    Prior Function Prior Level of Function : Independent/Modified Independent             Mobility Comments: uses RW, 2 other falls. sedentary lifestyle. ADLs Comments: independent with ADLs, daughter does IADLs     Extremity/Trunk Assessment   Upper Extremity Assessment Upper Extremity Assessment: Defer to OT evaluation    Lower Extremity Assessment Lower Extremity Assessment: Generalized weakness;Difficult to assess due to impaired cognition    Cervical / Trunk Assessment Cervical / Trunk Assessment: Kyphotic  Communication   Communication Communication: Impaired Factors Affecting Communication: Non - English speaking, interpreter not available    Cognition Arousal: Lethargic, Alert Behavior During Therapy: Lability, Flat affect, Anxious   PT - Cognitive impairments: Difficult to assess, Awareness, Memory, Initiation, Sequencing, Problem solving, Safety/Judgement Difficult to assess due to: Level of arousal                     PT - Cognition Comments: inconsistent command following or attempt to answer questions. limited at times by emotional lability Following commands: Impaired Following commands impaired: Follows one step commands inconsistently, Follows  one step commands with increased time     Cueing Cueing Techniques: Verbal cues, Gestural cues, Tactile cues     General Comments General comments (skin integrity, edema, etc.): swelling to LUE    Exercises     Assessment/Plan    PT Assessment Patient needs continued PT services  PT Problem List Decreased strength;Decreased range of motion;Decreased activity tolerance;Decreased balance;Decreased mobility;Decreased coordination;Decreased knowledge of use of DME;Decreased cognition;Decreased safety awareness;Decreased knowledge of precautions       PT Treatment Interventions DME instruction;Gait training;Stair training;Functional mobility training;Therapeutic activities;Therapeutic exercise;Balance training;Neuromuscular re-education;Cognitive remediation;Patient/family education    PT Goals (Current goals can be found in the Care Plan section)  Acute Rehab PT Goals Patient Stated Goal: to go home PT Goal Formulation: With patient Time For Goal Achievement: 10/14/23 Potential to Achieve Goals: Good    Frequency Min 2X/week     Co-evaluation PT/OT/SLP Co-Evaluation/Treatment: Yes Reason for Co-Treatment: Complexity of the patient's impairments (multi-system involvement);Necessary to address cognition/behavior during functional activity;For patient/therapist safety;To address functional/ADL transfers PT goals addressed during session: Mobility/safety with mobility;Balance;Strengthening/ROM         AM-PAC PT "6 Clicks" Mobility  Outcome Measure Help needed turning from your back to your side while in a flat bed without using bedrails?: A Lot Help needed moving from lying on your back to sitting on the side of a flat bed without using bedrails?: A Lot Help needed moving to and from a bed to a chair (including a wheelchair)?: Total Help needed standing up from a chair using your arms (e.g., wheelchair or bedside chair)?: Total Help needed to walk in hospital room?: Total Help  needed climbing 3-5 steps with a railing? : Total 6 Click Score: 8  End of Session Equipment Utilized During Treatment: Gait belt Activity Tolerance: Patient tolerated treatment well Patient left: in bed;with call bell/phone within reach;with bed alarm set;with family/visitor present Nurse Communication: Mobility status;Other (comment) (O2 on RA) PT Visit Diagnosis: Muscle weakness (generalized) (M62.81);Unsteadiness on feet (R26.81);Other abnormalities of gait and mobility (R26.89)    Time: 7829-5621 PT Time Calculation (min) (ACUTE ONLY): 29 min   Charges:   PT Evaluation $PT Eval Moderate Complexity: 1 Mod   PT General Charges $$ ACUTE PT VISIT: 1 Visit         Barnabas Booth, PT, DPT   Acute Rehabilitation Department Office [phone] Secure Chat Communication Preferred  Lona Rist 09/30/2023, 3:54 PM

## 2023-10-01 ENCOUNTER — Inpatient Hospital Stay (HOSPITAL_COMMUNITY): Payer: Medicare (Managed Care)

## 2023-10-01 DIAGNOSIS — G928 Other toxic encephalopathy: Secondary | ICD-10-CM | POA: Diagnosis not present

## 2023-10-01 DIAGNOSIS — J381 Polyp of vocal cord and larynx: Secondary | ICD-10-CM

## 2023-10-01 DIAGNOSIS — E1111 Type 2 diabetes mellitus with ketoacidosis with coma: Secondary | ICD-10-CM | POA: Diagnosis not present

## 2023-10-01 DIAGNOSIS — J9601 Acute respiratory failure with hypoxia: Secondary | ICD-10-CM | POA: Diagnosis not present

## 2023-10-01 DIAGNOSIS — J3800 Paralysis of vocal cords and larynx, unspecified: Secondary | ICD-10-CM | POA: Diagnosis not present

## 2023-10-01 DIAGNOSIS — R569 Unspecified convulsions: Secondary | ICD-10-CM | POA: Diagnosis not present

## 2023-10-01 DIAGNOSIS — R061 Stridor: Secondary | ICD-10-CM

## 2023-10-01 LAB — GLUCOSE, CAPILLARY
Glucose-Capillary: 245 mg/dL — ABNORMAL HIGH (ref 70–99)
Glucose-Capillary: 228 mg/dL — ABNORMAL HIGH (ref 70–99)
Glucose-Capillary: 258 mg/dL — ABNORMAL HIGH (ref 70–99)
Glucose-Capillary: 265 mg/dL — ABNORMAL HIGH (ref 70–99)
Glucose-Capillary: 284 mg/dL — ABNORMAL HIGH (ref 70–99)
Glucose-Capillary: 302 mg/dL — ABNORMAL HIGH (ref 70–99)

## 2023-10-01 LAB — CBC WITH DIFFERENTIAL/PLATELET
Abs Immature Granulocytes: 0.04 10*3/uL (ref 0.00–0.07)
Basophils Absolute: 0 10*3/uL (ref 0.0–0.1)
Basophils Relative: 1 %
Eosinophils Absolute: 0.3 10*3/uL (ref 0.0–0.5)
Eosinophils Relative: 5 %
HCT: 32 % — ABNORMAL LOW (ref 36.0–46.0)
Hemoglobin: 10.4 g/dL — ABNORMAL LOW (ref 12.0–15.0)
Immature Granulocytes: 1 %
Lymphocytes Relative: 32 %
Lymphs Abs: 1.9 10*3/uL (ref 0.7–4.0)
MCH: 30.8 pg (ref 26.0–34.0)
MCHC: 32.5 g/dL (ref 30.0–36.0)
MCV: 94.7 fL (ref 80.0–100.0)
Monocytes Absolute: 0.9 10*3/uL (ref 0.1–1.0)
Monocytes Relative: 14 %
Neutro Abs: 2.9 10*3/uL (ref 1.7–7.7)
Neutrophils Relative %: 47 %
Platelets: 267 10*3/uL (ref 150–400)
RBC: 3.38 MIL/uL — ABNORMAL LOW (ref 3.87–5.11)
RDW: 12.5 % (ref 11.5–15.5)
WBC: 6 10*3/uL (ref 4.0–10.5)
nRBC: 0 % (ref 0.0–0.2)

## 2023-10-01 LAB — COMPREHENSIVE METABOLIC PANEL WITH GFR
ALT: 20 U/L (ref 0–44)
AST: 32 U/L (ref 15–41)
Albumin: 2.3 g/dL — ABNORMAL LOW (ref 3.5–5.0)
Alkaline Phosphatase: 169 U/L — ABNORMAL HIGH (ref 38–126)
Anion gap: 14 (ref 5–15)
BUN: 35 mg/dL — ABNORMAL HIGH (ref 8–23)
CO2: 24 mmol/L (ref 22–32)
Calcium: 8.2 mg/dL — ABNORMAL LOW (ref 8.9–10.3)
Chloride: 103 mmol/L (ref 98–111)
Creatinine, Ser: 2.27 mg/dL — ABNORMAL HIGH (ref 0.44–1.00)
GFR, Estimated: 23 mL/min — ABNORMAL LOW (ref >60)
Glucose, Bld: 240 mg/dL — ABNORMAL HIGH (ref 70–99)
Potassium: 3.5 mmol/L (ref 3.5–5.1)
Sodium: 141 mmol/L (ref 135–145)
Total Bilirubin: 0.3 mg/dL (ref 0.0–1.2)
Total Protein: 5.9 g/dL — ABNORMAL LOW (ref 6.5–8.1)

## 2023-10-01 LAB — BASIC METABOLIC PANEL WITH GFR
Anion gap: 9 (ref 5–15)
BUN: 37 mg/dL — ABNORMAL HIGH (ref 8–23)
CO2: 24 mmol/L (ref 22–32)
Calcium: 8.2 mg/dL — ABNORMAL LOW (ref 8.9–10.3)
Chloride: 107 mmol/L (ref 98–111)
Creatinine, Ser: 2.2 mg/dL — ABNORMAL HIGH (ref 0.44–1.00)
GFR, Estimated: 24 mL/min — ABNORMAL LOW (ref >60)
Glucose, Bld: 268 mg/dL — ABNORMAL HIGH (ref 70–99)
Potassium: 4 mmol/L (ref 3.5–5.1)
Sodium: 140 mmol/L (ref 135–145)

## 2023-10-01 LAB — T4, FREE: Free T4: 0.89 ng/dL (ref 0.61–1.12)

## 2023-10-01 LAB — MAGNESIUM: Magnesium: 2.2 mg/dL (ref 1.7–2.4)

## 2023-10-01 LAB — TSH: TSH: 7.136 u[IU]/mL — ABNORMAL HIGH (ref 0.350–4.500)

## 2023-10-01 MED ORDER — TORSEMIDE 20 MG PO TABS: 20.0000 mg | ORAL_TABLET | Freq: Two times a day (BID) | ORAL | Status: DC

## 2023-10-01 MED ORDER — RACEPINEPHRINE HCL 2.25 % IN NEBU
0.5000 mL | INHALATION_SOLUTION | Freq: Once | RESPIRATORY_TRACT | Status: DC
  Filled 2023-10-01 (×2): qty 0.5

## 2023-10-01 MED ORDER — BANATROL TF EN LIQD
60.0000 mL | Freq: Two times a day (BID) | ENTERAL | Status: DC
  Administered 2023-10-01 – 2023-10-03 (×4): 60 mL
  Filled 2023-10-01 (×4): qty 60

## 2023-10-01 MED ORDER — INSULIN GLARGINE-YFGN 100 UNIT/ML ~~LOC~~ SOLN
10.0000 [IU] | Freq: Two times a day (BID) | SUBCUTANEOUS | Status: DC
  Filled 2023-10-01: qty 0.1

## 2023-10-01 MED ORDER — METOPROLOL TARTRATE 25 MG/10 ML ORAL SUSPENSION
25.0000 mg | Freq: Two times a day (BID) | ORAL | Status: DC
  Administered 2023-10-01 – 2023-10-03 (×5): 25 mg
  Filled 2023-10-01 (×6): qty 10

## 2023-10-01 MED ORDER — SODIUM CHLORIDE 0.9% FLUSH: 10.0000 mL | INTRAVENOUS | Status: DC | PRN

## 2023-10-01 MED ORDER — FAMOTIDINE 20 MG PO TABS
20.0000 mg | ORAL_TABLET | Freq: Every day | ORAL | Status: DC
  Administered 2023-10-01: 20 mg
  Filled 2023-10-01: qty 1

## 2023-10-01 MED ORDER — LEVALBUTEROL HCL 0.63 MG/3ML IN NEBU: 0.6300 mg | INHALATION_SOLUTION | Freq: Four times a day (QID) | RESPIRATORY_TRACT | Status: DC | PRN

## 2023-10-01 MED ORDER — INSULIN ASPART 100 UNIT/ML IJ SOLN
0.0000 [IU] | INTRAMUSCULAR | Status: DC
  Administered 2023-10-01: 3 [IU] via SUBCUTANEOUS

## 2023-10-01 MED ORDER — SODIUM CHLORIDE 0.9% FLUSH
10.0000 mL | Freq: Two times a day (BID) | INTRAVENOUS | Status: DC
Start: 2023-10-02 — End: 2023-10-20
  Administered 2023-10-02 – 2023-10-19 (×32): 10 mL

## 2023-10-01 MED ORDER — TORSEMIDE 20 MG PO TABS
20.0000 mg | ORAL_TABLET | Freq: Two times a day (BID) | ORAL | Status: DC
  Filled 2023-10-01: qty 1

## 2023-10-01 MED ORDER — LEVALBUTEROL HCL 0.63 MG/3ML IN NEBU
0.6300 mg | INHALATION_SOLUTION | Freq: Four times a day (QID) | RESPIRATORY_TRACT | Status: DC
Start: 2023-10-01 — End: 2023-10-01

## 2023-10-01 MED ORDER — METOPROLOL TARTRATE 5 MG/5ML IV SOLN
2.5000 mg | INTRAVENOUS | Status: DC | PRN
  Administered 2023-10-02 – 2023-10-03 (×2): 2.5 mg via INTRAVENOUS
  Filled 2023-10-01 (×2): qty 5

## 2023-10-01 MED ORDER — RACEPINEPHRINE HCL 2.25 % IN NEBU: 0.5000 mL | INHALATION_SOLUTION | RESPIRATORY_TRACT | Status: DC | PRN

## 2023-10-01 MED ORDER — DILTIAZEM HCL-DEXTROSE 125-5 MG/125ML-% IV SOLN (PREMIX)
5.0000 mg/h | INTRAVENOUS | Status: DC
  Filled 2023-10-01: qty 125

## 2023-10-01 MED ORDER — TORSEMIDE 20 MG PO TABS
20.0000 mg | ORAL_TABLET | Freq: Two times a day (BID) | ORAL | Status: DC
  Administered 2023-10-01 – 2023-10-03 (×4): 20 mg
  Filled 2023-10-01 (×4): qty 1

## 2023-10-01 MED ORDER — DILTIAZEM HCL 25 MG/5ML IV SOLN
10.0000 mg | Freq: Once | INTRAVENOUS | Status: AC
  Administered 2023-10-01: 10 mg via INTRAVENOUS
  Filled 2023-10-01: qty 5

## 2023-10-01 MED ORDER — INSULIN ASPART 100 UNIT/ML IJ SOLN
0.0000 [IU] | INTRAMUSCULAR | Status: DC
  Administered 2023-10-01 (×3): 8 [IU] via SUBCUTANEOUS
  Administered 2023-10-02: 3 [IU] via SUBCUTANEOUS
  Administered 2023-10-02: 5 [IU] via SUBCUTANEOUS
  Administered 2023-10-02: 3 [IU] via SUBCUTANEOUS
  Administered 2023-10-02: 11 [IU] via SUBCUTANEOUS
  Administered 2023-10-02 – 2023-10-03 (×3): 3 [IU] via SUBCUTANEOUS

## 2023-10-01 MED ORDER — METOPROLOL TARTRATE 25 MG/10 ML ORAL SUSPENSION
12.5000 mg | Freq: Two times a day (BID) | ORAL | Status: DC
  Filled 2023-10-01 (×2): qty 5

## 2023-10-01 MED ORDER — IPRATROPIUM BROMIDE 0.02 % IN SOLN: 0.5000 mg | Freq: Four times a day (QID) | RESPIRATORY_TRACT | Status: DC

## 2023-10-01 MED ORDER — FAMOTIDINE 20 MG PO TABS: 20.0000 mg | ORAL_TABLET | Freq: Every day | ORAL | Status: DC

## 2023-10-01 MED ORDER — METHYLPREDNISOLONE SODIUM SUCC 40 MG IJ SOLR
40.0000 mg | Freq: Every day | INTRAMUSCULAR | Status: DC
  Administered 2023-10-01: 40 mg via INTRAVENOUS
  Filled 2023-10-01: qty 1

## 2023-10-01 MED ORDER — LORAZEPAM 2 MG/ML PO CONC: 0.5000 mg | ORAL | Status: DC | PRN

## 2023-10-01 MED ORDER — IPRATROPIUM BROMIDE 0.02 % IN SOLN: 0.5000 mg | Freq: Four times a day (QID) | RESPIRATORY_TRACT | Status: DC | PRN

## 2023-10-01 MED ORDER — INSULIN GLARGINE-YFGN 100 UNIT/ML ~~LOC~~ SOLN
5.0000 [IU] | Freq: Every day | SUBCUTANEOUS | Status: DC
  Administered 2023-10-03 – 2023-10-04 (×2): 5 [IU] via SUBCUTANEOUS
  Filled 2023-10-01 (×3): qty 0.05

## 2023-10-01 NOTE — Plan of Care (Signed)
 SVT and Afib episode today. Cardizem IVP x 1. No drip started. Bedside scope per MD. Respiratory nebulizer's started.     Problem: Education: Goal: Knowledge of General Education information will improve Description: Including pain rating scale, medication(s)/side effects and non-pharmacologic comfort measures Outcome: Progressing   Problem: Clinical Measurements: Goal: Respiratory complications will improve Outcome: Progressing   Problem: Activity: Goal: Risk for activity intolerance will decrease Outcome: Progressing   Problem: Pain Managment: Goal: General experience of comfort will improve and/or be controlled Outcome: Progressing   Problem: Safety: Goal: Ability to remain free from injury will improve Outcome: Progressing   Problem: Skin Integrity: Goal: Risk for impaired skin integrity will decrease Outcome: Progressing   Problem: Education: Goal: Ability to describe self-care measures that may prevent or decrease complications (Diabetes Survival Skills Education) will improve Outcome: Progressing   Problem: Cardiac: Goal: Ability to maintain an adequate cardiac output will improve Outcome: Progressing   Problem: Nutritional: Goal: Maintenance of adequate nutrition will improve Outcome: Progressing   Problem: Respiratory: Goal: Will regain and/or maintain adequate ventilation Outcome: Progressing

## 2023-10-01 NOTE — Progress Notes (Signed)
 NAME:  LORIAH DELIRA, MRN:  [id number], DOB:  22-Feb-1956, LOS: 7 ADMISSION DATE:  09/24/2023, CONSULTATION DATE:  4/29 REFERRING MD:  Dr. Lincoln Renshaw, CHIEF COMPLAINT:  seizures   History of Present Illness:  68 year old female w/ poorly controlled  IDDM, CKD stage IV, HTN, LLD, HFpEF, substance abuse (cocaine) followed by nephrology in [place].  Hx from ED: Daughter found on floor around 1500 actively seizing, CBG read "high". Continued to seize from time of EMS dispatch to arrival to ER. Pt had received 2.5 mg versed  in route. On arrival had right facial twitching, right gaze pref. Received 2 mg IV ativan . After this gaze pref returned to midline w/ snoring respirations, no further twitching, would grimace to noxious stim.  CT head negative for acute bleed, there was left periorbital facial swelling.  MRI neg for acute process per neuro.  Intubated by EDP for airway protection  Given IV keppra  (total 2 gms)  Initial lab wk: Na 129, glucose 989, anion gap 16, wbc 13, CK 121, trop 114, VBG Ph 7.08, pco2 81, bicarb 24, lactate 2.8-->2.9 Post intubation abg: 7.42/29/130/19.2 PCXR ett good position. 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 Down time not clear CBG "high" seized from EMS notification to arrival to ED> CT and MRI brain neg for acute bleed. Loaded 2 gm keppra . Intubated airway protection   Interim History /  Subjective:  Patient continued to have stridor but no respiratory distress She is agitated and restless Goes in and out of A-fib  Objective   Blood pressure (!) 157/69, pulse 100, temperature 98.2 F (36.8 C), temperature source Oral, resp. rate (!) 24, height 5\' 5"  (1.651 m), weight 69.6 kg, SpO2 93%.        Intake/Output Summary (Last 24 hours) at 10/01/2023 1315 Last data filed at 10/01/2023 0300 Gross per 24 hour  Intake 590 ml  Output 50 ml  Net 540 ml   Filed Weights   09/29/23 0500 09/30/23 0500 10/01/23 0457  Weight: 64.7 kg 65.6 kg 69.6 kg    Examination: General: Chronically ill-appearing female, lying on the bed HEENT: [place]/AT, eyes anicteric.  Dry mucus membranes Neuro: Awake, intermittently following commands, confused with raspy voice Chest: Faint bilateral wheezes noted Heart: Irregular irregular with controlled rate, no murmurs or gallops Abdomen: Soft, nontender, nondistended, bowel sounds present   Labs and images reviewed  Resolved Hospital Problem list   Hyponatremia, hypophosphatemia Hyperkalemia Sepsis due to UTI, POA Lactic acidosis, resolved  Assessment & Plan:  Acute metabolic encephalopathy secondary to DKA Acute toxic encephalopathy in the setting of cocaine abuse Remained agitated and restless but awake and intermittently following commands  Seizure with post-ictal state Likely in the setting of DKA with blood sugar close to 1000 Continue Keppra  for now and stop in next 2 days per neurology recommendations  Acute hypoxic/hypercapnic respiratory failure Right lower lobe pneumonia likely aspiration Postextubation and steroid Completed 7-day course of antibiotic,  currently on room air Continue Solu-Medrol 40 mg for 3 days  Poorly controlled diabetes, presented with DKA with coma DKA has resolved Blood sugars are elevated, in the setting of steroid Increase Lantus  10 units twice daily Continue sliding scale insulin  with CBG goal  140-180 Hemoglobin A1c is 14  Rest of the management per primary team     Trevor Fudge, MD [facility] See Amion for pager If no response to pager, please call [phone] until 7pm After 7pm, Please call E-link [phone]

## 2023-10-01 NOTE — Progress Notes (Signed)
 SLP Cancellation Note  Patient Details Name: Lori Gates MRN: [id number] DOB: February 12, 1956   Cancelled treatment:        Attempted to see pt for ongoing dysphagia management.  Spoke with RN.  Pt tachycardic and about to receive cardizem drip.  Pt not medically ready for PO trials at this time.  SLP will return as schedule permits.    Lori Gates , MA, CCC-SLP Acute Rehabilitation Services Office: [phone] 10/01/2023, 11:04 AM

## 2023-10-01 NOTE — Progress Notes (Signed)
 1120- called to bedside by RN d/t racemic epi neb order.  Currently no noted stridor noted, however RN reports that MD heard stridor earlier.  After a couple of minutes of observing no noted stridor, then noted "upper airway inspiratory/stridor" noise.  Noted HR 150, upper airway noise is intermittent.  No distress noted, sat 93% on room air.  I repositioned pt to sit upright.  I spoke w/ Dr Lydia Sams who recently spoke w/ CCM- per Dr Lydia Sams  hold off on racemic epi neb for now d/t HR.    1145- Steve Minor NP w/ CCM in room, per our disucssion- hold off on giving racemic epi neb at this time d/t HR and also only intermittent upper airway noise heard currently.    No distress noted, sat is 93% on room air.

## 2023-10-01 NOTE — Progress Notes (Signed)
 Nutrition Follow-up  DOCUMENTATION CODES:  Severe malnutrition in context of chronic illness  INTERVENTION:  Continue current TF regimen via cortrak: Osmolite 1.5 at 40 ml/hr (960 ml per day) Prosource TF20 60 ml BID Provides 1600 kcal, 100 gm protein, 729 ml free water daily Banatrol BID-provides 45kcal, 5g soluble fiber and 2g protein per serving.  NUTRITION DIAGNOSIS:  Severe Malnutrition related to chronic illness (uncontrolled DM, substance abuse) as evidenced by severe fat depletion, severe muscle depletion. - remains applicable  GOAL:  Patient will meet greater than or equal to 90% of their needs - progressing  MONITOR:  Vent status, TF tolerance  REASON FOR ASSESSMENT:  Consult Enteral/tube feeding initiation and management  ASSESSMENT:  Pt with PMH of CKD stage IV, uncontrolled IDDM, HTN, HLD, smoking, cocaine abuse (reports stopped March 2024), CHF admitted from home for seizures. UDS positive for cocaine and benzos.  4/29 - admit from home with seizures, intubated  5/1 - nutrition initiated, HD initiated 5/4 - extubated, BSE NPO 5/5 - cortrak placed, BSE NPO, transferred out of ICU  Pt resting in bed at the time of assessments. Awake, but not able to provide a hx. Family at bedside. States that pt has been talking a little more and has said a few phrases.   Inquired about intake prior to admission and they describe appetite as "like a bird." Discussed current nutrition plan and that cortrak would be able to provide nutrition until pt's mentation allowed for PO. SLP following.   Noted some loose BM, will add banatrol to aid in add bulk to stools. Otherwise seems to be tolerating feeds at goal.  Admit weight: 60 kg  Current weight: 69.6 kg    Nutritionally Relevant Medications: Scheduled Meds:  atorvastatin   80 mg Per Tube Daily   PROSource TF20  60 mL Per Tube Daily   insulin  aspart  0-9 Units Subcutaneous Q4H   insulin  glargine-yfgn  5 Units Subcutaneous  BID   thiamine   100 mg Per Tube Daily   Continuous Infusions:  feeding supplement (OSMOLITE 1.5 CAL) 40 mL/hr at 09/30/23 1213   PRN Meds: docusate, polyethylene glycol  Labs Reviewed: BUN 37, creatinine 2.2 CBG ranges from 80-245 mg/dL over the last 24 hours HgbA1c 14.3% (4/30)  NUTRITION - FOCUSED PHYSICAL EXAM: Flowsheet Row Most Recent Value  Orbital Region Severe depletion  Upper Arm Region Unable to assess  Thoracic and Lumbar Region Severe depletion  Buccal Region Unable to assess  Temple Region Severe depletion  Clavicle Bone Region Severe depletion  Clavicle and Acromion Bone Region Severe depletion  Scapular Bone Region Unable to assess  Dorsal Hand Unable to assess  Patellar Region Severe depletion  Anterior Thigh Region Severe depletion  Posterior Calf Region Severe depletion  Edema (RD Assessment) None  Hair Reviewed  Eyes Unable to assess  Mouth Unable to assess  Skin Reviewed  Nails Unable to assess    Diet Order:   Diet Order             Diet NPO time specified  Diet effective now                   EDUCATION NEEDS:  Not appropriate for education at this time  Skin:  Skin Assessment: Reviewed RN Assessment  Last BM:  5/5 - type 7 Via FMS placed 5/5  Height:  Ht Readings from Last 1 Encounters:  09/25/23 5\' 5"  (1.651 m)    Weight:  Wt Readings from Last 1 Encounters:  10/01/23 69.6 kg    Ideal Body Weight:  56.8 kg  BMI:  Body mass index is 25.53 kg/m.  Estimated Nutritional Needs:  Kcal:  1600-1800 Protein:  90-100 grams Fluid:  1.2 L/day    Edwena Graham, RD, LDN Registered Dietitian II Please reach out via secure chat

## 2023-10-01 NOTE — Consult Note (Signed)
 ENT CONSULT:  Reason for Consult: stridor upper airway evaluation   HPI: 73 yoF with IDDM, CKD on the verge of needing hemodialysis, CHF, substance abuse, who was found down with seizure activity 09/24/23 intubated on arrival in ED for airway protection, and transferred to [facility]. She was found to have encephalopathy 2/2 DKA, with blood glucose close to 1000, with suboptimal mental status (agitated and restless, but awake and intermittently following command). She was also treated for RLL PNA with abx. She was extubated and was noted to have noisy breathing. ENT was consulted for upper airway evaluation.    Records Reviewed:  H&P note from admission on 4/29 Seizure from metabolic encephalopathy with positive drug screen for Cocaine and benzos-seen by Neurology appreciate recommendations Acute hypercarbic respiratory failrue-vent management HHS vs DKA-endotool and insulin  protocol , serial fingersticks AKI on CKD-IVF, monitor serum Cr  68 year old female w/ poorly controlled  IDDM, CKD stage IV, HTN, LLD, HFpEF, substance abuse (cocaine) followed by nephrology in [place].  Hx from ED: Daughter found on floor around 1500 actively seizing, CBG read "high". Continued to seize from time of EMS dispatch to arrival to ER. Pt had received 2.5 mg versed  in route. On arrival had right facial twitching, right gaze pref. Received 2 mg IV ativan . After this gaze pref returned to midline w/ snoring respirations, no further twitching, would grimace to noxious stim.  CT head negative for acute bleed, there was left periorbital facial swelling.  MRI neg for acute process per neuro.  Intubated by EDP for airway protection  Given IV keppra  (total 2 gms)  Initial lab wk: Na 129, glucose 989, anion gap 16, wbc 13, CK 121, trop 114, VBG Ph 7.08, pco2 81, bicarb 24, lactate 2.8-->2.9 Post intubation abg: 7.42/29/130/19.2 PCXR ett good position. Chronically elevated right HD R basilar vol loss Transferred to [facility]     Past Medical History:  Diagnosis Date   Asthma    Diabetes mellitus without complication (HCC)    Hypertension     Past Surgical History:  Procedure Laterality Date   TUBAL LIGATION      History reviewed. No pertinent family history.  Social History:  reports that she has quit smoking. Her smoking use included cigarettes. She does not have any smokeless tobacco history on file. She reports that she does not drink alcohol. No history on file for drug use.  Allergies: No Known Allergies  Medications: I have reviewed the patient's current medications.  The PMH, PSH, Medications, Allergies, and SH were reviewed and updated.  ROS: Unable to answer questions 2/2 AMS  Blood pressure (!) 157/69, pulse 100, temperature 98.2 F (36.8 C), temperature source Oral, resp. rate (!) 24, height 5\' 5"  (1.651 m), weight 69.6 kg, SpO2 93%. Body mass index is 25.53 kg/m.  PHYSICAL EXAM:  Exam: General: awake intermittently responds to verbal commands Respiratory Respiratory effort: Equal inspiration and expiration without stridor Cardiovascular Peripheral Vascular: Warm extremities with equal color/perfusion Eyes: No nystagmus with equal extraocular motion bilaterally Neuro/Psych/Balance: exam limited 2/2 altered mental status  Head and Face Inspection: Normocephalic and atraumatic without mass or lesion Facial Strength: Facial motility symmetric and full bilaterally ENT Pinna: External ear intact and fully developed External canal: Canal is patent with intact skin Tympanic Membrane: Clear and mobile External Nose: No scar or anatomic deformity Internal Nose: Septum is relatively straight. No polyp, or purulence. Mucosal edema and erythema present.  Bilateral inferior turbinate hypertrophy. DHT in place R side Lips, Teeth, and  gums: edentulous Oral cavity/oropharynx: No erythema or exudate, no lesions present Nasopharynx: No mass or lesion with intact mucosa Hypopharynx: Intact  mucosa without pooling of secretions Larynx Glottic: limited exam 2/2 patient's altered mental status (not follows commands), but there was no movement seen on the right side (suspect R VF paralysis), left VF with a large hemorrhagic polyp along left vocal fold (ball valve movement of the lesion in and out of the glottis with inspiration.  Supraglottic: Normal appearing epiglottis and AE folds Interarytenoid Space: Moderate pachydermia&edema Subglottic Space: Patent without lesion or edema Neck Neck and Trachea: Midline trachea without mass or lesion Thyroid: No mass or nodularity Lymphatics: No lymphadenopathy  Procedure: Preoperative diagnosis: stridor noisy breathing   Postoperative diagnosis:   Same + R vocal fold paralysis, L vocal fold polyp/mass, narrowing of the glottic aperture   Procedure: Flexible fiberoptic laryngoscopy  Surgeon: Artice Last, MD  Anesthesia: Topical lidocaine  and Afrin Complications: None Condition is stable throughout exam  Indications and consent:  The patient presents to the clinic with above symptoms. Indirect laryngoscopy view was incomplete. Thus it was recommended that they undergo a flexible fiberoptic laryngoscopy. All of the risks, benefits, and potential complications were reviewed with the patient preoperatively and verbal informed consent was obtained.  Procedure: The patient was seated upright in the clinic. Topical lidocaine  and Afrin were applied to the nasal cavity. After adequate anesthesia had occurred, I then proceeded to pass the flexible telescope into the nasal cavity. The nasal cavity was patent without rhinorrhea or polyp. The nasopharynx was also patent without mass or lesion. The base of tongue was visualized and was normal. There were no signs of pooling of secretions in the piriform sinuses. The right true vocal fold was not moving, left vocal fold was mobile, and there was a large left vocal fold polyp/mass, which appeared as  hemorrhagic polyp. There were no signs of glottic or supraglottic mucosal lesion or mass. There was moderate interarytenoid pachydermia and post cricoid edema. The telescope was then slowly withdrawn and the patient tolerated the procedure throughout.    Assessment/Plan: Stridor s/p extubation, faint only when more active  Encephalopathy in the setting of seizure likely 2/2 DKA (glucose ~ 1000 on arrival) Altered mental status    Flexible laryngoscopy today with no movement seen on the right side (suspect R VF paralysis), left VF with a large hemorrhagic polyp along left vocal fold (ball valve movement of the lesion in and out of the glottis with inspiration. These findings could explain intermittent stridor.   On my evaluation today, there was no noisy breathing and I suspect the patient only gets stridor when she is more active/with exertion. Unclear etiology for R VF paralysis, this would typically require CT neck and chest with contrast to evaluate the entire course of the recurrent laryngeal nerve, but due to hx of CKD and a need for hemodialysis during this admission patient is not able to get contrast. Due to suspected R VF paralysis and evidence of a bulky polyp on the left true VF (which could represent a hemorrhagic polyp 2/2 intubation trauma or other etiology such as phonotrauma), patient would benefit from VF polyp removal to provide better glottic opening and to help with breathing, particularly as she is expected to progress with her mental status recovery per primary team.   Discussed with primary team and per primary team, patient is safe for general anesthesia. Will hold off on CT neck/chest until repeat scope exam can be done after  patient's mental status improves to confirm that R VF is paralyzed. Will consider non-con CT neck and chest if needed.   - please hold tube feeds at midnight - patient is scheduled for DML, bronch and CO2 laser excision of the left VF polyp tomorrow in  am  - will obtain consent from family via phone   Thank you for allowing me to participate in the care of this patient. Please do not hesitate to contact me with any questions or concerns.   Artice Last, MD Otolaryngology [facility] Phone: [phone] Fax: [phone]    10/01/2023, 1:49 PM

## 2023-10-01 NOTE — Progress Notes (Signed)
 Inpatient Rehab Admissions Coordinator:   Per therapy recommendations pt was screened for CIR by Loye Rumble, PT, DPT.  Max/total +2 on eval.  Will follow for next therapy session to gauge tolerance/progress in order to assess candidacy.    Loye Rumble, PT, DPT Admissions Coordinator [phone] 10/01/23  10:43 AM

## 2023-10-01 NOTE — Progress Notes (Signed)
 Patient ID: Lori Gates, female   DOB: 1956/01/21, 68 y.o.   MRN: [id number] [facility] Progress Note   Assessment/ Plan:   1. Acute kidney Injury on chronic kidney disease stage IV: Baseline creatinine ranging 2.5-2.8.  She has surprisingly stable labs (both BUN and creatinine) without any acute electrolyte abnormality prompting the need for dialysis.  Her urine output remains barely nonoliguric and I will start her today on diuretics to see if we can augment urine output and avoid need for additional dialysis.  Son updated at bedside. 2.  Diabetic ketoacidosis: Initially with coma/altered mental status that has improved with insulin  therapy/fluids. 3.  Acute metabolic encephalopathy: Appears to be bifactorial from diabetic ketoacidosis and cocaine abuse.  Mental status gradually improving with correction of diabetic ketoacidosis.  Previously with seizure for which she is now on Keppra . 4.  Hypertension: Elevated blood pressure noted, monitor on amlodipine  and with initiation of diuretics.  Subjective:   Without acute events overnight, son at bedside updated regarding clinical progress.   Objective:   BP (!) 160/92 (BP Location: Right Arm)   Pulse (!) 143   Temp 98.3 F (36.8 C) (Axillary)   Resp 18   Ht 5\' 5"  (1.651 m)   Wt 69.6 kg   SpO2 93%   BMI 25.53 kg/m   Intake/Output Summary (Last 24 hours) at 10/01/2023 1032 Last data filed at 10/01/2023 0300 Gross per 24 hour  Intake 662 ml  Output 350 ml  Net 312 ml   Weight change: 4 kg  Physical Exam: Gen: Comfortably resting in bed, somnolent. CVS: Pulse regular rhythm, normal rate, S1 and S2 normal Resp: Clear to auscultation bilaterally, no rales/rhonchi Abd: Soft, obese, nontender, bowel sounds normal Ext: 2+ lower extremity edema, 2+ upper extremity edema.  Left brachiobasilic fistula palpable  Imaging: DG Chest Port 1 View Result Date: 09/29/2023 CLINICAL DATA:  Acute respiratory failure EXAM: PORTABLE  CHEST 1 VIEW COMPARISON:  09/26/2023 FINDINGS: Stable cardiomediastinal silhouette. Aortic atherosclerotic calcification. Streaky airspace opacities in the bilateral lower lungs. Retrocardiac consolidation. Question layering bilateral pleural effusions. No pneumothorax. Right IJ CVC tip in the mid SVC. IMPRESSION: Streaky airspace opacities in the bilateral lower lungs and retrocardiac consolidation, atelectasis versus pneumonia. Question layering bilateral pleural effusions. Electronically Signed   By: Rozell Cornet M.D.   On: 09/29/2023 22:43    Labs: BMET Recent Labs  Lab 09/25/23 2317 09/26/23 0622 09/27/23 0600 09/28/23 0721 09/29/23 0505 09/30/23 0631 10/01/23 0549  NA 140 138 140 138 139 140 140  K 5.0 5.7* 4.1 3.0* 4.1 3.9 4.0  CL 105 106 106 107 99 106 107  CO2 22 21* 24 22 23 23 24   GLUCOSE 138* 132* 173* 44* 318* 107* 268*  BUN 20 21 29* 23 35* 34* 37*  CREATININE 2.81* 2.90* 2.89* 1.92* 2.34* 2.23* 2.20*  CALCIUM  8.4* 8.2* 7.9* 7.9* 8.1* 8.0* 8.2*  PHOS  --   --  2.3* 2.2* 4.2 4.3  --    CBC Recent Labs  Lab 09/24/23 1534 09/25/23 0218 09/25/23 0447 09/25/23 0955 09/27/23 0600 09/28/23 0721 09/30/23 0911  WBC 13.0*  --  8.6 6.2 10.9* 5.9 4.8  NEUTROABS 11.4*  --  6.3  --   --   --  3.5  HGB 12.0   < > 12.7 11.3* 8.8* 9.7* 8.5*  HCT 38.7   < > 38.2 32.8* 26.5* 28.9* 26.7*  MCV 100.0  --  94.3 93.2 95.3 92.6 95.7  PLT 277  --  235 138* 160 161 188   < > = values in this interval not displayed.    Medications:     amLODipine   5 mg Per Tube Daily   atorvastatin   80 mg Per Tube Daily   Chlorhexidine  Gluconate Cloth  6 each Topical Daily   clonazePAM  0.5 mg Per Tube BID   feeding supplement (PROSource TF20)  60 mL Per Tube Daily   heparin   5,000 Units Subcutaneous Q8H   insulin  aspart  0-9 Units Subcutaneous Q4H   insulin  glargine-yfgn  5 Units Subcutaneous BID   ipratropium-albuterol   3 mL Nebulization QID   levETIRAcetam   500 mg Per Tube BID    thiamine   100 mg Per Tube Daily    Clevester Dally, MD 10/01/2023, 10:32 AM

## 2023-10-01 NOTE — Progress Notes (Signed)
 PT Cancellation Note  Patient Details Name: Lori Gates MRN: [id number] DOB: 1955/11/01   Cancelled Treatment:    Reason Eval/Treat Not Completed: Medical issues which prohibited therapy (pt tachycardic and currently getting EKG and ordered cardizem. Will follow up at later date/time as pt is able and schedule allows.)    Corbin Dess PT, DPT Acute Rehabilitation Services Office [phone]  10/01/23 10:52 AM

## 2023-10-01 NOTE — Inpatient Diabetes Management (Signed)
 Inpatient Diabetes Program Recommendations  AACE/ADA: New Consensus Statement on Inpatient Glycemic Control (2015)  Target Ranges:  Prepandial:   less than 140 mg/dL      Peak postprandial:   less than 180 mg/dL (1-2 hours)      Critically ill patients:  140 - 180 mg/dL   Lab Results  Component Value Date   GLUCAP 258 (H) 10/01/2023   HGBA1C 14.3 (H) 09/25/2023    Review of Glycemic Control  Latest Reference Range & Units 09/30/23 20:50 09/30/23 23:43 10/01/23 04:22 10/01/23 08:07 10/01/23 12:56  Glucose-Capillary 70 - 99 mg/dL 161 (H) 096 (H) 045 (H) 228 (H) 258 (H)  (H): Data is abnormally high  Diabetes history: DM2 Outpatient Diabetes medications:  Lantus  20 units every day Humalog 13 units TID Rybelsus 6 mg BID Current orders for Inpatient glycemic control:  Semglee  5 units BID, Novolog  0-9 units Q4H, Vital 1.5 @ 40 ml/hr  Inpatient Diabetes Program Recommendations:    Might consider:  Novolog  3 units Q4H tube feeds coverage (hold if feeds are held or discontinued).  Will continue to follow while inpatient.  Thank you, Hays Lipschutz, MSN, CDCES Diabetes Coordinator Inpatient Diabetes Program [phone] (team pager from 8a-5p)

## 2023-10-01 NOTE — Anesthesia Preprocedure Evaluation (Signed)
 Anesthesia Evaluation  Patient identified by MRN, date of birth, ID band Patient awake    Reviewed: Allergy & Precautions, H&P , NPO status , Patient's Chart, lab work & pertinent test results  Airway Mallampati: II  TM Distance: >3 FB Neck ROM: Full    Dental no notable dental hx. (+) Edentulous Upper, Edentulous Lower   Pulmonary asthma , former smoker AHRF on admission likely 2/2 aspiration PNA following seizure. Extubated, now on room air   Pulmonary exam normal breath sounds clear to auscultation       Cardiovascular hypertension, +CHF  Normal cardiovascular exam+ dysrhythmias Atrial Fibrillation  Rhythm:Regular Rate:Normal     Neuro/Psych Seizures -,   negative psych ROS   GI/Hepatic negative GI ROS,,,(+)     substance abuse  cocaine use  Endo/Other  diabetes, Type 2    Renal/GU CRFRenal disease  negative genitourinary   Musculoskeletal negative musculoskeletal ROS (+)    Abdominal   Peds negative pediatric ROS (+)  Hematology negative hematology ROS (+)   Anesthesia Other Findings Vocal cord paralysis Vocal cord polyp    Reproductive/Obstetrics negative OB ROS                             Anesthesia Physical Anesthesia Plan  ASA: 4  Anesthesia Plan: General   Post-op Pain Management:    Induction: Intravenous  PONV Risk Score and Plan: 3 and Ondansetron , Dexamethasone and Midazolam   Airway Management Planned: Oral ETT and Video Laryngoscope Planned  Additional Equipment: None  Intra-op Plan:   Post-operative Plan: Extubation in OR  Informed Consent: I have reviewed the patients History and Physical, chart, labs and discussed the procedure including the risks, benefits and alternatives for the proposed anesthesia with the patient or authorized representative who has indicated his/her understanding and acceptance.     Dental advisory given  Plan Discussed with:  CRNA  Anesthesia Plan Comments: (PMH of HTN, HFpEF, substance abuse with cocaine, poorly controlled diabetes, CKD stage IV present to the hospital with complaints of seizures.  Blood sugar was also high and was found to have DKA. Initial admit to the ICU.  Required intubation.. Transferred out of the ICU on 5/6. ENT consulted on 5/6 for stridor.  Was found to have vocal cord polyps and paralysis. )        Anesthesia Quick Evaluation

## 2023-10-01 NOTE — Progress Notes (Addendum)
 [facility] Progress Note Patient: Lori Gates JXB:[id number] DOB: 11-04-1955 DOA: 09/24/2023  DOS: the patient was seen and examined on 10/01/2023  Brief Hospital Course: PMH of HTN, HFpEF, substance abuse with cocaine, poorly controlled diabetes, CKD stage IV present to the hospital with complaints of seizures.  Blood sugar was also high and was found to have DKA. Initial admit to the ICU.  Required intubation.. Transferred out of the ICU on 5/6. ENT consulted on 5/6 for stridor.  Was found to have vocal cord polyps and paralysis. Assessment and Plan: Seizure with post-ictal state Likely in the setting of DKA with blood sugar close to 1000 Neurology is following Continue Keppra  for now, she may not need follow-up as seizures    Acute hypoxic/hypercapnic respiratory failure Right lower lobe pneumonia likely aspiration Extubated on 5/4. Developed stridor after this. Received racemic epi Was also on BiPAP. Transferred to the progressive care unit. BiPAP was held as the patient had mittens. Found to have stridor again.  1 dose of Solu-Medrol was given.  Racemic epi was held as the patient had A-fib with RVR. Completed 7-day course of ceftriaxone  PCCM following.  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 Appreciate ENT consultation. Patient is currently scheduled for surgery on 5/7. N.p.o. after midnight. Unable to perform CT scan chest Abdo pelvis with contrast to look for hidden malignancy given renal dysfunction. Per PCCM should be able to tolerate anesthesia.   Poorly controlled diabetes, presented with DKA with coma DKA has resolved Hemoglobin A1c is 14 Continue sliding scale insulin .  Will reduce the dose of the long-acting insulin  given plan for surgery tomorrow.   AKI on CKD 4, requiring hemodialysis LUE AVF placed in 2024 Received dialysis on Friday for the first time Nephrology is following, Currently monitoring without HD. Initiating  diuresis.  Acute metabolic encephalopathy secondary to DKA Acute toxic encephalopathy in the setting of cocaine abuse Patient remains off sedation, she is confused but awake and following commands No signs of withdrawal DKA has been corrected TSH elevated but free T4 normal.  Likely sick euthyroid syndrome.  Demand cardiac ischemia in the setting of acute infection and seizures Troponin peaked at 230. Echocardiogram showed no wall motion abnormalities  Paroxysmal A-fib with RVR. Developed RVR on 5/6.  Found to have A-fib. Given Cardizem bolus followed by Cardizem infusion. Pulmonary discontinue the Cardizem. Patient converted back to sinus rhythm. Currently on Lopressor. Dose increased.  Recent echocardiogram unremarkable.  Given brief presentation of the A-fib, holding off on anticoagulation unless reoccurs.   LUE superficial venous thrombosis Continue warm compress  HTN Continue amlodipine  to 5 mg  HLD Continue statin  HFpEF Chest x-ray shows vascular congestion. Initiating torsemide. Will monitor  Severe protein calorie malnutrition Body mass index is 25.53 kg/m.  Failed swallow evaluation Remains NPO.  Has Cortrak. On tube feed.  Tube feed currently on hold after midnight for surgery tomorrow.  Subjective: Able to answer questions.  Able to follow commands.  That She Has Some Shortness of Breath.  No Nausea No Vomiting.  No Pain Reported.  Physical Exam: General: in moderate distress, No Rash Cardiovascular: S1 and S2 Present, No Murmur Respiratory: Good respiratory effort, Bilateral Air entry present. No Crackles,, upper airway stridor heard Abdomen: Bowel Sound present, No tenderness Extremities: No edema Neuro: Alert and oriented x3, no new focal deficit  Data Reviewed: I have Reviewed nursing notes, Vitals, and Lab results. Since last encounter, pertinent lab results CBC and BMP   .  I have ordered test including CBC and BMP  . I have discussed pt's care  plan and test results with ENT, nephrology and pulmonary  . I have ordered imaging chest x-ray  .   Disposition: Status is: Inpatient Remains inpatient appropriate because: Will be undergoing surgery tomorrow for vocal cord polyp.  Will require further workup and improvement after that.  heparin  injection 5,000 Units Start: 09/25/23 0600   Family Communication: Family at bedside Level of care: Progressive   Vitals:   10/01/23 1024 10/01/23 1223 10/01/23 1256 10/01/23 1700  BP: (!) 160/92 (!) 157/69  (!) 162/72  Pulse: (!) 143 100    Resp:  (!) 24  18  Temp: 98.3 F (36.8 C)  98.2 F (36.8 C) 99 F (37.2 C)  TempSrc: Axillary  Oral Oral  SpO2: 93% 93%    Weight:      Height:        The patient is critically ill with multiple organ systems failure and requires high complexity decision making for assessment and support, frequent evaluation and titration of therapies. Critical Care Time devoted to patient care services described in this note is 35 minutes   Author: Charlean Congress, MD 10/01/2023 8:01 PM  Please look on [email or web address] to find out who is on call.

## 2023-10-01 NOTE — H&P (View-Only) (Signed)
 ENT CONSULT:  Reason for Consult: stridor upper airway evaluation   HPI: 1 yoF with IDDM, CKD on the verge of needing hemodialysis, CHF, substance abuse, who was found down with seizure activity 09/24/23 intubated on arrival in ED for airway protection, and transferred to [facility]. She was found to have encephalopathy 2/2 DKA, with blood glucose close to 1000, with suboptimal mental status (agitated and restless, but awake and intermittently following command). She was also treated for RLL PNA with abx. She was extubated and was noted to have noisy breathing. ENT was consulted for upper airway evaluation.    Records Reviewed:  H&P note from admission on 4/29 Seizure from metabolic encephalopathy with positive drug screen for Cocaine and benzos-seen by Neurology appreciate recommendations Acute hypercarbic respiratory failrue-vent management HHS vs DKA-endotool and insulin  protocol , serial fingersticks AKI on CKD-IVF, monitor serum Cr  68 year old female w/ poorly controlled  IDDM, CKD stage IV, HTN, LLD, HFpEF, substance abuse (cocaine) followed by nephrology in [place].  Hx from ED: Daughter found on floor around 1500 actively seizing, CBG read "high". Continued to seize from time of EMS dispatch to arrival to ER. Pt had received 2.5 mg versed  in route. On arrival had right facial twitching, right gaze pref. Received 2 mg IV ativan . After this gaze pref returned to midline w/ snoring respirations, no further twitching, would grimace to noxious stim.  CT head negative for acute bleed, there was left periorbital facial swelling.  MRI neg for acute process per neuro.  Intubated by EDP for airway protection  Given IV keppra  (total 2 gms)  Initial lab wk: Na 129, glucose 989, anion gap 16, wbc 13, CK 121, trop 114, VBG Ph 7.08, pco2 81, bicarb 24, lactate 2.8-->2.9 Post intubation abg: 7.42/29/130/19.2 PCXR ett good position. Chronically elevated right HD R basilar vol loss Transferred to [facility]     Past Medical History:  Diagnosis Date   Asthma    Diabetes mellitus without complication (HCC)    Hypertension     Past Surgical History:  Procedure Laterality Date   TUBAL LIGATION      History reviewed. No pertinent family history.  Social History:  reports that she has quit smoking. Her smoking use included cigarettes. She does not have any smokeless tobacco history on file. She reports that she does not drink alcohol. No history on file for drug use.  Allergies: No Known Allergies  Medications: I have reviewed the patient's current medications.  The PMH, PSH, Medications, Allergies, and SH were reviewed and updated.  ROS: Unable to answer questions 2/2 AMS  Blood pressure (!) 157/69, pulse 100, temperature 98.2 F (36.8 C), temperature source Oral, resp. rate (!) 24, height 5\' 5"  (1.651 m), weight 69.6 kg, SpO2 93%. Body mass index is 25.53 kg/m.  PHYSICAL EXAM:  Exam: General: awake intermittently responds to verbal commands Respiratory Respiratory effort: Equal inspiration and expiration without stridor Cardiovascular Peripheral Vascular: Warm extremities with equal color/perfusion Eyes: No nystagmus with equal extraocular motion bilaterally Neuro/Psych/Balance: exam limited 2/2 altered mental status  Head and Face Inspection: Normocephalic and atraumatic without mass or lesion Facial Strength: Facial motility symmetric and full bilaterally ENT Pinna: External ear intact and fully developed External canal: Canal is patent with intact skin Tympanic Membrane: Clear and mobile External Nose: No scar or anatomic deformity Internal Nose: Septum is relatively straight. No polyp, or purulence. Mucosal edema and erythema present.  Bilateral inferior turbinate hypertrophy. DHT in place R side Lips, Teeth, and  gums: edentulous Oral cavity/oropharynx: No erythema or exudate, no lesions present Nasopharynx: No mass or lesion with intact mucosa Hypopharynx: Intact  mucosa without pooling of secretions Larynx Glottic: limited exam 2/2 patient's altered mental status (not follows commands), but there was no movement seen on the right side (suspect R VF paralysis), left VF with a large hemorrhagic polyp along left vocal fold (ball valve movement of the lesion in and out of the glottis with inspiration.  Supraglottic: Normal appearing epiglottis and AE folds Interarytenoid Space: Moderate pachydermia&edema Subglottic Space: Patent without lesion or edema Neck Neck and Trachea: Midline trachea without mass or lesion Thyroid: No mass or nodularity Lymphatics: No lymphadenopathy  Procedure: Preoperative diagnosis: stridor noisy breathing   Postoperative diagnosis:   Same + R vocal fold paralysis, L vocal fold polyp/mass, narrowing of the glottic aperture   Procedure: Flexible fiberoptic laryngoscopy  Surgeon: Artice Last, MD  Anesthesia: Topical lidocaine  and Afrin Complications: None Condition is stable throughout exam  Indications and consent:  The patient presents to the clinic with above symptoms. Indirect laryngoscopy view was incomplete. Thus it was recommended that they undergo a flexible fiberoptic laryngoscopy. All of the risks, benefits, and potential complications were reviewed with the patient preoperatively and verbal informed consent was obtained.  Procedure: The patient was seated upright in the clinic. Topical lidocaine  and Afrin were applied to the nasal cavity. After adequate anesthesia had occurred, I then proceeded to pass the flexible telescope into the nasal cavity. The nasal cavity was patent without rhinorrhea or polyp. The nasopharynx was also patent without mass or lesion. The base of tongue was visualized and was normal. There were no signs of pooling of secretions in the piriform sinuses. The right true vocal fold was not moving, left vocal fold was mobile, and there was a large left vocal fold polyp/mass, which appeared as  hemorrhagic polyp. There were no signs of glottic or supraglottic mucosal lesion or mass. There was moderate interarytenoid pachydermia and post cricoid edema. The telescope was then slowly withdrawn and the patient tolerated the procedure throughout.    Assessment/Plan: Stridor s/p extubation, faint only when more active  Encephalopathy in the setting of seizure likely 2/2 DKA (glucose ~ 1000 on arrival) Altered mental status    Flexible laryngoscopy today with no movement seen on the right side (suspect R VF paralysis), left VF with a large hemorrhagic polyp along left vocal fold (ball valve movement of the lesion in and out of the glottis with inspiration. These findings could explain intermittent stridor.   On my evaluation today, there was no noisy breathing and I suspect the patient only gets stridor when she is more active/with exertion. Unclear etiology for R VF paralysis, this would typically require CT neck and chest with contrast to evaluate the entire course of the recurrent laryngeal nerve, but due to hx of CKD and a need for hemodialysis during this admission patient is not able to get contrast. Due to suspected R VF paralysis and evidence of a bulky polyp on the left true VF (which could represent a hemorrhagic polyp 2/2 intubation trauma or other etiology such as phonotrauma), patient would benefit from VF polyp removal to provide better glottic opening and to help with breathing, particularly as she is expected to progress with her mental status recovery per primary team.   Discussed with primary team and per primary team, patient is safe for general anesthesia. Will hold off on CT neck/chest until repeat scope exam can be done after  patient's mental status improves to confirm that R VF is paralyzed. Will consider non-con CT neck and chest if needed.   - please hold tube feeds at midnight - patient is scheduled for DML, bronch and CO2 laser excision of the left VF polyp tomorrow in  am  - will obtain consent from family via phone   Thank you for allowing me to participate in the care of this patient. Please do not hesitate to contact me with any questions or concerns.   Artice Last, MD Otolaryngology [facility] Phone: [phone] Fax: [phone]    10/01/2023, 1:49 PM

## 2023-10-02 ENCOUNTER — Inpatient Hospital Stay (HOSPITAL_COMMUNITY): Payer: Self-pay

## 2023-10-02 ENCOUNTER — Encounter (HOSPITAL_COMMUNITY)
Admission: EM | Disposition: A | Discharge status: Skilled Nursing Facility | Payer: Self-pay | Source: Home / Self Care | Attending: Internal Medicine

## 2023-10-02 ENCOUNTER — Inpatient Hospital Stay (HOSPITAL_COMMUNITY): Payer: Medicare (Managed Care)

## 2023-10-02 ENCOUNTER — Other Ambulatory Visit: Payer: Self-pay

## 2023-10-02 DIAGNOSIS — J45909 Unspecified asthma, uncomplicated: Secondary | ICD-10-CM | POA: Diagnosis not present

## 2023-10-02 DIAGNOSIS — I5032 Chronic diastolic (congestive) heart failure: Secondary | ICD-10-CM

## 2023-10-02 DIAGNOSIS — J690 Pneumonitis due to inhalation of food and vomit: Secondary | ICD-10-CM | POA: Diagnosis not present

## 2023-10-02 DIAGNOSIS — E119 Type 2 diabetes mellitus without complications: Secondary | ICD-10-CM

## 2023-10-02 DIAGNOSIS — I4891 Unspecified atrial fibrillation: Secondary | ICD-10-CM | POA: Diagnosis not present

## 2023-10-02 DIAGNOSIS — J383 Other diseases of vocal cords: Secondary | ICD-10-CM

## 2023-10-02 DIAGNOSIS — J9601 Acute respiratory failure with hypoxia: Secondary | ICD-10-CM | POA: Diagnosis not present

## 2023-10-02 DIAGNOSIS — E1110 Type 2 diabetes mellitus with ketoacidosis without coma: Secondary | ICD-10-CM

## 2023-10-02 DIAGNOSIS — D141 Benign neoplasm of larynx: Secondary | ICD-10-CM

## 2023-10-02 DIAGNOSIS — J9602 Acute respiratory failure with hypercapnia: Secondary | ICD-10-CM | POA: Diagnosis not present

## 2023-10-02 DIAGNOSIS — I480 Paroxysmal atrial fibrillation: Secondary | ICD-10-CM

## 2023-10-02 DIAGNOSIS — R569 Unspecified convulsions: Secondary | ICD-10-CM | POA: Diagnosis not present

## 2023-10-02 HISTORY — PX: RIGID BRONCHOSCOPY: SHX5069

## 2023-10-02 HISTORY — PX: MICROLARYNGOSCOPY WITH LASER: SHX5972

## 2023-10-02 LAB — POCT I-STAT 7, (LYTES, BLD GAS, ICA,H+H)
Acid-Base Excess: 1 mmol/L (ref 0.0–2.0)
Bicarbonate: 25.7 mmol/L (ref 20.0–28.0)
Calcium, Ion: 1.15 mmol/L (ref 1.15–1.40)
HCT: 26 % — ABNORMAL LOW (ref 36.0–46.0)
Hemoglobin: 8.8 g/dL — ABNORMAL LOW (ref 12.0–15.0)
O2 Saturation: 98 %
Patient temperature: 97.8
Potassium: 4 mmol/L (ref 3.5–5.1)
Sodium: 142 mmol/L (ref 135–145)
TCO2: 27 mmol/L (ref 22–32)
pCO2 arterial: 37.2 mmHg (ref 32–48)
pH, Arterial: 7.445 (ref 7.35–7.45)
pO2, Arterial: 100 mmHg (ref 83–108)

## 2023-10-02 LAB — RENAL FUNCTION PANEL
Albumin: 2.1 g/dL — ABNORMAL LOW (ref 3.5–5.0)
Anion gap: 10 (ref 5–15)
BUN: 37 mg/dL — ABNORMAL HIGH (ref 8–23)
CO2: 27 mmol/L (ref 22–32)
Calcium: 8.5 mg/dL — ABNORMAL LOW (ref 8.9–10.3)
Chloride: 104 mmol/L (ref 98–111)
Creatinine, Ser: 2.33 mg/dL — ABNORMAL HIGH (ref 0.44–1.00)
GFR, Estimated: 22 mL/min — ABNORMAL LOW (ref >60)
Glucose, Bld: 225 mg/dL — ABNORMAL HIGH (ref 70–99)
Phosphorus: 2.4 mg/dL — ABNORMAL LOW (ref 2.5–4.6)
Potassium: 3.9 mmol/L (ref 3.5–5.1)
Sodium: 141 mmol/L (ref 135–145)

## 2023-10-02 LAB — GLUCOSE, CAPILLARY
Glucose-Capillary: 219 mg/dL — ABNORMAL HIGH (ref 70–99)
Glucose-Capillary: 175 mg/dL — ABNORMAL HIGH (ref 70–99)
Glucose-Capillary: 214 mg/dL — ABNORMAL HIGH (ref 70–99)
Glucose-Capillary: 175 mg/dL — ABNORMAL HIGH (ref 70–99)
Glucose-Capillary: 177 mg/dL — ABNORMAL HIGH (ref 70–99)
Glucose-Capillary: 173 mg/dL — ABNORMAL HIGH (ref 70–99)
Glucose-Capillary: 170 mg/dL — ABNORMAL HIGH (ref 70–99)
Glucose-Capillary: 193 mg/dL — ABNORMAL HIGH (ref 70–99)
Glucose-Capillary: 163 mg/dL — ABNORMAL HIGH (ref 70–99)

## 2023-10-02 SURGERY — MICROLARYNGOSCOPY, WITH PROCEDURE USING LASER
Anesthesia: General

## 2023-10-02 MED ORDER — NALOXONE HCL 0.4 MG/ML IJ SOLN
INTRAMUSCULAR | Status: DC | PRN
  Administered 2023-10-02 (×2): 200 ug via INTRAVENOUS

## 2023-10-02 MED ORDER — FENTANYL CITRATE PF 50 MCG/ML IJ SOSY: 25.0000 ug | PREFILLED_SYRINGE | INTRAMUSCULAR | Status: DC | PRN

## 2023-10-02 MED ORDER — PROPOFOL 1000 MG/100ML IV EMUL
0.0000 ug/kg/min | INTRAVENOUS | Status: DC
  Administered 2023-10-02: 5 ug/kg/min via INTRAVENOUS
  Filled 2023-10-02: qty 100

## 2023-10-02 MED ORDER — PROPOFOL 10 MG/ML IV BOLUS
INTRAVENOUS | Status: AC
  Filled 2023-10-02: qty 20

## 2023-10-02 MED ORDER — DEXAMETHASONE SODIUM PHOSPHATE 10 MG/ML IJ SOLN
INTRAMUSCULAR | Status: AC
Start: 2023-10-02 — End: ?
  Filled 2023-10-02: qty 1

## 2023-10-02 MED ORDER — SODIUM CHLORIDE 0.9 % IV SOLN: INTRAVENOUS | Status: DC | PRN

## 2023-10-02 MED ORDER — INSULIN ASPART 100 UNIT/ML IJ SOLN
INTRAMUSCULAR | Status: AC
  Administered 2023-10-02: 4 [IU] via SUBCUTANEOUS
  Filled 2023-10-02: qty 1

## 2023-10-02 MED ORDER — SUGAMMADEX SODIUM 200 MG/2ML IV SOLN
INTRAVENOUS | Status: DC | PRN
  Administered 2023-10-02: 200 mg via INTRAVENOUS

## 2023-10-02 MED ORDER — PHENYLEPHRINE 80 MCG/ML (10ML) SYRINGE FOR IV PUSH (FOR BLOOD PRESSURE SUPPORT)
PREFILLED_SYRINGE | INTRAVENOUS | Status: AC
  Filled 2023-10-02: qty 10

## 2023-10-02 MED ORDER — PROPOFOL 10 MG/ML IV BOLUS
INTRAVENOUS | Status: DC | PRN
  Administered 2023-10-02: 30 ug/kg/min via INTRAVENOUS
  Administered 2023-10-02: 60 mg via INTRAVENOUS

## 2023-10-02 MED ORDER — ROCURONIUM BROMIDE 10 MG/ML (PF) SYRINGE
PREFILLED_SYRINGE | INTRAVENOUS | Status: DC | PRN
  Administered 2023-10-02: 50 mg via INTRAVENOUS

## 2023-10-02 MED ORDER — SUCCINYLCHOLINE CHLORIDE 200 MG/10ML IV SOSY
PREFILLED_SYRINGE | INTRAVENOUS | Status: DC | PRN
Start: 2023-10-02 — End: 2023-10-02
  Administered 2023-10-02: 40 mg via INTRAVENOUS

## 2023-10-02 MED ORDER — FENTANYL CITRATE (PF) 250 MCG/5ML IJ SOLN
INTRAMUSCULAR | Status: AC
  Filled 2023-10-02: qty 5

## 2023-10-02 MED ORDER — INSULIN ASPART 100 UNIT/ML IJ SOLN: 0.0000 [IU] | INTRAMUSCULAR | Status: DC | PRN

## 2023-10-02 MED ORDER — ONDANSETRON HCL 4 MG/2ML IJ SOLN
INTRAMUSCULAR | Status: AC
  Filled 2023-10-02: qty 2

## 2023-10-02 MED ORDER — ONDANSETRON HCL 4 MG/2ML IJ SOLN
INTRAMUSCULAR | Status: DC | PRN
  Administered 2023-10-02: 4 mg via INTRAVENOUS

## 2023-10-02 MED ORDER — LIDOCAINE 2% (20 MG/ML) 5 ML SYRINGE
INTRAMUSCULAR | Status: DC | PRN
  Administered 2023-10-02: 60 mg via INTRAVENOUS

## 2023-10-02 MED ORDER — ORAL CARE MOUTH RINSE: 15.0000 mL | OROMUCOSAL | Status: DC | PRN

## 2023-10-02 MED ORDER — ORAL CARE MOUTH RINSE
15.0000 mL | OROMUCOSAL | Status: DC
  Administered 2023-10-02 – 2023-10-03 (×5): 15 mL via OROMUCOSAL

## 2023-10-02 MED ORDER — POLYETHYLENE GLYCOL 3350 17 G PO PACK
17.0000 g | PACK | Freq: Every day | ORAL | Status: DC
  Administered 2023-10-02: 17 g
  Filled 2023-10-02: qty 1

## 2023-10-02 MED ORDER — OXYMETAZOLINE HCL 0.05 % NA SOLN
NASAL | Status: AC
Start: 2023-10-02 — End: ?
  Filled 2023-10-02: qty 30

## 2023-10-02 MED ORDER — ROCURONIUM BROMIDE 10 MG/ML (PF) SYRINGE
PREFILLED_SYRINGE | INTRAVENOUS | Status: AC
  Filled 2023-10-02: qty 10

## 2023-10-02 MED ORDER — FENTANYL CITRATE (PF) 250 MCG/5ML IJ SOLN
INTRAMUSCULAR | Status: DC | PRN
  Administered 2023-10-02: 100 ug via INTRAVENOUS

## 2023-10-02 MED ORDER — LIDOCAINE 2% (20 MG/ML) 5 ML SYRINGE
INTRAMUSCULAR | Status: AC
  Filled 2023-10-02: qty 5

## 2023-10-02 MED ORDER — DEXAMETHASONE SODIUM PHOSPHATE 10 MG/ML IJ SOLN
INTRAMUSCULAR | Status: DC | PRN
  Administered 2023-10-02: 10 mg via INTRAVENOUS

## 2023-10-02 MED ORDER — EPINEPHRINE HCL (NASAL) 0.1 % NA SOLN
NASAL | Status: AC
  Filled 2023-10-02: qty 30

## 2023-10-02 MED ORDER — OXYMETAZOLINE HCL 0.05 % NA SOLN
NASAL | Status: DC | PRN
  Administered 2023-10-02: 1 via TOPICAL

## 2023-10-02 MED ORDER — EPINEPHRINE HCL (NASAL) 0.1 % NA SOLN
NASAL | Status: DC | PRN
  Administered 2023-10-02: 10 mL via NASAL

## 2023-10-02 MED ORDER — PHENYLEPHRINE 80 MCG/ML (10ML) SYRINGE FOR IV PUSH (FOR BLOOD PRESSURE SUPPORT)
PREFILLED_SYRINGE | INTRAVENOUS | Status: DC | PRN
  Administered 2023-10-02: 80 ug via INTRAVENOUS
  Administered 2023-10-02: 160 ug via INTRAVENOUS

## 2023-10-02 MED ORDER — INSULIN ASPART 100 UNIT/ML IJ SOLN: 0.0000 [IU] | INTRAMUSCULAR | Status: DC

## 2023-10-02 MED ORDER — DEXAMETHASONE SODIUM PHOSPHATE 10 MG/ML IJ SOLN
INTRAMUSCULAR | Status: AC
  Filled 2023-10-02: qty 1

## 2023-10-02 MED ORDER — DOCUSATE SODIUM 50 MG/5ML PO LIQD
100.0000 mg | Freq: Two times a day (BID) | ORAL | Status: DC
  Administered 2023-10-02 (×2): 100 mg
  Filled 2023-10-02 (×3): qty 10

## 2023-10-02 MED ORDER — SODIUM CHLORIDE (PF) 0.9 % IJ SOLN
INTRAMUSCULAR | Status: AC
Start: 2023-10-02 — End: ?
  Filled 2023-10-02: qty 20

## 2023-10-02 MED ORDER — PANTOPRAZOLE SODIUM 40 MG IV SOLR
40.0000 mg | Freq: Every day | INTRAVENOUS | Status: DC
  Administered 2023-10-02 – 2023-10-04 (×3): 40 mg via INTRAVENOUS
  Filled 2023-10-02 (×3): qty 10

## 2023-10-02 SURGICAL SUPPLY — 21 items
BAG COUNTER SPONGE SURGICOUNT (BAG) ×3 IMPLANT
CANISTER SUCT 3000ML PPV (MISCELLANEOUS) ×3 IMPLANT
CNTNR URN SCR LID CUP LEK RST (MISCELLANEOUS) IMPLANT
COVER BACK TABLE 60X90IN (DRAPES) ×3 IMPLANT
COVER MAYO STAND STRL (DRAPES) ×3 IMPLANT
DRAPE HALF SHEET 40X57 (DRAPES) ×3 IMPLANT
GAUZE 4X4 16PLY ~~LOC~~+RFID DBL (SPONGE) ×3 IMPLANT
GLOVE BIO SURGEON STRL SZ 6 (GLOVE) ×3 IMPLANT
GOWN STRL REUS W/ TWL LRG LVL3 (GOWN DISPOSABLE) ×3 IMPLANT
GUARD TEETH (MISCELLANEOUS) ×3 IMPLANT
KIT TURNOVER KIT B (KITS) ×3 IMPLANT
NDL HYPO 25GX1X1/2 BEV (NEEDLE) IMPLANT
NEEDLE HYPO 25GX1X1/2 BEV (NEEDLE) IMPLANT
NS IRRIG 1000ML POUR BTL (IV SOLUTION) ×3 IMPLANT
PAD ARMBOARD POSITIONER FOAM (MISCELLANEOUS) ×6 IMPLANT
PATTIES SURGICAL .5 X.5 (GAUZE/BANDAGES/DRESSINGS) ×3 IMPLANT
PATTIES SURGICAL .5 X3 (DISPOSABLE) IMPLANT
POSITIONER HEAD DONUT 9IN (MISCELLANEOUS) IMPLANT
SOL ANTI FOG 6CC (MISCELLANEOUS) IMPLANT
TOWEL GREEN STERILE FF (TOWEL DISPOSABLE) ×6 IMPLANT
TUBE CONNECTING 12X1/4 (SUCTIONS) ×3 IMPLANT

## 2023-10-02 NOTE — Procedures (Signed)
 Extubation Procedure Note  Patient Details:   Name: Lori Gates DOB: July 04, 1955 MRN: [id number]   Airway Documentation:    Vent end date: 10/02/23 Vent end time: 1552   Evaluation  O2 sats: stable throughout Complications: No apparent complications Patient did tolerate procedure well. Bilateral Breath Sounds: Clear, Diminished   Yes  Pt extubated per MD order. No cuff leak, MD aware. No stridor post extubation. Vitals stable throughout, RT will continue to monitor.   Morrell Aran 10/02/2023, 3:56 PM

## 2023-10-02 NOTE — Anesthesia Procedure Notes (Signed)
 Procedure Name: Intubation Date/Time: 10/02/2023 10:29 AM  Performed by: Johann Muta, CRNAPre-anesthesia Checklist: Patient identified, Emergency Drugs available, Suction available and Patient being monitored Patient Re-evaluated:Patient Re-evaluated prior to induction Oxygen Delivery Method: Circle System Utilized Preoxygenation: Pre-oxygenation with 100% oxygen Induction Type: IV induction Ventilation: Mask ventilation without difficulty Laryngoscope Size: Glidescope and 3 Grade View: Grade I Tube type: Oral Laser Tube: Laser Tube Tube size: 5.0 mm Number of attempts: 1 Airway Equipment and Method: Stylet and Oral airway Placement Confirmation: ETT inserted through vocal cords under direct vision, positive ETCO2 and breath sounds checked- equal and bilateral Tube secured with: Tape Dental Injury: Teeth and Oropharynx as per pre-operative assessment

## 2023-10-02 NOTE — Op Note (Signed)
 Operative Report  PREOPERATIVE DIAGNOSIS: 1. Large left vocal fold polyp 2. Intermittent stridor 3. 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 Large left vocal fold polyp 2. Intermittent stridor 3. 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 Direct microlaryngoscopy with excision of the left true vocal cord polyp/vocal fold stripping with CO2 laser 2. Rigid bronchoscopy  SURGEON: Artice Last, MD  ASSISTANT: none  ANESTHESIA: General endotracheal with a laser safe tube  COMPLICATIONS: None.  ESTIMATED BLOOD LOSS: < 5 mL  INTRAOPERATIVE FINDINGS: 1.Easy exposure with female Sataloff laryngoscope   2. Large purple polypoid lesion along the entire length of the left true vocal fold (possible Reinke's polyp vs hemorrhagic polyp) 3. No additional lesions noted on rigid bronchoscopy in subglottis or proximal trachea 4. Mucus plug at the end of the endotracheal tube on extubation, suctioned with improved breath sounds and oxygen saturation    SPECIMEN: 1. Left vocal fold mass/polyp   INDICATIONS AND CONSENT: 22 yoF who was recently admitted after being found down at home with seizure activity requiring intubation for airway protection, and on extubation finding of intermittent stridor. Flexible laryngoscopy at bedside revealed possible right vocal fold paralysis and a large left vocal fold polyp with narrowing of the glottic aperture. The procedure, risks, benefits, alternatives, potential complications, possible outcomes as well as the option of no treatment were reviewed with the patient's family, who indicated their understanding and wished to proceed forward with the procedure. Informed consent was obtained. Consent was obtained from the family due to patient's altered mental status.   DESCRIPTION OF PROCEDURE: On 10/02/2023, the patient was brought to the operating room by the anesthesia team. The patient was transferred onto the operating table  and placed in supine position. After smooth induction of general endotracheal anesthesia, a surgeon initiated time-out was performed. The bed had been rotated 90 degrees. The patient was then draped in the appropriate fashion. We first began by placing wet gauze over the maxillary ridge as the patient was edentulous for protection during the procedure. The female Sataloff laryngoscope was then advanced into the oral cavity and oropharynx until the larynx was brought into view. Once the larynx was adequately visualized the patient was placed into suspension. A 0 degree Hopkins endoscope were then used to visualize the larynx and photo documentation was obtained. Thorough examination including palpation revealed a large purple polypoid lesion along the left true vocal fold with narrowing of the glottic aperture. Rigid bronchoscopy did not reveal extension of the lesion along the infraglottic portion of the true vocal folds, subglottis or carina. Given these findings, we decided to proceed with excision.   The lesion was lightly retracted medially with a sweetheart retractor. We then proceeded with excision and vocal fold stripping portion of the procedure using a combination of the CO2 laser and microlaryngeal instruments. The operating microscope was brought into the field. Saline soaked blue towels were wrapped around the face and laryngoscope for protection. All operating personnel were confirmed to be wearing laser safe glasses.  A laser safety time out was then conducted in the usual standard fashion. We first began by utilizing the CO2 laser with the operating microscope. Settings of 3-4 Watts, superpulsed in continuous mode was used to first excise true vocal fold polyp and then ablate the site for hemostasis using defocused laser beam for the latter. A specimen removed (outlined above) was submitted for permanent histopathology.   At the end of the excision and ablation for hemostasis, we used  Afrin  soaked pledgets to wipe off char and old blood. Photo documentation was then once again obtained using the endoscope. The patient was then removed from suspension and the Sataloff laryngoscope was carefully removed without injury to the mucosa or dentition. The mouth guard was removed and the dentition was once again inspected with no evidence of injury to the teeth.  This completed the procedure. The patient tolerated the procedure well without any immediate post operative complications. All counts were correct x2 at the conclusion of the procedure. The specimen was sent off for permanent pathology.

## 2023-10-02 NOTE — Progress Notes (Addendum)
 NAME:  NADIN PESCHKE, MRN:  [id number], DOB:  03-11-1956, LOS: 8 ADMISSION DATE:  09/24/2023, CONSULTATION DATE:  4/29 REFERRING MD:  Dr. Lincoln Renshaw, CHIEF COMPLAINT:  seizures   History of Present Illness:  68 year old female w/ poorly controlled  IDDM, CKD stage IV, HTN, LLD, HFpEF, substance abuse (cocaine) followed by nephrology in [place].  Hx from ED: Daughter found on floor around 1500 actively seizing, CBG read "high". Continued to seize from time of EMS dispatch to arrival to ER. Pt had received 2.5 mg versed  in route. On arrival had right facial twitching, right gaze pref. Received 2 mg IV ativan . After this gaze pref returned to midline w/ snoring respirations, no further twitching, would grimace to noxious stim.  CT head negative for acute bleed, there was left periorbital facial swelling.  MRI neg for acute process per neuro.  Intubated by EDP for airway protection  Given IV keppra  (total 2 gms)  Initial lab wk: Na 129, glucose 989, anion gap 16, wbc 13, CK 121, trop 114, VBG Ph 7.08, pco2 81, bicarb 24, lactate 2.8-->2.9 Post intubation abg: 7.42/29/130/19.2 PCXR ett good position. 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 Down time not clear CBG "high" seized from EMS notification to arrival to ED> CT and MRI brain neg for acute bleed. Loaded 2 gm keppra . Intubated airway protection  5/1: EEG negative for  ongoing seizures, off sedation, temp HD cath placed 5/2: HD 5/4: extubated > later in evening developed wheezing/stridor> racemic epi and nebs  5/6: ongoing stridor but no respiratory distress, ENT consult > flex fiberoptic laryngoscopy showing R VF paralysis, large hemorrhagic polyp on left VF. Signed out to TRH 5/7: to OR with ENT for left polyp excision > post - op to ICU left intubated due to low tidal volumes   Interim History / Subjective:  Patient intubated, on propofol  but wakes to voice and follows commands   Objective   Blood pressure (!) 153/58, pulse 83, temperature 98.6 F (37 C), temperature source Axillary, resp. rate 20, height 5\' 5"  (1.651 m), weight 66.6 kg, SpO2 100%.        Intake/Output Summary (Last 24 hours) at 10/02/2023 1220 Last data filed at 10/02/2023 1117 Gross per 24 hour  Intake --  Output 5 ml  Net -5 ml   Filed Weights   10/01/23 0457 10/02/23 0442 10/02/23 0753  Weight: 69.6 kg 66.6 kg 66.6 kg    Examination: General: middle aged female, laying in bed, vented, following commands  HEENT: ncat, anicteric sclera, mmm, endotracheally intubated Neuro: wakes to voice, follows commands, no focal deficit Chest: CTAB, no wheezing, rhonchi, rales, vent synchrony  Heart: s1s2, no murmur, rub, gallop Abdomen: flat and soft   Resolved Hospital Problem list   Hyponatremia, hypophosphatemia Hyperkalemia Sepsis due to UTI, POA Lactic acidosis, resolved  Assessment & Plan:  Acute hypoxic/hypercapnic respiratory failure Right lower lobe pneumonia likely aspiration Post extubation stridor,  R VF paralysis, L large hemorrhagic polyp s/p polypectomy by ENT  - placed on SBT on arrival to ICU - pulling 300 Vt on 8/5. Will watch  - prn racemic epi  - atrovent prn  - close airway monitoring post-extubation - would want to ensure good cuff leak prior to extubation  -  full mechanical vent support - lung protective ventilation 6-8cc/kg Vt - VAP and PAD bundle in  place  - titrate FiO2 to sat goal >92  - maintain peak/plats <30, driving pressures <19   - Completed 7-day course of antibiotic, currently on room air  Acute metabolic encephalopathy secondary to DKA Acute toxic encephalopathy in the setting of cocaine abuse - awake, following commands  - delirium precautions   Seizure with post-ictal state Likely in the setting of DKA with blood sugar close to 1000 - keppra  500mg  BID  - thiamine  100mg  daily  - seizure precautions   Poorly controlled diabetes, presented with DKA with coma DKA has resolved. A1c 14 - ssi  - semglee  5 U daily  - cbg q4h   Paroxysmal AF - heparin  SQ  - metoprolol 25mg  BID  - tele monitoring   HTN HLD - metoprolol scheduled and prn  - atorvastatin  80mg  daily   HFpEF -torsemide 20mg  BID   CC time: 32 Arlys [facility] 10/02/23 12:28 PM  Please see [email or web address] for pager details.  From 7A-7P if no response, please call [phone] After hours, please call ELink [phone]

## 2023-10-02 NOTE — Interval H&P Note (Signed)
 History and Physical Interval Note:  10/02/2023 10:02 AM  [name]  has presented today for surgery, with the diagnosis of vocal cord mass.  The various methods of treatment have been discussed with the patient and family. After consideration of risks, benefits and other options for treatment, the patient has consented to  Procedure(s): DIRECT MICROLARYNGOSCOPY, BRONCHOSCOPY, CO2 LASER EXCISION OF LEFT VOCAL FOLD POLYP/MASS (Left) BRONCHOSCOPY, RIGID (N/A) as a surgical intervention.  The patient's history has been reviewed, patient examined, no change in status, stable for surgery.  I have reviewed the patient's chart and labs.  Questions were answered to the patient's satisfaction.     Lori Gates

## 2023-10-02 NOTE — Transfer of Care (Signed)
 Immediate Anesthesia Transfer of Care Note  Patient: MONEE SIVERSON  Procedure(s) Performed: DIRECT MICROLARYNGOSCOPY, BRONCHOSCOPY, CO2 LASER EXCISION OF LEFT VOCAL FOLD POLYP/MASS (Left) BRONCHOSCOPY, RIGID  Patient Location: PACU and ICU  Anesthesia Type:General  Level of Consciousness: sedated  Airway & Oxygen Therapy: Patient Spontanous Breathing and Patient placed on Ventilator (see vital sign flow sheet for setting)  Post-op Assessment: Report given to RN and Post -op Vital signs reviewed and stable  Post vital signs: Reviewed and stable  Last Vitals:  Vitals Value Taken Time  BP    Temp    Pulse 76 10/02/23 1229  Resp 19 10/02/23 1229  SpO2 100 % 10/02/23 1229  Vitals shown include unfiled device data.  Last Pain: 140/73, 76hr, 97% sat, vent Vitals:   10/02/23 0753  TempSrc: Axillary  PainSc: 0-No pain         Complications: No notable events documented.

## 2023-10-02 NOTE — Progress Notes (Signed)
 SLP Cancellation Note  Patient Details Name: Lori Gates MRN: [id number] DOB: 1955/07/12   Cancelled treatment:        Pt with "no movement seen on the right side (suspect R VF paralysis), left VF with a large hemorrhagic polyp along left vocal fold" per ENT laryngoscopy 5/6.  Plan for VF surgery 5/7.  SLP will hold PO trials at present and reassess when medically ready following ENT intervention.   Elester Grim, MA, CCC-SLP Acute Rehabilitation Services Office: [phone] 10/02/2023, 7:55 AM

## 2023-10-02 NOTE — Progress Notes (Signed)
 PT Cancellation Note  Patient Details Name: Lori Gates MRN: [id number] DOB: July 13, 1955   Cancelled Treatment:    Reason Eval/Treat Not Completed: Patient at procedure or test/unavailable (Pt off the floor at procedure. Will follow up later if time allows.)   Miku Udall 10/02/2023, 8:30 AM

## 2023-10-02 NOTE — Progress Notes (Signed)
  NURSING PROGRESS NOTE  [name] [id number] Transfer Data: 10/02/2023 1:06 PM Attending Provider: Trevor Fudge, MD GEX:[id number], Provider, MD Code Status: FULL  Lori Gates is a 68 y.o. female patient transferred from  OR to 66M room 10, patient is Intubated.   Cardiac Monitoring: Box # 65M-10 in place. Cardiac monitor yields:normal sinus rhythm.  Last Documented Vital Signs: Blood pressure (!) 147/71, pulse 73, temperature 97.8 F (36.6 C), temperature source Axillary, resp. rate (!) 27, height 5\' 5"  (1.651 m), weight 66.6 kg, SpO2 100%.

## 2023-10-02 NOTE — Progress Notes (Signed)
 OT Cancellation Note  Patient Details Name: Lori Gates MRN: [id number] DOB: 15-Jan-1956   Cancelled Treatment:    Reason Eval/Treat Not Completed: Patient at procedure or test/ unavailable.   Karilyn Ouch, OTR/L [facility] Acute Rehabilitation Office: [phone]   Emery Hans 10/02/2023, 7:44 AM

## 2023-10-02 NOTE — Anesthesia Postprocedure Evaluation (Signed)
 Anesthesia Post Note  Patient: TARYNE MACARTNEY  Procedure(s) Performed: DIRECT MICROLARYNGOSCOPY, BRONCHOSCOPY, CO2 LASER EXCISION OF LEFT VOCAL FOLD POLYP/MASS (Left) BRONCHOSCOPY, RIGID     Patient location during evaluation: SICU Anesthesia Type: General Level of consciousness: sedated Pain management: pain level controlled Vital Signs Assessment: post-procedure vital signs reviewed and stable Respiratory status: patient on ventilator - see flowsheet for VS Cardiovascular status: stable Postop Assessment: no apparent nausea or vomiting Anesthetic complications: no Comments: Unexpected ICU admission. Patient unable to be safely extubated at end of case with low tidal volumes. Rapid shallow breathing. Felt safest plan would be to remain intubated and transfer to the ICU for further observation and more time to meet extubation criteria.    No notable events documented.  Last Vitals:  Vitals:   10/02/23 1245 10/02/23 1300  BP: (!) 142/72 (!) 147/71  Pulse: 79 73  Resp: 20 (!) 27  Temp: 36.6 C   SpO2: 100% 100%    Last Pain:  Vitals:   10/02/23 1245  TempSrc: Axillary  PainSc:                  Lethaniel Rave

## 2023-10-03 ENCOUNTER — Inpatient Hospital Stay (HOSPITAL_COMMUNITY): Payer: Medicare (Managed Care)

## 2023-10-03 ENCOUNTER — Encounter (HOSPITAL_COMMUNITY): Payer: Self-pay | Admitting: Otolaryngology

## 2023-10-03 DIAGNOSIS — J9602 Acute respiratory failure with hypercapnia: Secondary | ICD-10-CM | POA: Diagnosis not present

## 2023-10-03 DIAGNOSIS — J189 Pneumonia, unspecified organism: Secondary | ICD-10-CM | POA: Diagnosis not present

## 2023-10-03 DIAGNOSIS — R569 Unspecified convulsions: Secondary | ICD-10-CM | POA: Diagnosis not present

## 2023-10-03 DIAGNOSIS — J9601 Acute respiratory failure with hypoxia: Secondary | ICD-10-CM

## 2023-10-03 LAB — CBC
HCT: 29.5 % — ABNORMAL LOW (ref 36.0–46.0)
Hemoglobin: 9.3 g/dL — ABNORMAL LOW (ref 12.0–15.0)
MCH: 30.6 pg (ref 26.0–34.0)
MCHC: 31.5 g/dL (ref 30.0–36.0)
MCV: 97 fL (ref 80.0–100.0)
Platelets: 331 10*3/uL (ref 150–400)
RBC: 3.04 MIL/uL — ABNORMAL LOW (ref 3.87–5.11)
RDW: 12.6 % (ref 11.5–15.5)
WBC: 12.9 10*3/uL — ABNORMAL HIGH (ref 4.0–10.5)
nRBC: 0 % (ref 0.0–0.2)

## 2023-10-03 LAB — GLUCOSE, CAPILLARY
Glucose-Capillary: 160 mg/dL — ABNORMAL HIGH (ref 70–99)
Glucose-Capillary: 118 mg/dL — ABNORMAL HIGH (ref 70–99)
Glucose-Capillary: 94 mg/dL (ref 70–99)
Glucose-Capillary: 159 mg/dL — ABNORMAL HIGH (ref 70–99)
Glucose-Capillary: 147 mg/dL — ABNORMAL HIGH (ref 70–99)

## 2023-10-03 LAB — TRIGLYCERIDES: Triglycerides: 102 mg/dL (ref <150)

## 2023-10-03 LAB — COMPREHENSIVE METABOLIC PANEL WITH GFR
ALT: 17 U/L (ref 0–44)
AST: 21 U/L (ref 15–41)
Albumin: 2.3 g/dL — ABNORMAL LOW (ref 3.5–5.0)
Alkaline Phosphatase: 147 U/L — ABNORMAL HIGH (ref 38–126)
Anion gap: 12 (ref 5–15)
BUN: 33 mg/dL — ABNORMAL HIGH (ref 8–23)
CO2: 25 mmol/L (ref 22–32)
Calcium: 8.6 mg/dL — ABNORMAL LOW (ref 8.9–10.3)
Chloride: 106 mmol/L (ref 98–111)
Creatinine, Ser: 2.36 mg/dL — ABNORMAL HIGH (ref 0.44–1.00)
GFR, Estimated: 22 mL/min — ABNORMAL LOW (ref >60)
Glucose, Bld: 162 mg/dL — ABNORMAL HIGH (ref 70–99)
Potassium: 3.9 mmol/L (ref 3.5–5.1)
Sodium: 143 mmol/L (ref 135–145)
Total Bilirubin: 0.3 mg/dL (ref 0.0–1.2)
Total Protein: 6.1 g/dL — ABNORMAL LOW (ref 6.5–8.1)

## 2023-10-03 LAB — RENAL FUNCTION PANEL
Albumin: 2.3 g/dL — ABNORMAL LOW (ref 3.5–5.0)
Anion gap: 13 (ref 5–15)
BUN: 34 mg/dL — ABNORMAL HIGH (ref 8–23)
CO2: 24 mmol/L (ref 22–32)
Calcium: 8.6 mg/dL — ABNORMAL LOW (ref 8.9–10.3)
Chloride: 104 mmol/L (ref 98–111)
Creatinine, Ser: 2.32 mg/dL — ABNORMAL HIGH (ref 0.44–1.00)
GFR, Estimated: 22 mL/min — ABNORMAL LOW (ref >60)
Glucose, Bld: 159 mg/dL — ABNORMAL HIGH (ref 70–99)
Phosphorus: 3.1 mg/dL (ref 2.5–4.6)
Potassium: 3.9 mmol/L (ref 3.5–5.1)
Sodium: 141 mmol/L (ref 135–145)

## 2023-10-03 MED ORDER — INSULIN ASPART 100 UNIT/ML IJ SOLN
0.0000 [IU] | Freq: Three times a day (TID) | INTRAMUSCULAR | Status: DC
  Administered 2023-10-03: 3 [IU] via SUBCUTANEOUS
  Administered 2023-10-04: 8 [IU] via SUBCUTANEOUS

## 2023-10-03 MED ORDER — ORAL CARE MOUTH RINSE: 15.0000 mL | OROMUCOSAL | Status: DC | PRN

## 2023-10-03 MED ORDER — ORAL CARE MOUTH RINSE
15.0000 mL | OROMUCOSAL | Status: DC
  Administered 2023-10-03 – 2023-10-19 (×56): 15 mL via OROMUCOSAL

## 2023-10-03 NOTE — TOC Progression Note (Signed)
 Transition of Care [facility]) - Progression Note    Patient Details  Name: Lori Gates MRN: [id number] Date of Birth: [birth year]  [facility] CM/SW Contact  Tom-Riedesel, Yechezkel Fertig Daphne, RN Phone Number: 10/03/2023, 3:10 PM  Clinical Narrative:     Patient extubated yesterday 10/02/23, currently on room air.  CIR following for possible admit.   CM will follow as patient progresses with care towards discharge.      Expected Discharge Plan: IP Rehab Facility Barriers to Discharge: Continued Medical Work up  Expected Discharge Plan and Services                                               Social Determinants of Health (SDOH) Interventions SDOH Screenings   Food Insecurity: Patient Unable To Answer (09/25/2023)  Housing: Unknown (09/25/2023)  Transportation Needs: Patient Unable To Answer (09/25/2023)  Utilities: Patient Unable To Answer (09/25/2023)  Social Connections: Patient Unable To Answer (09/25/2023)  Tobacco Use: Medium Risk (09/24/2023)    Readmission Risk Interventions     No data to display

## 2023-10-03 NOTE — Progress Notes (Signed)
 NAME:  Lori Gates, MRN:  [id number], DOB:  July 22, 1955, LOS: 9 ADMISSION DATE:  09/24/2023, CONSULTATION DATE:  4/29 REFERRING MD:  Dr. Lincoln Renshaw, CHIEF COMPLAINT:  seizures   History of Present Illness:  68 year old female w/ poorly controlled  IDDM, CKD stage IV, HTN, LLD, HFpEF, substance abuse (cocaine) followed by nephrology in [place].  Hx from ED: Daughter found on floor around 1500 actively seizing, CBG read "high". Continued to seize from time of EMS dispatch to arrival to ER. Pt had received 2.5 mg versed  in route. On arrival had right facial twitching, right gaze pref. Received 2 mg IV ativan . After this gaze pref returned to midline w/ snoring respirations, no further twitching, would grimace to noxious stim.  CT head negative for acute bleed, there was left periorbital facial swelling.  MRI neg for acute process per neuro.  Intubated by EDP for airway protection  Given IV keppra  (total 2 gms)  Initial lab wk: Na 129, glucose 989, anion gap 16, wbc 13, CK 121, trop 114, VBG Ph 7.08, pco2 81, bicarb 24, lactate 2.8-->2.9 Post intubation abg: 7.42/29/130/19.2 PCXR ett good position. 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 Down time not clear CBG "high" seized from EMS notification to arrival to ED> CT and MRI brain neg for acute bleed. Loaded 2 gm keppra . Intubated airway protection  5/1: EEG negative for  ongoing seizures, off sedation, temp HD cath placed 5/2: HD 5/4: extubated > later in evening developed wheezing/stridor> racemic epi and nebs  5/6: ongoing stridor but no respiratory distress, ENT consult > flex fiberoptic laryngoscopy showing R VF paralysis, large hemorrhagic polyp on left VF. Signed out to TRH 5/7: to OR with ENT for left polyp excision > post - op to ICU left intubated due to low tidal volumes  5/8: extubated 5/7 afternoon. On RA and she has no symptoms. V/S are stable    Interim History / Subjective:     Objective   Blood pressure (!) 151/76, pulse 77, temperature 97.6 F (36.4 C), temperature source Oral, resp. rate 14, height 5\' 5"  (1.651 m), weight 66.5 kg, SpO2 96%.    Vent Mode: PSV;CPAP FiO2 (%):  [40 %] 40 % PEEP:  [5 cmH20] 5 cmH20 Pressure Support:  [5 cmH20-8 cmH20] 5 cmH20   Intake/Output Summary (Last 24 hours) at 10/03/2023 1315 Last data filed at 10/03/2023 0000 Gross per 24 hour  Intake 19.58 ml  Output 500 ml  Net -480.42 ml   Filed Weights   10/02/23 0442 10/02/23 0753 10/03/23 0500  Weight: 66.6 kg 66.6 kg 66.5 kg    Examination: General: alert, orientedx4, and comfortable. On RA. SpO2 97%  HENT: normal pharynx and oral mucosa. No LNE or thyromegaly. No JVD Noisy upper air breathing but no stridor  Lungs: symmetrical air entry bilaterally. Mild basal crackles. No wheezing Cardiovascular: NL S1/S2. No m/g/r Abdomen: no distension or tenderness Extremities: trace edema. Symmetrical  Neuro: nonfocal  Left >right UL edema: venous doppler noted    Resolved Hospital Problem list   Hyponatremia, hypophosphatemia Hyperkalemia Sepsis due to UTI, POA Lactic acidosis, resolved  Assessment & Plan:  Acute hypoxic/hypercapnic respiratory failure Right lower lobe pneumonia likely aspiration Post extubation stridor, R VF paralysis, L large hemorrhagic polyp s/p polypectomy by ENT (10/02/2023) - prn racemic epi  - Atrovent prn/Xopenex PRN -  Completed 7-day course of antibiotic, currently on room air  Acute metabolic encephalopathy secondary to DKA Acute toxic encephalopathy in the setting of cocaine abuse - delirium precautions   Seizure with post-ictal state Likely in the setting of DKA with blood sugar close to 1000 - keppra  500mg  BID  - thiamine  100mg  daily  - seizure precautions   Poorly controlled diabetes, presented with DKA with coma DKA has resolved. A1c 14 - ssi  - semglee  5 U daily  - cbg AC  Paroxysmal AF - heparin  SQ  - metoprolol 25mg  BID  - tele monitoring   HTN HLD - metoprolol scheduled and prn  - atorvastatin  80mg  daily   HFpEF (Echo EF 65%) -torsemide 20mg  BID   AKI/CKD-IV: on HD  Arlyne Bering, MD [facility] 10/03/23 1:15 PM  Please see [email or web address] for pager details.  From 7A-7P if no response, please call [phone] After hours, please call ELink [phone]

## 2023-10-03 NOTE — Progress Notes (Signed)
 Inpatient Rehab Admissions Coordinator:   At this time we are recommending a CIR consult and I will place an order per our protocol.   Loye Rumble, PT, DPT Admissions Coordinator [phone] 10/03/23  4:01 PM

## 2023-10-03 NOTE — Progress Notes (Signed)
 Speech Language Pathology Treatment: Dysphagia  Patient Details Name: Lori Gates MRN: [id number] DOB: Jan 01, 1956 Today's Date: 10/03/2023 Time: 6962-9528 SLP Time Calculation (min) (ACUTE ONLY): 8 min  Assessment / Plan / Recommendation Clinical Impression  Pt seen for ongoing dysphagia management.  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  Pt with improved breath sounds resting quietly in bed and following directions today. She pulled cortrak overnight.  She is able to attain some phonation.  SLP provided oral care prior to administration of PO trials, to which pt was agreeable.  She swallowed small amounts of water by spoon and straw.  There was grimacing, but she denies odynophagia.  No clinical s/s of aspiration were observed; however, given R VF paralysis, recent VF surgery, multiple intubations, and AMS, recommend objective swallow study.  Will plan for MBSS later this date.  Recommend pt remain NPO pending results of MBS.   HPI HPI: Lori Gates is a 68 year old female who presented to ED after daughter found on floor actively seizing, CBG read "high". Continued to seize from time of EMS dispatch to arrival to ER, and was intubated for airway protection.  ETT 4/29-5/4.  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 Pt with poorly controlled IDDM, CKD stage IV, HTN, LLD, HFpEF, substance abuse (cocaine) followed by nephrology in [place].      SLP Plan  MBS      Recommendations  for follow up therapy are one component of a multi-disciplinary discharge planning process, led by the attending physician.  Recommendations may be updated based on patient status, additional functional criteria and insurance authorization.    Recommendations  Diet recommendations: NPO Medication Administration: Via alternative means                  Oral care QID     Dysphagia, unspecified (R13.10)     MBS     Lori Grim, MA, CCC-SLP Acute Rehabilitation Services Office: [phone] 10/03/2023, 8:18 AM

## 2023-10-03 NOTE — Progress Notes (Signed)
 Nutrition Follow-up  DOCUMENTATION CODES:  Severe malnutrition in context of chronic illness  INTERVENTION:  Continue diet recommendation per SLP; currently on dysphagia 3, nectar thick liquids Mighty Shake TID with meals, each supplement provides 330 kcals and 9 grams of protein Magic cup TID with meals, each supplement provides 290 kcal and 9 grams of protein  NUTRITION DIAGNOSIS:  Severe Malnutrition related to chronic illness (uncontrolled DM, substance abuse) as evidenced by severe fat depletion, severe muscle depletion. - remains applicable  GOAL:   Patient will meet greater than or equal to 90% of their needs - goal unmet, addressing via diet advancement and   MONITOR:   Vent status, TF tolerance  REASON FOR ASSESSMENT:   Consult Enteral/tube feeding initiation and management  ASSESSMENT:   Pt with PMH of CKD stage IV, uncontrolled IDDM, HTN, HLD, smoking, cocaine abuse (reports stopped March 2024), CHF admitted from home for seizures. UDS positive for cocaine and benzos.  4/29 - admit from home with seizures, intubated  5/1 - nutrition initiated, HD initiated 5/4 - extubated, BSE NPO 5/5 - cortrak placed, BSE NPO, transferred out of ICU 5/7 -  s/p excision of L vocal cord polyp/vocal fold stripping; pt self removed Cortrak  5/8 - MBS - diet advanced to dysphagia 3, nectar thick liquids  Nephrology continues to follow. Continues on torsemide to help augment urine output/manage volume status while holding HD.   Per SLP/MBS report, pt with mild oropharyngeal dysphagia c/b delayed swallow initiation, decreased base of tongue retraction, reduced laryngeal elevation, incomplete laryngeal closure and diminished sensation. Diet advanced.  Spoke with pt at bedside. She is in good spirits and hungry asking for wafers. Ensured meal was ordered for her. Unfortunately unable to help locate her dentures at time of visit. She is amenable to receive nutrition supplements to augment  PO intake.   Admit weight: 60 kg Current weight: 66.5 kg  Medications: colace BID, SSI 0-15 units TID, semglee  5 units daily, protonix, miralax , thiamine  100mg  daily, torsemide 20mg  BID   Labs:  BUN 34, 33 Cr 2.32, 2.36 Alkaline phosphatase 147 GFR 22 CBG's 94-193 x24 hours  Diet Order:   Diet Order             DIET DYS 3 Fluid consistency: Nectar Thick  Diet effective now                   EDUCATION NEEDS:   Not appropriate for education at this time  Skin:  Skin Assessment: Skin Integrity Issues: Skin Integrity Issues:: Stage II Stage II: medial sacrum  Last BM:  5/6  Height:   Ht Readings from Last 1 Encounters:  10/02/23 5\' 5"  (1.651 m)    Weight:   Wt Readings from Last 1 Encounters:  10/03/23 66.5 kg    Ideal Body Weight:  56.8 kg  BMI:  Body mass index is 24.4 kg/m.  Estimated Nutritional Needs:   Kcal:  1600-1800  Protein:  90-100 grams  Fluid:  1.2 L/day  Rocklin Chute, RDN, LDN Clinical Nutrition See AMiON for contact information.

## 2023-10-03 NOTE — Progress Notes (Signed)
 Pt pulled out cortrak after 10pm med pass.  No meds due till AM.  Mitts placed, pt educated. Will relay to day team.

## 2023-10-03 NOTE — Progress Notes (Signed)
 Patient ID: Lori Gates, female   DOB: 1955-06-27, 68 y.o.   MRN: [id number] [facility] Progress Note   Assessment/ Plan:   1. Acute kidney Injury on chronic kidney disease stage IV: Baseline creatinine ranging 2.5-2.8.  She has surprisingly stable labs (both BUN and creatinine) without any acute electrolyte abnormality or uremic symptoms prompting the need for dialysis.  Continue torsemide 20 mg twice daily at this time to help augment urine output/manage volume status. 2.  Diabetic ketoacidosis: Initially with coma/altered mental status that has improved with insulin  therapy/fluids.  Glycemia management per primary service. 3.  Acute metabolic encephalopathy: Appears to be bifactorial from diabetic ketoacidosis and cocaine abuse.  Mental status appears to have improved status post management of DKA.  She is on levetiracetam  with recent seizures. 4.  Hypertension: Elevated blood pressure noted, monitor on amlodipine  and with initiation of diuretics.  Subjective:   Admitted to ICU overnight with acute respiratory failure after undergoing direct microlaryngoscopy with excision of left true vocal cord polyp as well as vocal cord fold stripping with carbon dioxide laser.  She was successfully extubated yesterday afternoon and has been stable overnight.   Objective:   BP (!) 160/101   Pulse 74   Temp (!) 97.5 F (36.4 C) (Oral)   Resp 16   Ht 5\' 5"  (1.651 m)   Wt 66.5 kg   SpO2 96%   BMI 24.40 kg/m   Intake/Output Summary (Last 24 hours) at 10/03/2023 0831 Last data filed at 10/03/2023 0000 Gross per 24 hour  Intake 271.3 ml  Output 505 ml  Net -233.7 ml   Weight change: 0 kg  Physical Exam: Gen: Awake, alert and resting comfortably in bed.  With protective mittens in place CVS: Pulse regular rhythm, normal rate, S1 and S2 normal Resp: Clear to auscultation bilaterally, no rales/rhonchi Abd: Soft, obese, nontender, bowel sounds normal Ext: 2+ lower extremity edema,  2+ left upper extremity edema, trace-1+ right upper extremity edema.  Left brachiobasilic fistula palpable  Imaging: DG Chest Port 1 View Result Date: 10/02/2023 CLINICAL DATA:  Acute respiratory failure with hypoxia. EXAM: PORTABLE CHEST 1 VIEW COMPARISON:  Oct 01, 2023. FINDINGS: Stable cardiomediastinal silhouette. Left retrocardiac opacity is noted concerning for pneumonia or atelectasis with small associated effusion. Minimal right basilar subsegmental atelectasis is noted. Endotracheal and feeding tubes are in good position. Right internal jugular catheter is unchanged. IMPRESSION: Left retrocardiac opacity is noted concerning for pneumonia or atelectasis with small pleural effusion. Minimal right basilar subsegmental atelectasis. Electronically Signed   By: Rosalene Colon M.D.   On: 10/02/2023 14:33   DG CHEST PORT 1 VIEW Result Date: 10/01/2023 CLINICAL DATA:  Shortness of breath EXAM: PORTABLE CHEST 1 VIEW COMPARISON:  Sep 29, 2023 FINDINGS: No change in the position of the right IJ dialysis catheter A feeding tube is in the stomach. There are bilateral basilar infiltrates, atelectasis and small bilateral pleural effusions without significant upper lobe consolidations The heart and mediastinum within normal limits IMPRESSION: Bilateral basilar infiltrates, atelectasis and small bilateral pleural effusions. Electronically Signed   By: Fredrich Jefferson M.D.   On: 10/01/2023 15:13    Labs: BMET Recent Labs  Lab 09/27/23 0600 09/28/23 0721 09/29/23 0505 09/30/23 0631 10/01/23 0549 10/01/23 1129 10/02/23 0525 10/02/23 1402 10/03/23 0500  NA 140 138 139 140 140 141 141 142 141  143  K 4.1 3.0* 4.1 3.9 4.0 3.5 3.9 4.0 3.9  3.9  CL 106 107 99 106 107 103  104  --  104  106  CO2 24 22 23 23 24 24 27   --  24  25  GLUCOSE 173* 44* 318* 107* 268* 240* 225*  --  159*  162*  BUN 29* 23 35* 34* 37* 35* 37*  --  34*  33*  CREATININE 2.89* 1.92* 2.34* 2.23* 2.20* 2.27* 2.33*  --  2.32*  2.36*   CALCIUM  7.9* 7.9* 8.1* 8.0* 8.2* 8.2* 8.5*  --  8.6*  8.6*  PHOS 2.3* 2.2* 4.2 4.3  --   --  2.4*  --  3.1   CBC Recent Labs  Lab 09/28/23 0721 09/30/23 0911 10/01/23 1129 10/02/23 1402 10/03/23 0500  WBC 5.9 4.8 6.0  --  12.9*  NEUTROABS  --  3.5 2.9  --   --   HGB 9.7* 8.5* 10.4* 8.8* 9.3*  HCT 28.9* 26.7* 32.0* 26.0* 29.5*  MCV 92.6 95.7 94.7  --  97.0  PLT 161 188 267  --  331    Medications:     atorvastatin   80 mg Per Tube Daily   Chlorhexidine  Gluconate Cloth  6 each Topical Daily   docusate  100 mg Per Tube BID   feeding supplement (PROSource TF20)  60 mL Per Tube Daily   fiber supplement (BANATROL TF)  60 mL Per Tube BID   heparin   5,000 Units Subcutaneous Q8H   insulin  aspart  0-15 Units Subcutaneous Q4H   insulin  glargine-yfgn  5 Units Subcutaneous Daily   levETIRAcetam   500 mg Per Tube BID   metoprolol tartrate  25 mg Per Tube BID   mouth rinse  15 mL Mouth Rinse 4 times per day   pantoprazole (PROTONIX) IV  40 mg Intravenous Daily   polyethylene glycol  17 g Per Tube Daily   sodium chloride  flush  10-40 mL Intracatheter Q12H   thiamine   100 mg Per Tube Daily   torsemide  20 mg Per Tube BID    Clevester Dally, MD 10/03/2023, 8:31 AM

## 2023-10-03 NOTE — Progress Notes (Signed)
 Pt transferred to 2W room 28 without any complications all belongings place at bedside.

## 2023-10-03 NOTE — Progress Notes (Signed)
 Physical Therapy Treatment Patient Details Name: Lori Gates MRN: [id number] DOB: 1956/02/22 Today's Date: 10/03/2023   History of Present Illness The pt is a 68 yo female presenting 4/29 after being found down, unresponsive, seizing, and with hyperglycemia (>700). Intubated 4/29-5/4. UDS + for cocaine and benzos. Pt admitted for management of DKA, seizures, and UTI/sepsis. HD initiated 5/1. 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 PMH includes: asthma, DM II, HTN.    PT Comments  Pt admitted with above diagnosis. Pt was able to sit EOB and transfer to 3N1 and then to recliner with mod assist of 2 initially progressing to mod assist of 1 for last transfer 3N1 to recliner.  No knee buckling noted. Pt confused and only oriented to self but was able to participate with therapists. Will continue to follow acutely. Needs incr cues and assist for safety.  Pt currently with functional limitations due to the deficits listed below (see PT Problem List). Pt will benefit from acute skilled PT to increase their independence and safety with mobility to allow discharge.       If plan is discharge home, recommend the following: Two people to help with walking and/or transfers;Two people to help with bathing/dressing/bathroom;Assistance with cooking/housework;Assistance with feeding;Direct supervision/assist for medications management;Direct supervision/assist for financial management;Assist for transportation;Help with stairs or ramp for entrance;Supervision due to cognitive status   Can travel by private vehicle        Equipment Recommendations  Wheelchair (measurements PT);Wheelchair cushion (measurements PT)    Recommendations for Other Services Rehab consult     Precautions / Restrictions Precautions Precautions: Fall Recall of Precautions/Restrictions: Impaired Precaution/Restrictions Comments: bilateral  mittens Restrictions Weight Bearing Restrictions Per Provider Order: No     Mobility  Bed Mobility Overal bed mobility: Needs Assistance Bed Mobility: Rolling, Supine to Sit, Sit to Supine Rolling: Mod assist   Supine to sit: Mod assist, +2 for physical assistance, +2 for safety/equipment Sit to supine: Mod assist, +2 for safety/equipment, +2 for physical assistance   General bed mobility comments: modA to manage LE movement and elevation of trunk.    Transfers Overall transfer level: Needs assistance Equipment used: 2 person hand held assist Transfers: Sit to/from Stand, Bed to chair/wheelchair/BSC Sit to Stand: +2 physical assistance, +2 safety/equipment, From elevated surface, Mod assist Stand pivot transfers: Mod assist, +2 physical assistance, +2 safety/equipment         General transfer comment: Needs incr cues to initiate movement with mod assist to power up.  Once up, then min assist to maintain standing. Provided blocking bil knees prn without significant buckling noted and assist pt at buttocks to stand tall and step around from bed to 3n1. Once pt urinated, assist 3N1 to recliner with same assist.    Ambulation/Gait               General Gait Details: TBA   Stairs             Wheelchair Mobility     Tilt Bed    Modified Rankin (Stroke Patients Only)       Balance Overall balance assessment: Needs assistance Sitting-balance support: Bilateral upper extremity supported, Feet supported Sitting balance-Leahy Scale: Poor Sitting balance - Comments: initially minA to maintain sitting, progressed to moments of CGA   Standing balance support: Bilateral upper extremity supported, During functional activity Standing balance-Leahy Scale: Poor Standing balance comment: dependent on mod A of 2 to maintain static  standing progressing to min assist of 1 static standing.                            Communication Communication Communication:  Impaired Factors Affecting Communication: Reduced clarity of speech;Difficulty expressing self  Cognition Arousal: Lethargic, Alert Behavior During Therapy: Lability, Flat affect, Anxious   PT - Cognitive impairments: Difficult to assess, Awareness, Memory, Initiation, Sequencing, Problem solving, Safety/Judgement Difficult to assess due to: Level of arousal                     PT - Cognition Comments: inconsistent command following or attempt to answer questions. Oriented to self only. Following commands: Impaired Following commands impaired: Follows one step commands inconsistently, Follows one step commands with increased time    Cueing Cueing Techniques: Verbal cues, Gestural cues, Tactile cues  Exercises General Exercises - Lower Extremity Ankle Circles/Pumps: AROM, Both, 5 reps, Supine Long Arc Quad: AROM, Both, 5 reps, Seated    General Comments General comments (skin integrity, edema, etc.): Bil hand swelling noted however pt can move them.  Grip is weak due to swelling.  73-81 bpm, 96% RA, 150/67      Pertinent Vitals/Pain Pain Assessment Pain Assessment: No/denies pain Breathing: normal Negative Vocalization: none Facial Expression: smiling or inexpressive Body Language: relaxed Consolability: no need to console PAINAD Score: 0    Home Living                          Prior Function            PT Goals (current goals can now be found in the care plan section) Acute Rehab PT Goals Patient Stated Goal: to go home Progress towards PT goals: Progressing toward goals    Frequency    Min 2X/week      PT Plan      Co-evaluation PT/OT/SLP Co-Evaluation/Treatment: Yes Reason for Co-Treatment: Complexity of the patient's impairments (multi-system involvement);Necessary to address cognition/behavior during functional activity;For patient/therapist safety;To address functional/ADL transfers PT goals addressed during session: Mobility/safety  with mobility;Balance;Strengthening/ROM OT goals addressed during session: Strengthening/ROM      AM-PAC PT "6 Clicks" Mobility   Outcome Measure  Help needed turning from your back to your side while in a flat bed without using bedrails?: A Lot Help needed moving from lying on your back to sitting on the side of a flat bed without using bedrails?: A Lot Help needed moving to and from a bed to a chair (including a wheelchair)?: Total Help needed standing up from a chair using your arms (e.g., wheelchair or bedside chair)?: Total Help needed to walk in hospital room?: Total Help needed climbing 3-5 steps with a railing? : Total 6 Click Score: 8    End of Session Equipment Utilized During Treatment: Gait belt Activity Tolerance: Patient limited by fatigue Patient left: with call bell/phone within reach;in chair;with chair alarm set;with restraints reapplied Nurse Communication: Mobility status PT Visit Diagnosis: Muscle weakness (generalized) (M62.81);Unsteadiness on feet (R26.81);Other abnormalities of gait and mobility (R26.89)     Time: 1610-9604 PT Time Calculation (min) (ACUTE ONLY): 39 min  Charges:    $Therapeutic Exercise: 8-22 mins $Therapeutic Activity: 8-22 mins PT General Charges $$ ACUTE PT VISIT: 1 Visit                     Niala Stcharles M,PT Acute Rehab Services [phone]  Florencia Hunter 10/03/2023, 12:27 PM

## 2023-10-03 NOTE — Progress Notes (Signed)
 Occupational Therapy Treatment Patient Details Name: Lori Gates MRN: [id number] DOB: 1955-06-27 Today's Date: 10/03/2023   History of present illness The pt is a 68 yo female presenting 4/29 after being found down, unresponsive, seizing, and with hyperglycemia (>700). Intubated 4/29-5/4. UDS + for cocaine and benzos. Pt admitted for management of DKA, seizures, and UTI/sepsis. HD initiated 5/1. 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  PMH includes: asthma, DM II, HTN.   OT comments  Pt seen as cotreat to progress mobility. Able to progress to [facility] then to recliner with mod A + 2 with HHA. Pt with increased level of arousal as session progressed with the ability to assist with UB ADL while seated on the [facility]. Pt with significant cognitive deficits in addition to generalized weakness and would benefit from intensive inpatient follow-up therapy, >3 hours/day to maximize functional level of independence to DC home with family. VSS throughout session. Acute OT to follow.       If plan is discharge home, recommend the following:  Two people to help with walking and/or transfers;Two people to help with bathing/dressing/bathroom;Assistance with feeding;Assistance with cooking/housework;Direct supervision/assist for medications management;Direct supervision/assist for financial management;Assist for transportation;Help with stairs or ramp for entrance;Supervision due to cognitive status   Equipment Recommendations  BSC/3in1    Recommendations for Other Services      Precautions / Restrictions Precautions Precautions: Fall Recall of Precautions/Restrictions: Impaired Precaution/Restrictions Comments: bilateral mittens Restrictions Weight Bearing Restrictions Per Provider Order: No       Mobility Bed Mobility Overal bed mobility: Needs  Assistance Bed Mobility: Rolling, Supine to Sit, Sit to Supine Rolling: Mod assist   Supine to sit: Mod assist, +2 for physical assistance, +2 for safety/equipment Sit to supine: Mod assist, +2 for safety/equipment, +2 for physical assistance   General bed mobility comments: modA to manage LE movement and elevation of trunk.    Transfers Overall transfer level: Needs assistance Equipment used: 2 person hand held assist Transfers: Sit to/from Stand, Bed to chair/wheelchair/BSC Sit to Stand: +2 physical assistance, +2 safety/equipment, From elevated surface, Mod assist Stand pivot transfers: Mod assist, +2 physical assistance, +2 safety/equipment         General transfer comment: Needs incr cues to initiate movement with mod assist to power up.  Once up, then min assist to maintain standing. Provided blocking bil knees prn without significant buckling noted and assist pt at buttocks to stand tall and step around from bed to 3n1. Once pt urinated, assist 3N1 to recliner with same assist.     Balance Overall balance assessment: Needs assistance Sitting-balance support: Bilateral upper extremity supported, Feet supported Sitting balance-Leahy Scale: Poor Sitting balance - Comments: initially minA to maintain sitting, progressed to moments of CGA   Standing balance support: Bilateral upper extremity supported, During functional activity Standing balance-Leahy Scale: Poor Standing balance comment: dependent on mod A of 2 to maintain static standing progressing to min assist of 1 static standing.                           ADL either performed or assessed with clinical judgement   ADL Overall ADL's : Needs assistance/impaired Eating/Feeding: Moderate assistance Eating/Feeding Details (indicate cue type and reason): nectar thick Grooming: Moderate assistance; able to remove deoderant top independently   Upper Body Bathing: Moderate assistance - able  to wash underneatharms,  chest; easily distracted and requires tactile cues to redirect to task; "I like being clean"   Lower Body Bathing: Maximal assistance;Sit to/from stand   Upper Body Dressing : Maximal assistance;Sitting   Lower Body Dressing: Total assistance   Toilet Transfer: Maximal assistance;Stand-pivot   Toileting- Clothing Manipulation and Hygiene: Total assistance       Functional mobility during ADLs: Maximal assistance       Extremity/Trunk Assessment Upper Extremity Assessment Upper Extremity Assessment: RUE deficits/detail;LUE deficits/detail RUE Deficits / Details: using functionally however generally weak; gross graps release to hold cloth; able to touch hand to forehead poor in-hand manipulation skills LUE Deficits / Details: edematous; weaker thanRUE; gross grasp/release; absent in-hand manipulation skills' able to maintain grasp on cloth; only abl eot complete hand to mouth   Lower Extremity Assessment Lower Extremity Assessment: Defer to PT evaluation        Vision   Vision Assessment?: Vision impaired- to be further tested in functional context Additional Comments: wears glasses; was wearing glasses on entry however she stated those were not her glasses Conjugate gaze;poor visual attention  Perception     Praxis     Communication Communication Communication: Impaired Factors Affecting Communication: Reduced clarity of speech;Difficulty expressing self   Cognition Arousal: Lethargic, Alert Behavior During Therapy: Lability, Flat affect, Anxious Cognition: Cognition impaired - convinced she was not in her room and not in the hospital. 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  Grip is weak due to swelling.  73-81 bpm, 96% RA, 150/67    Pertinent Vitals/ Pain       Pain Assessment Pain Assessment: No/denies pain  Home Living                                          Prior Functioning/Environment              Frequency  Min 2X/week        Progress Toward Goals  OT Goals(current goals can now be found in the care plan section)  Progress towards OT goals: Progressing toward goals  Acute Rehab OT Goals Patient Stated Goal: to get her clothes OT Goal Formulation: With patient Time For Goal Achievement: 10/14/23 Potential to Achieve Goals: Good ADL Goals Pt Will Perform Grooming: sitting;with set-up;with supervision Pt Will Perform Upper Body Bathing: with set-up;with supervision Pt Will Perform Lower Body Bathing: with min assist;sit to/from stand Pt Will Perform Lower Body Dressing: with mod assist;sit to/from stand Pt Will Transfer to Toilet: with min assist;stand pivot transfer Additional ADL Goal #1: pt will  sequence basic mobility and ADL with min cues  Plan      Co-evaluation      Reason for Co-Treatment: Complexity of the patient's impairments (multi-system involvement);Necessary to address cognition/behavior during functional activity;For patient/therapist safety;To address functional/ADL transfers PT goals addressed during session:  Mobility/safety with mobility;Balance;Strengthening/ROM OT goals addressed during session: Strengthening/ROM      AM-PAC OT "6 Clicks" Daily Activity     Outcome Measure   Help from another person eating meals?: A Lot Help from another person taking care of personal grooming?: A Lot Help from another person toileting, which includes using toliet, bedpan, or urinal?: Total Help from another person bathing (including washing, rinsing, drying)?: A Lot Help from another person to put on and taking off regular upper body clothing?: A Lot Help from another person to put on and taking off regular lower body clothing?: Total 6 Click Score: 10    End of Session Equipment Utilized During Treatment: Gait belt  OT Visit Diagnosis: Unsteadiness on feet (R26.81);Muscle weakness (generalized) (M62.81);History of falling (Z91.81);Other symptoms and signs involving cognitive function;Other abnormalities of gait and mobility (R26.89)   Activity Tolerance Patient tolerated treatment well   Patient Left in chair;with call bell/phone within reach;with chair alarm set   Nurse Communication Mobility status        Time: 0981-1914 OT Time Calculation (min): 30 min  Charges: OT General Charges $OT Visit: 1 Visit OT Treatments $Self Care/Home Management : 8-22 mins  Milburn Aliment, OT/L   Acute OT Clinical Specialist Acute Rehabilitation Services Pager [phone] Office [phone]   [facility] 10/03/2023, 1:18 PM

## 2023-10-03 NOTE — Procedures (Addendum)
 Modified Barium Swallow Study  Patient Details  Name: Lori Gates MRN: [id number] Date of Birth: 1956-01-05  Today's Date: 10/03/2023  Modified Barium Swallow completed.  Full report located under Chart Review in the Imaging Section.  History of Present Illness Lori Gates is a 68 year old female who presented to ED after daughter found on floor actively seizing, CBG read "high". Continued to seize from time of EMS dispatch to arrival to ER, and was intubated for airway protection.  ETT 4/29-5/4.  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 Pt with poorly controlled IDDM, CKD stage IV, HTN, LLD, HFpEF, substance abuse (cocaine) followed by nephrology in [place].   Clinical Impression Pt presents with a mild oropharyngeal dysphagia c/b delayed swallow initiation, decreased base of tongue retraction, reduced laryngeal elevation, incomplete laryngeal closure and diminished sensation.  Pt has known immobile R VF per ENT asssessment.  These deficits resulted in silent aspiration of thin liquid at posterior trachea during the swallow.  Pt was uanble to follow instructions for cued cough to clear aspiraiton, and suspect she would not reliably execute compensatory strategies at this time if any were effective.  Contrast progessed down posterior tracheal wall and then remained static in upper trachea for the majority of the duration of the study  There transient penetration of nectar thick liquid.  There was no penetration of puree, honey, or regular solid. Pt exhibited prolonged oral phase with solid 2/2 edentulism, but acheived adequate oral  clearance.  During pill simulation pt was unable to transit tablet orally with puree.  Recommend crushing medications.   Pt appears safe to initiate a modified oral diet.    Recommend mechanical soft solids with nectar thick liquid. Pt may have ice chips and small amounts of water by spoon, in between meals only, in moderation, after good oral care, when fully awake/alert, with upright positioning and direct supervision.   DIGEST Swallow Severity Rating*  Safety: 1  Efficiency: 1  Overall Pharyngeal Swallow Severity: 1 1: mild; 2: moderate; 3: severe; 4: profound  *The Dynamic Imaging Grade of Swallowing Toxicity is standardized for the head and neck cancer population, however, demonstrates promising clinical applications across populations to standardize the clinical rating of pharyngeal swallow safety and severity.    Factors that may increase risk of adverse event in presence of aspiration Roderick Civatte & Jessy Morocco 2021): Reduced cognitive function  Swallow Evaluation Recommendations Recommendations: PO diet PO Diet Recommendation: Dysphagia 3 (Mechanical soft);Mildly thick liquids (Level 2, nectar thick) Liquid Administration via: Cup Medication Administration: Crushed with puree Supervision: Full assist for feeding Swallowing strategies  : Slow rate;Small bites/sips Postural changes: Position pt fully upright for meals Oral care recommendations: Oral care BID (2x/day) Caregiver Recommendations: Avoid jello, ice cream, thin soups, popsicles      Elester Grim, MA, CCC-SLP Acute Rehabilitation Services Office: [phone] 10/03/2023,11:08 AM

## 2023-10-04 DIAGNOSIS — R569 Unspecified convulsions: Secondary | ICD-10-CM | POA: Diagnosis not present

## 2023-10-04 DIAGNOSIS — G9340 Encephalopathy, unspecified: Secondary | ICD-10-CM

## 2023-10-04 LAB — SURGICAL PATHOLOGY

## 2023-10-04 LAB — GLUCOSE, CAPILLARY
Glucose-Capillary: 262 mg/dL — ABNORMAL HIGH (ref 70–99)
Glucose-Capillary: 252 mg/dL — ABNORMAL HIGH (ref 70–99)
Glucose-Capillary: 137 mg/dL — ABNORMAL HIGH (ref 70–99)
Glucose-Capillary: 170 mg/dL — ABNORMAL HIGH (ref 70–99)
Glucose-Capillary: 190 mg/dL — ABNORMAL HIGH (ref 70–99)
Glucose-Capillary: 271 mg/dL — ABNORMAL HIGH (ref 70–99)
Glucose-Capillary: 317 mg/dL — ABNORMAL HIGH (ref 70–99)

## 2023-10-04 LAB — RENAL FUNCTION PANEL
Albumin: 2.2 g/dL — ABNORMAL LOW (ref 3.5–5.0)
Anion gap: 9 (ref 5–15)
BUN: 36 mg/dL — ABNORMAL HIGH (ref 8–23)
CO2: 26 mmol/L (ref 22–32)
Calcium: 8.2 mg/dL — ABNORMAL LOW (ref 8.9–10.3)
Chloride: 108 mmol/L (ref 98–111)
Creatinine, Ser: 2.41 mg/dL — ABNORMAL HIGH (ref 0.44–1.00)
GFR, Estimated: 21 mL/min — ABNORMAL LOW (ref >60)
Glucose, Bld: 253 mg/dL — ABNORMAL HIGH (ref 70–99)
Phosphorus: 3.3 mg/dL (ref 2.5–4.6)
Potassium: 4.1 mmol/L (ref 3.5–5.1)
Sodium: 143 mmol/L (ref 135–145)

## 2023-10-04 LAB — CBC
HCT: 27.8 % — ABNORMAL LOW (ref 36.0–46.0)
Hemoglobin: 8.7 g/dL — ABNORMAL LOW (ref 12.0–15.0)
MCH: 30.4 pg (ref 26.0–34.0)
MCHC: 31.3 g/dL (ref 30.0–36.0)
MCV: 97.2 fL (ref 80.0–100.0)
Platelets: 327 10*3/uL (ref 150–400)
RBC: 2.86 MIL/uL — ABNORMAL LOW (ref 3.87–5.11)
RDW: 12.7 % (ref 11.5–15.5)
WBC: 7.2 10*3/uL (ref 4.0–10.5)
nRBC: 0 % (ref 0.0–0.2)

## 2023-10-04 MED ORDER — ACETAMINOPHEN 325 MG PO TABS
650.0000 mg | ORAL_TABLET | ORAL | Status: DC | PRN
Start: 2023-10-03 — End: 2023-10-20
  Administered 2023-10-12 – 2023-10-14 (×5): 650 mg via ORAL
  Filled 2023-10-04 (×5): qty 2

## 2023-10-04 MED ORDER — METOPROLOL TARTRATE 50 MG PO TABS
50.0000 mg | ORAL_TABLET | Freq: Two times a day (BID) | ORAL | Status: DC
  Administered 2023-10-04: 50 mg via ORAL
  Filled 2023-10-04: qty 1

## 2023-10-04 MED ORDER — INSULIN ASPART 100 UNIT/ML IJ SOLN
0.0000 [IU] | Freq: Three times a day (TID) | INTRAMUSCULAR | Status: DC
  Administered 2023-10-04 (×2): 4 [IU] via SUBCUTANEOUS
  Administered 2023-10-05: 11 [IU] via SUBCUTANEOUS
  Administered 2023-10-06: 4 [IU] via SUBCUTANEOUS
  Administered 2023-10-07: 3 [IU] via SUBCUTANEOUS
  Administered 2023-10-07 (×2): 4 [IU] via SUBCUTANEOUS
  Administered 2023-10-08 (×2): 3 [IU] via SUBCUTANEOUS
  Administered 2023-10-08: 11 [IU] via SUBCUTANEOUS
  Administered 2023-10-09: 7 [IU] via SUBCUTANEOUS
  Administered 2023-10-09: 15 [IU] via SUBCUTANEOUS
  Administered 2023-10-09: 4 [IU] via SUBCUTANEOUS
  Administered 2023-10-10: 7 [IU] via SUBCUTANEOUS
  Administered 2023-10-10: 4 [IU] via SUBCUTANEOUS
  Administered 2023-10-11: 3 [IU] via SUBCUTANEOUS
  Administered 2023-10-11: 15 [IU] via SUBCUTANEOUS
  Administered 2023-10-12: 11 [IU] via SUBCUTANEOUS
  Administered 2023-10-12: 4 [IU] via SUBCUTANEOUS
  Administered 2023-10-12: 2 [IU] via SUBCUTANEOUS
  Administered 2023-10-13: 4 [IU] via SUBCUTANEOUS
  Administered 2023-10-13: 7 [IU] via SUBCUTANEOUS
  Administered 2023-10-14: 4 [IU] via SUBCUTANEOUS
  Administered 2023-10-14: 3 [IU] via SUBCUTANEOUS
  Administered 2023-10-14: 4 [IU] via SUBCUTANEOUS
  Administered 2023-10-15: 3 [IU] via SUBCUTANEOUS
  Administered 2023-10-15: 4 [IU] via SUBCUTANEOUS
  Administered 2023-10-15: 7 [IU] via SUBCUTANEOUS
  Administered 2023-10-16: 4 [IU] via SUBCUTANEOUS
  Administered 2023-10-16: 7 [IU] via SUBCUTANEOUS
  Administered 2023-10-17: 11 [IU] via SUBCUTANEOUS
  Administered 2023-10-17: 4 [IU] via SUBCUTANEOUS
  Administered 2023-10-18: 7 [IU] via SUBCUTANEOUS
  Administered 2023-10-18 – 2023-10-19 (×2): 11 [IU] via SUBCUTANEOUS
  Administered 2023-10-19: 4 [IU] via SUBCUTANEOUS
  Administered 2023-10-19: 7 [IU] via SUBCUTANEOUS

## 2023-10-04 MED ORDER — ATORVASTATIN CALCIUM 80 MG PO TABS
80.0000 mg | ORAL_TABLET | Freq: Every day | ORAL | Status: DC
Start: 2023-10-04 — End: 2023-10-20
  Administered 2023-10-04 – 2023-10-19 (×16): 80 mg via ORAL
  Filled 2023-10-04 (×16): qty 1

## 2023-10-04 MED ORDER — PANTOPRAZOLE SODIUM 40 MG PO TBEC
40.0000 mg | DELAYED_RELEASE_TABLET | Freq: Every day | ORAL | Status: DC
  Administered 2023-10-05 – 2023-10-19 (×15): 40 mg via ORAL
  Filled 2023-10-04 (×15): qty 1

## 2023-10-04 MED ORDER — POLYETHYLENE GLYCOL 3350 17 G PO PACK
17.0000 g | PACK | Freq: Every day | ORAL | Status: DC
  Administered 2023-10-04 – 2023-10-12 (×3): 17 g via ORAL
  Filled 2023-10-04 (×6): qty 1

## 2023-10-04 MED ORDER — TORSEMIDE 20 MG PO TABS
20.0000 mg | ORAL_TABLET | Freq: Two times a day (BID) | ORAL | Status: DC
  Administered 2023-10-04 – 2023-10-06 (×6): 20 mg via ORAL
  Filled 2023-10-04 (×7): qty 1

## 2023-10-04 MED ORDER — INSULIN GLARGINE-YFGN 100 UNIT/ML ~~LOC~~ SOLN
10.0000 [IU] | Freq: Every day | SUBCUTANEOUS | Status: DC
  Filled 2023-10-04: qty 0.1

## 2023-10-04 MED ORDER — LEVETIRACETAM 500 MG PO TABS
500.0000 mg | ORAL_TABLET | Freq: Two times a day (BID) | ORAL | Status: DC
  Administered 2023-10-04 – 2023-10-15 (×23): 500 mg via ORAL
  Filled 2023-10-04 (×24): qty 1

## 2023-10-04 MED ORDER — THIAMINE MONONITRATE 100 MG PO TABS
100.0000 mg | ORAL_TABLET | Freq: Every day | ORAL | Status: DC
  Administered 2023-10-04 – 2023-10-19 (×16): 100 mg via ORAL
  Filled 2023-10-04 (×16): qty 1

## 2023-10-04 MED ORDER — INSULIN ASPART 100 UNIT/ML IJ SOLN
0.0000 [IU] | Freq: Every day | INTRAMUSCULAR | Status: DC
  Administered 2023-10-04: 3 [IU] via SUBCUTANEOUS
  Administered 2023-10-08 – 2023-10-17 (×4): 2 [IU] via SUBCUTANEOUS

## 2023-10-04 MED ORDER — POLYETHYLENE GLYCOL 3350 17 G PO PACK: 17.0000 g | PACK | Freq: Every day | ORAL | Status: DC | PRN

## 2023-10-04 MED ORDER — LEVALBUTEROL HCL 0.63 MG/3ML IN NEBU
0.6300 mg | INHALATION_SOLUTION | Freq: Four times a day (QID) | RESPIRATORY_TRACT | Status: DC | PRN
Start: 2023-10-04 — End: 2023-10-20
  Administered 2023-10-10 – 2023-10-12 (×4): 0.63 mg via RESPIRATORY_TRACT
  Filled 2023-10-04 (×4): qty 3

## 2023-10-04 MED ORDER — DOCUSATE SODIUM 100 MG PO CAPS
100.0000 mg | ORAL_CAPSULE | Freq: Two times a day (BID) | ORAL | Status: DC
  Administered 2023-10-04 – 2023-10-18 (×11): 100 mg via ORAL
  Filled 2023-10-04 (×18): qty 1

## 2023-10-04 MED ORDER — METOPROLOL TARTRATE 25 MG PO TABS
25.0000 mg | ORAL_TABLET | Freq: Two times a day (BID) | ORAL | Status: DC
  Administered 2023-10-04: 25 mg via ORAL
  Filled 2023-10-04: qty 1

## 2023-10-04 MED ORDER — NIFEDIPINE ER OSMOTIC RELEASE 60 MG PO TB24
60.0000 mg | ORAL_TABLET | Freq: Every day | ORAL | Status: DC
  Administered 2023-10-04: 60 mg via ORAL
  Filled 2023-10-04 (×2): qty 1

## 2023-10-04 MED ORDER — NIFEDIPINE ER OSMOTIC RELEASE 30 MG PO TB24
30.0000 mg | ORAL_TABLET | Freq: Every day | ORAL | Status: DC
  Filled 2023-10-04: qty 1

## 2023-10-04 MED ORDER — DOCUSATE SODIUM 100 MG PO CAPS: 100.0000 mg | ORAL_CAPSULE | Freq: Two times a day (BID) | ORAL | Status: DC | PRN

## 2023-10-04 NOTE — Progress Notes (Signed)
 Physical Medicine and Rehabilitation Consult Reason for Consult: Impaired functional mobility and confusion Referring Physician: Jonelle Neri   HPI: Lori Gates is a 68 y.o. female with a history of diabetes, asthma, hypertension who was found down and seizing on 09/24/2023.  Her blood sugar was greater than 700.  Patient was emergently intubated.  UDS was positive for cocaine and benzodiazepines.  Patient was admitted for DKA, seizures and urosepsis.  Hemodialysis was initiated on Sep 26, 2023.  MRI of the brain on revealed punctate foci of acute/early subacute infarcts involving the subcortical white matter of the left postcentral gyrus.  On May 7 she had excision of the left true vocal cord polyp.  Patient continues to be followed by nephrology for acute on chronic kidney disease.  Patient is on Keppra  for recent seizures.  Mental status has shown some improvement.  Patient was up with therapy yesterday and was mod assist for sit to stand and stand pivot transfers.  She has not walked yet but did stand.  Knees were buckling and required blocking.  From a standpoint of her ADLs she has been near the mod assist level for basic activities.  Patient lives at home with her children.  Patient was modified independent using a rolling walker prior to admission.  She had a sedentary lifestyle.  Patient was independent for her basic activities of living.    Home: Home Living Family/patient expects to be discharged to:: Private residence Living Arrangements: Children Available Help at Discharge: Family, Available PRN/intermittently (daughter works) Type of Home: House Home Equipment: Agricultural consultant (2 wheels) Additional Comments: grandson present, pt unreliable historian  Functional History: Prior Function Prior Level of Function : Independent/Modified Independent Mobility Comments: uses RW, 2 other falls. sedentary lifestyle. ADLs Comments: independent with ADLs, daughter does IADLs Functional  Status:  Mobility: Bed Mobility Overal bed mobility: Needs Assistance Bed Mobility: Rolling, Supine to Sit, Sit to Supine Rolling: Mod assist Supine to sit: Mod assist, +2 for physical assistance, +2 for safety/equipment Sit to supine: Mod assist, +2 for safety/equipment, +2 for physical assistance General bed mobility comments: modA to manage LE movement and elevation of trunk. Transfers Overall transfer level: Needs assistance Equipment used: 2 person hand held assist Transfers: Sit to/from Stand, Bed to chair/wheelchair/BSC Sit to Stand: +2 physical assistance, +2 safety/equipment, From elevated surface, Mod assist Bed to/from chair/wheelchair/BSC transfer type:: Stand pivot Stand pivot transfers: Mod assist, +2 physical assistance, +2 safety/equipment General transfer comment: Needs incr cues to initiate movement with mod assist to power up.  Once up, then min assist to maintain standing. Provided blocking bil knees prn without significant buckling noted and assist pt at buttocks to stand tall and step around from bed to 3n1. Once pt urinated, assist 3N1 to recliner with same assist. Ambulation/Gait General Gait Details: TBA    ADL: ADL Overall ADL's : Needs assistance/impaired Eating/Feeding: Moderate assistance Eating/Feeding Details (indicate cue type and reason): nectar thick Grooming: Moderate assistance Upper Body Bathing: Moderate assistance Lower Body Bathing: Maximal assistance, Sit to/from stand Upper Body Dressing : Maximal assistance, Sitting Lower Body Dressing: Total assistance Toilet Transfer: Maximal assistance, Stand-pivot Toileting- Clothing Manipulation and Hygiene: Total assistance Functional mobility during ADLs: Maximal assistance  Cognition: Cognition Orientation Level: Oriented to person Cognition Arousal: Lethargic, Alert Behavior During Therapy: Lability, Flat affect, Anxious   Review of Systems  Unable to perform ROS: Mental acuity   Past  Medical History:  Diagnosis Date   Asthma    Diabetes  mellitus without complication (HCC)    Hypertension    Past Surgical History:  Procedure Laterality Date   MICROLARYNGOSCOPY WITH LASER Left 10/02/2023   Procedure: DIRECT MICROLARYNGOSCOPY, BRONCHOSCOPY, CO2 LASER EXCISION OF LEFT VOCAL FOLD POLYP/MASS;  Surgeon: Artice Last, MD;  Location: [facility] OR;  Service: ENT;  Laterality: Left;   RIGID BRONCHOSCOPY N/A 10/02/2023   Procedure: Francesco Inks;  Surgeon: Artice Last, MD;  Location: [facility] OR;  Service: ENT;  Laterality: N/A;   TUBAL LIGATION     History reviewed. No pertinent family history. Social History:  reports that she has quit smoking. Her smoking use included cigarettes. She does not have any smokeless tobacco history on file. She reports that she does not drink alcohol. No history on file for drug use. Allergies: No Known Allergies Medications Prior to Admission  Medication Sig Dispense Refill   albuterol -ipratropium (COMBIVENT) 18-103 MCG/ACT inhaler Inhale 2 puffs into the lungs every 6 (six) hours as needed. For shortness of breath     amLODipine  (NORVASC ) 5 MG tablet Take 5 mg by mouth daily.     atorvastatin  (LIPITOR ) 80 MG tablet Take 80 mg by mouth daily.     ibuprofen (ADVIL,MOTRIN) 200 MG tablet Take 400 mg by mouth every 6 (six) hours as needed. For pain     insulin  lispro (HUMALOG) 100 UNIT/ML KwikPen Inject 13 Units into the skin 3 (three) times daily.     LANTUS  SOLOSTAR 100 UNIT/ML Solostar Pen Inject 20 Units into the skin at bedtime.     nebivolol (BYSTOLIC) 2.5 MG tablet Take 2.5 mg by mouth daily.     ondansetron  (ZOFRAN ) 4 MG tablet Take 1 tablet (4 mg total) by mouth every 8 (eight) hours as needed for nausea. 10 tablet 0   RYBELSUS 3 MG TABS Take 2 tablets by mouth daily.     thiamine  (VITAMIN B-1) 100 MG tablet Take 100 mg by mouth daily.     torsemide  (DEMADEX ) 20 MG tablet Take 20 mg by mouth daily.       Blood pressure (!) 158/73, pulse  63, temperature (!) 97.4 F (36.3 C), temperature source Oral, resp. rate 18, height 5\' 5"  (1.651 m), weight 61 kg, SpO2 97%. Physical Exam Constitutional:      General: She is not in acute distress. HENT:     Head: Normocephalic and atraumatic.     Nose: Nose normal.     Mouth/Throat:     Comments: edentulous Eyes:     Extraocular Movements: Extraocular movements intact.     Pupils: Pupils are equal, round, and reactive to light.  Cardiovascular:     Rate and Rhythm: Normal rate and regular rhythm.  Pulmonary:     Effort: Pulmonary effort is normal.  Abdominal:     Palpations: Abdomen is soft.  Musculoskeletal:        General: No swelling or tenderness.     Cervical back: Normal range of motion.  Skin:    General: Skin is warm and dry.  Neurological:     Mental Status: She is alert.     Comments: Pt is oriented to person only. Speech is very dysarthric, aided by missing teeth. Kept referring to her old job, (may have been with [employer]?), and people she worked with. Could follow basic commands. When we moved her left arm she referred to it being weak from prior stroke. CN exam non-focal. RUE and RLE grossly 4 to 4+/5 prox to distal. LUE was 3 to 3+/5 and  LLE was 4- to 4/5. Seemed to sense pain in all 4's. No abnl resting tone.   Psychiatric:     Comments: Pt pleasant but distracted.      Results for orders placed or performed during the hospital encounter of 09/24/23 (from the past 24 hours)  Glucose, capillary     Status: None   Collection Time: 10/03/23 12:01 PM  Result Value Ref Range   Glucose-Capillary 94 70 - 99 mg/dL  Glucose, capillary     Status: Abnormal   Collection Time: 10/03/23  4:21 PM  Result Value Ref Range   Glucose-Capillary 159 (H) 70 - 99 mg/dL  Glucose, capillary     Status: Abnormal   Collection Time: 10/03/23  7:41 PM  Result Value Ref Range   Glucose-Capillary 147 (H) 70 - 99 mg/dL  Renal function panel     Status: Abnormal   Collection Time:  10/04/23  4:42 AM  Result Value Ref Range   Sodium 143 135 - 145 mmol/L   Potassium 4.1 3.5 - 5.1 mmol/L   Chloride 108 98 - 111 mmol/L   CO2 26 22 - 32 mmol/L   Glucose, Bld 253 (H) 70 - 99 mg/dL   BUN 36 (H) 8 - 23 mg/dL   Creatinine, Ser 1.61 (H) 0.44 - 1.00 mg/dL   Calcium  8.2 (L) 8.9 - 10.3 mg/dL   Phosphorus 3.3 2.5 - 4.6 mg/dL   Albumin 2.2 (L) 3.5 - 5.0 g/dL   GFR, Estimated 21 (L) >60 mL/min   Anion gap 9 5 - 15  CBC     Status: Abnormal   Collection Time: 10/04/23  4:42 AM  Result Value Ref Range   WBC 7.2 4.0 - 10.5 K/uL   RBC 2.86 (L) 3.87 - 5.11 MIL/uL   Hemoglobin 8.7 (L) 12.0 - 15.0 g/dL   HCT 09.6 (L) 04.5 - 40.9 %   MCV 97.2 80.0 - 100.0 fL   MCH 30.4 26.0 - 34.0 pg   MCHC 31.3 30.0 - 36.0 g/dL   RDW 81.1 91.4 - 78.2 %   Platelets 327 150 - 400 K/uL   nRBC 0.0 0.0 - 0.2 %  Glucose, capillary     Status: Abnormal   Collection Time: 10/04/23  5:33 AM  Result Value Ref Range   Glucose-Capillary 262 (H) 70 - 99 mg/dL  Glucose, capillary     Status: Abnormal   Collection Time: 10/04/23  7:46 AM  Result Value Ref Range   Glucose-Capillary 252 (H) 70 - 99 mg/dL   DG Swallowing Func-Speech Pathology Result Date: 10/03/2023 Table formatting from the original result was not included. Modified Barium Swallow Study Patient Details Name: Lori Gates MRN: [id number] Date of Birth: 12/26/1955 Today's Date: 10/03/2023 HPI/PMH: HPI: Ayoka Spake is a 68 year old female who presented to ED after daughter found on floor actively seizing, CBG read "high". Continued to seize from time of EMS dispatch to arrival to ER, and was intubated for airway protection.  ETT 4/29-5/4.  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 "Stable right basilar atelectasis or infiltrate is noted."  ENT scoped 5/6 "with no movement seen on the right side (suspect R VF paralysis), left VF with a  large hemorrhagic polyp along left vocal fold."  S/p VF surgery with Dr Larkin Plumb 5/7. Pt with poorly controlled IDDM, CKD stage IV, HTN, LLD, HFpEF, substance abuse (cocaine) followed  by nephrology in [place]. Clinical Impression: Pt presents with a mild oropharyngeal dysphagia c/b delayed swallow initiation, decreased base of tongue retraction, reduced laryngeal elevation, incomplete laryngeal closure and diminished sensation.  Pt has known immobile R VF per ENT asssessment.  These deficits resulted in silent aspiration of thin liquid at posterior trachea during the swallow.  Pt was uanble to follow instructions for cued cough to clear aspiraiton, and suspect she would not reliably execute compensatory strategies at this time if any were effective.  Contrast progessed down posterior tracheal wall and then remained static in upper trachea for the majority of the duration of the study  There transient penetration of nectar thick liquid.  There was no penetration of puree, honey, or regular solid. Pt exhibited prolonged oral phase with solid 2/2 edentulism, but acheived adequate oral clearance.  During pill simulation pt was unable to transit tablet orally with puree.  Recommend crushing medications.   Pt appears safe to initiate a modified oral diet.  Recommend mechanical soft solids with nectar thick liquid. DIGEST Swallow Severity Rating*  Safety: 1  Efficiency: 1  Overall Pharyngeal Swallow Severity: 1 1: mild; 2: moderate; 3: severe; 4: profound *The Dynamic Imaging Grade of Swallowing Toxicity is standardized for the head and neck cancer population, however, demonstrates promising clinical applications across populations to standardize the clinical rating of pharyngeal swallow safety and severity. Factors that may increase risk of adverse event in presence of aspiration Roderick Civatte & Jessy Morocco 2021): Factors that may increase risk of adverse event in presence of aspiration Roderick Civatte & Jessy Morocco 2021): Reduced  cognitive function Recommendations/Plan: Swallowing Evaluation Recommendations Swallowing Evaluation Recommendations Recommendations: PO diet PO Diet Recommendation: Dysphagia 3 (Mechanical soft); Mildly thick liquids (Level 2, nectar thick) Liquid Administration via: Cup Medication Administration: Crushed with puree Supervision: Full assist for feeding Swallowing strategies  : Slow rate; Small bites/sips Postural changes: Position pt fully upright for meals Oral care recommendations: Oral care BID (2x/day) Caregiver Recommendations: Avoid jello, ice cream, thin soups, popsicles Treatment Plan Treatment Plan Treatment recommendations: Therapy as outlined in treatment plan below Follow-up recommendations: -- (ST at next level of care) Functional status assessment: Patient has had a recent decline in their functional status and demonstrates the ability to make significant improvements in function in a reasonable and predictable amount of time. Treatment frequency: Min 2x/week Treatment duration: 2 weeks Interventions: Diet toleration management by SLP; Patient/family education (Exercises if pt becomes able to participate) Recommendations Recommendations for follow up therapy are one component of a multi-disciplinary discharge planning process, led by the attending physician.  Recommendations may be updated based on patient status, additional functional criteria and insurance authorization. Assessment: Orofacial Exam: Orofacial Exam Oral Cavity: Oral Hygiene: WFL Oral Cavity - Dentition: Edentulous; Dentures, not available Orofacial Anatomy: WFL Oral Motor/Sensory Function: -- (See BSE) Anatomy: Anatomy: Suspected cervical osteophytes (without impact on sw fn) Boluses Administered: Boluses Administered Boluses Administered: Thin liquids (Level 0); Mildly thick liquids (Level 2, nectar thick); Moderately thick liquids (Level 3, honey thick); Puree; Solid  Oral Impairment Domain: Oral Impairment Domain Lip Closure:  Interlabial escape, no progression to anterior lip Tongue control during bolus hold: Cohesive bolus between tongue to palatal seal Bolus preparation/mastication: Slow prolonged chewing/mashing with complete recollection Bolus transport/lingual motion: Brisk tongue motion Oral residue: Trace residue lining oral structures Location of oral residue : Tongue Initiation of pharyngeal swallow : Pyriform sinuses  Pharyngeal Impairment Domain: Pharyngeal Impairment Domain Soft palate elevation: No bolus between soft palate (SP)/pharyngeal wall (PW) Laryngeal  elevation: Partial superior movement of thyroid cartilage/partial approximation of arytenoids to epiglottic petiole Anterior hyoid excursion: Complete anterior movement Epiglottic movement: Complete inversion Laryngeal vestibule closure: Incomplete, narrow column air/contrast in laryngeal vestibule Pharyngeal stripping wave : Present - diminished Pharyngeal contraction (A/P view only): N/A Pharyngoesophageal segment opening: Complete distension and complete duration, no obstruction of flow Tongue base retraction: Trace column of contrast or air between tongue base and PPW Pharyngeal residue: Trace residue within or on pharyngeal structures Location of pharyngeal residue: Valleculae  Esophageal Impairment Domain: Esophageal Impairment Domain Esophageal clearance upright position: -- (Not assessed) Pill: Pill Consistency administered: Puree Puree: Impaired (see clinical impressions) (Unable to transit orally) Penetration/Aspiration Scale Score: Penetration/Aspiration Scale Score 1.  Material does not enter airway: Moderately thick liquids (Level 3, honey thick); Puree; Solid 2.  Material enters airway, remains ABOVE vocal cords then ejected out: Mildly thick liquids (Level 2, nectar thick) 8.  Material enters airway, passes BELOW cords without attempt by patient to eject out (silent aspiration) : Thin liquids (Level 0) Compensatory Strategies: Compensatory Strategies  Compensatory strategies: No   General Information: Caregiver present: No  Diet Prior to this Study: NPO   No data recorded  No data recorded  Supplemental O2: Nasal cannula   History of Recent Intubation: Yes  Behavior/Cognition: Alert; Cooperative Self-Feeding Abilities: Needs assist with self-feeding Baseline vocal quality/speech: Aphonic; Dysphonic Volitional Cough: Unable to elicit No data recorded Exam Limitations: -- (Unable to follow directions for compensatory strategies) Goal Planning: Prognosis for improved oropharyngeal function: Guarded No data recorded No data recorded No data recorded Consulted and agree with results and recommendations: Patient; Nurse Pain: Pain Assessment Breathing: 0 Negative Vocalization: 0 Facial Expression: 0 Body Language: 0 Consolability: 0 PAINAD Score: 0 End of Session: Start Time:SLP Start Time (ACUTE ONLY): 1009 Stop Time: SLP Stop Time (ACUTE ONLY): 1021 Time Calculation:SLP Time Calculation (min) (ACUTE ONLY): 12 min Charges: SLP Evaluations $ SLP Speech Visit: 1 Visit SLP Evaluations $MBS Swallow: 1 Procedure $Swallowing Treatment: 1 Procedure SLP visit diagnosis: SLP Visit Diagnosis: Dysphagia, oropharyngeal phase (R13.12) Past Medical History: Past Medical History: Diagnosis Date  Asthma   Diabetes mellitus without complication (HCC)   Hypertension  Past Surgical History: Past Surgical History: Procedure Laterality Date  MICROLARYNGOSCOPY WITH LASER Left 10/02/2023  Procedure: DIRECT MICROLARYNGOSCOPY, BRONCHOSCOPY, CO2 LASER EXCISION OF LEFT VOCAL FOLD POLYP/MASS;  Surgeon: Artice Last, MD;  Location: [facility] OR;  Service: ENT;  Laterality: Left;  RIGID BRONCHOSCOPY N/A 10/02/2023  Procedure: Francesco Inks;  Surgeon: Artice Last, MD;  Location: [facility] OR;  Service: ENT;  Laterality: N/A;  TUBAL LIGATION   Elester Grim, MA, CCC-SLP Acute Rehabilitation Services Office: [phone] 10/03/2023, 11:12 AM  DG Chest Port 1 View Result Date: 10/02/2023 CLINICAL DATA:   Acute respiratory failure with hypoxia. EXAM: PORTABLE CHEST 1 VIEW COMPARISON:  Oct 01, 2023. FINDINGS: Stable cardiomediastinal silhouette. Left retrocardiac opacity is noted concerning for pneumonia or atelectasis with small associated effusion. Minimal right basilar subsegmental atelectasis is noted. Endotracheal and feeding tubes are in good position. Right internal jugular catheter is unchanged. IMPRESSION: Left retrocardiac opacity is noted concerning for pneumonia or atelectasis with small pleural effusion. Minimal right basilar subsegmental atelectasis. Electronically Signed   By: Rosalene Colon M.D.   On: 10/02/2023 14:33    Assessment/Plan: Diagnosis: 68 year old female with debility and encephalopathy after diabetic ketoacidosis seizures and urosepsis. Does the need for close, 24 hr/day medical supervision in concert with the patient's rehab needs make it unreasonable for this  patient to be served in a less intensive setting? Yes Co-Morbidities requiring supervision/potential complications:  - Acute on chronic stage IV kidney disease -Diabetic ketoacidosis -Hypertension Due to bladder management, bowel management, safety, skin/wound care, disease management, medication administration, pain management, and patient education, does the patient require 24 hr/day rehab nursing? Yes Does the patient require coordinated care of a physician, rehab nurse, therapy disciplines of PT, OT, SLP to address physical and functional deficits in the context of the above medical diagnosis(es)? Yes Addressing deficits in the following areas: balance, endurance, locomotion, strength, transferring, bowel/bladder control, bathing, dressing, feeding, grooming, toileting, cognition, speech, swallowing, and psychosocial support Can the patient actively participate in an intensive therapy program of at least 3 hrs of therapy per day at least 5 days per week? Yes The potential for patient to make measurable gains while  on inpatient rehab is excellent Anticipated functional outcomes upon discharge from inpatient rehab are min assist  with PT, min assist and mod assist with OT, min assist and mod assist with SLP. Estimated rehab length of stay to reach the above functional goals is: 20-25 days Anticipated discharge destination: Home Overall Rehab/Functional Prognosis: good  POST ACUTE RECOMMENDATIONS: This patient's condition is appropriate for continued rehabilitative care in the following setting: CIR Patient has agreed to participate in recommended program. N/A Note that insurance prior authorization may be required for reimbursement for recommended care.  Comment: Pt will need physical assistance once home. She was mod I with mobility and bADL's prior to this admit albeit a bit sedentary. Rehab Admissions Coordinator to follow up.      I have personally performed a face to face diagnostic evaluation of this patient. Additionally, I have examined the patient's medical record including any pertinent labs and radiographic images.    Thanks,  Rawland Caddy, MD 10/04/2023

## 2023-10-04 NOTE — Plan of Care (Signed)
  Problem: Education: Goal: Knowledge of General Education information will improve Description: Including pain rating scale, medication(s)/side effects and non-pharmacologic comfort measures Outcome: Progressing   Problem: Health Behavior/Discharge Planning: Goal: Ability to manage health-related needs will improve Outcome: Progressing   Problem: Clinical Measurements: Goal: Ability to maintain clinical measurements within normal limits will improve Outcome: Progressing Goal: Will remain free from infection Outcome: Progressing Goal: Diagnostic test results will improve Outcome: Progressing Goal: Respiratory complications will improve Outcome: Progressing Goal: Cardiovascular complication will be avoided Outcome: Progressing   Problem: Activity: Goal: Risk for activity intolerance will decrease Outcome: Progressing   Problem: Nutrition: Goal: Adequate nutrition will be maintained Outcome: Progressing   Problem: Coping: Goal: Level of anxiety will decrease Outcome: Progressing   Problem: Elimination: Goal: Will not experience complications related to bowel motility Outcome: Progressing Goal: Will not experience complications related to urinary retention Outcome: Progressing   Problem: Pain Managment: Goal: General experience of comfort will improve and/or be controlled Outcome: Progressing   Problem: Safety: Goal: Ability to remain free from injury will improve Outcome: Progressing   Problem: Skin Integrity: Goal: Risk for impaired skin integrity will decrease Outcome: Progressing   Problem: Education: Goal: Ability to describe self-care measures that may prevent or decrease complications (Diabetes Survival Skills Education) will improve Outcome: Progressing Goal: Individualized Educational Video(s) Outcome: Progressing   Problem: Coping: Goal: Ability to adjust to condition or change in health will improve Outcome: Progressing   Problem: Fluid  Volume: Goal: Ability to maintain a balanced intake and output will improve Outcome: Progressing   Problem: Health Behavior/Discharge Planning: Goal: Ability to identify and utilize available resources and services will improve Outcome: Progressing Goal: Ability to manage health-related needs will improve Outcome: Progressing   Problem: Metabolic: Goal: Ability to maintain appropriate glucose levels will improve Outcome: Progressing   Problem: Nutritional: Goal: Maintenance of adequate nutrition will improve Outcome: Progressing Goal: Progress toward achieving an optimal weight will improve Outcome: Progressing   Problem: Skin Integrity: Goal: Risk for impaired skin integrity will decrease Outcome: Progressing   Problem: Tissue Perfusion: Goal: Adequacy of tissue perfusion will improve Outcome: Progressing   Problem: Activity: Goal: Ability to tolerate increased activity will improve Outcome: Progressing   Problem: Respiratory: Goal: Ability to maintain a clear airway and adequate ventilation will improve Outcome: Progressing   Problem: Role Relationship: Goal: Method of communication will improve Outcome: Progressing   Problem: Education: Goal: Ability to describe self-care measures that may prevent or decrease complications (Diabetes Survival Skills Education) will improve Outcome: Progressing Goal: Individualized Educational Video(s) Outcome: Progressing   Problem: Cardiac: Goal: Ability to maintain an adequate cardiac output will improve Outcome: Progressing   Problem: Health Behavior/Discharge Planning: Goal: Ability to identify and utilize available resources and services will improve Outcome: Progressing Goal: Ability to manage health-related needs will improve Outcome: Progressing   Problem: Fluid Volume: Goal: Ability to achieve a balanced intake and output will improve Outcome: Progressing   Problem: Metabolic: Goal: Ability to maintain  appropriate glucose levels will improve Outcome: Progressing   Problem: Nutritional: Goal: Maintenance of adequate nutrition will improve Outcome: Progressing Goal: Maintenance of adequate weight for body size and type will improve Outcome: Progressing   Problem: Respiratory: Goal: Will regain and/or maintain adequate ventilation Outcome: Progressing   Problem: Urinary Elimination: Goal: Ability to achieve and maintain adequate renal perfusion and functioning will improve Outcome: Progressing

## 2023-10-04 NOTE — Care Management Important Message (Signed)
 Important Message  Patient Details  Name: Lori Gates MRN: [id number] Date of Birth: February 22, 1956   Important Message Given:  Yes - Medicare IM     Wynonia Hedges 10/04/2023, 11:59 AM

## 2023-10-04 NOTE — Progress Notes (Signed)
 Speech Language Pathology Treatment: Dysphagia  Patient Details Name: Lori Gates MRN: [id number] DOB: 06-Oct-1955 Today's Date: 10/04/2023 Time: 4098-1191 SLP Time Calculation (min) (ACUTE ONLY): 12 min  Assessment / Plan / Recommendation Clinical Impression  Pt was alert throughout the session but continues to demonstrate difficulty with following commands, particularly to cough. She took straw sips of nectar thick liquids without overt s/s of aspiration, although note aspiration was silent on the MBS 5/8 with thin liquids and penetration was transient. Mastication remains mildly prolonged with solids which is likely secondary to edentulism, although she achieves complete oral clearance. Recommend continuing current diet for now with ongoing SLP f/u to assess readiness to repeat MBS before advancing diet.     HPI HPI: Lori Gates is a 68 year old female who presented to ED after daughter found on floor actively seizing, CBG read "high". Continued to seize from time of EMS dispatch to arrival to ER, and was intubated for airway protection.  ETT 4/29-5/4.  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 Pt with poorly controlled IDDM, CKD stage IV, HTN, LLD, HFpEF, substance abuse (cocaine) followed by nephrology in [place].      SLP Plan  Continue with current plan of care      Recommendations for follow up therapy are one component of a multi-disciplinary discharge planning process, led by the attending physician.  Recommendations may be updated based on patient status, additional functional criteria and insurance authorization.     Recommendations  Diet recommendations: Dysphagia 3 (mechanical soft);Nectar-thick liquid Liquids provided via: Cup;Straw Medication Administration: Whole meds with puree Supervision: Staff to assist with self feeding;Full supervision/cueing for compensatory strategies Compensations: Minimize environmental distractions;Slow rate;Small sips/bites Postural Changes and/or Swallow Maneuvers: Seated upright 90 degrees                  Oral care BID   Frequent or constant Supervision/Assistance Dysphagia, oropharyngeal phase (R13.12)     Continue with current plan of care     Amil Kale, M.A., CCC-SLP Speech Language Pathology, Acute Rehabilitation Services  Secure Chat preferred [phone]   10/04/2023, 12:03 PM

## 2023-10-04 NOTE — Progress Notes (Signed)
 Patient ID: Lori Gates, female   DOB: [birth year], 68 y.o.   MRN: [id number] [facility] Progress Note   Assessment/ Plan:   1. Acute kidney Injury on chronic kidney disease stage IV: Baseline creatinine ranging 2.5-2.8.  She continues to maintain stable labs/renal function and has not had any additional needs for dialysis over the past week.  Urine output suboptimally quantified on torsemide  20 mg twice daily with weight generally trending down.  Continue current diuretics/lab monitoring and will plan for removal of dialysis catheter tomorrow if she continues not to require dialysis. 2.  Diabetic ketoacidosis: Initially with coma/altered mental status that has improved with insulin  therapy/fluids.  Glycemia management per primary service. 3.  Acute metabolic encephalopathy: Appears to be bifactorial from diabetic ketoacidosis and cocaine abuse.  Mental status appears to have improved status post management of DKA.  She is on levetiracetam  with recent seizures. 4.  Hypertension: Elevated blood pressure noted, started on metoprolol  today in addition to torsemide .  Start nifedipine ER.  Subjective:   Transferred out of ICU yesterday and had uneventful night on telemetry unit.   Objective:   BP (!) 192/90 (BP Location: Right Arm)   Pulse 63   Temp (!) 97.4 F (36.3 C) (Oral)   Resp 18   Ht 5\' 5"  (1.651 m)   Wt 61 kg   SpO2 97%   BMI 22.38 kg/m   Intake/Output Summary (Last 24 hours) at 10/04/2023 0840 Last data filed at 10/04/2023 0300 Gross per 24 hour  Intake --  Output 250 ml  Net -250 ml   Weight change: -5.6 kg  Physical Exam: Gen: Sitting up comfortably in bed, being assisted with breakfast by nurse CVS: Pulse regular rhythm, normal rate, S1 and S2 normal Resp: Clear to auscultation bilaterally, no rales/rhonchi.  Temporary right IJ dialysis catheter in place Abd: Soft, obese, nontender, bowel sounds normal Ext: 1-2+ lower extremity edema with 1+ bilateral  upper extremity edema.  Left BBF with palpable thrill  Imaging: DG Swallowing Func-Speech Pathology Result Date: 10/03/2023 Table formatting from the original result was not included. Modified Barium Swallow Study Patient Details Name: Lori Gates MRN: [id number] Date of Birth: [birth year] Today's Date: 10/03/2023 HPI/PMH: HPI: Lori Gates is a 68 year old female who presented to ED after daughter found on floor actively seizing, CBG read "high". Continued to seize from time of EMS dispatch to arrival to ER, and was intubated for airway protection.  ETT 4/29-5/4.  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 Pt with poorly controlled IDDM, CKD stage IV, HTN, LLD, HFpEF, substance abuse (cocaine) followed by nephrology in [place]. Clinical Impression: Pt presents with a mild oropharyngeal dysphagia c/b delayed swallow initiation, decreased base of tongue retraction, reduced laryngeal elevation, incomplete laryngeal closure and diminished sensation.  Pt has known immobile R VF per ENT asssessment.  These deficits resulted in silent aspiration of thin liquid at posterior trachea during the swallow.  Pt was uanble to follow instructions for cued cough to clear aspiraiton, and suspect she would not reliably execute compensatory strategies at this time if any were effective.  Contrast progessed down posterior tracheal  wall and then remained static in upper trachea for the majority of the duration of the study  There transient penetration of nectar thick liquid.  There was no penetration of puree, honey, or regular solid. Pt exhibited prolonged oral phase with  solid 2/2 edentulism, but acheived adequate oral clearance.  During pill simulation pt was unable to transit tablet orally with puree.  Recommend crushing medications.   Pt appears safe to initiate a modified oral diet.  Recommend mechanical soft solids with nectar thick liquid. DIGEST Swallow Severity Rating*  Safety: 1  Efficiency: 1  Overall Pharyngeal Swallow Severity: 1 1: mild; 2: moderate; 3: severe; 4: profound *The Dynamic Imaging Grade of Swallowing Toxicity is standardized for the head and neck cancer population, however, demonstrates promising clinical applications across populations to standardize the clinical rating of pharyngeal swallow safety and severity. Factors that may increase risk of adverse event in presence of aspiration Roderick Civatte & Jessy Morocco 2021): Factors that may increase risk of adverse event in presence of aspiration Roderick Civatte & Jessy Morocco 2021): Reduced cognitive function Recommendations/Plan: Swallowing Evaluation Recommendations Swallowing Evaluation Recommendations Recommendations: PO diet PO Diet Recommendation: Dysphagia 3 (Mechanical soft); Mildly thick liquids (Level 2, nectar thick) Liquid Administration via: Cup Medication Administration: Crushed with puree Supervision: Full assist for feeding Swallowing strategies  : Slow rate; Small bites/sips Postural changes: Position pt fully upright for meals Oral care recommendations: Oral care BID (2x/day) Caregiver Recommendations: Avoid jello, ice cream, thin soups, popsicles Treatment Plan Treatment Plan Treatment recommendations: Therapy as outlined in treatment plan below Follow-up recommendations: -- (ST at next level of care) Functional status assessment: Patient has had a recent decline in their functional status and demonstrates the ability to make significant improvements in function in a reasonable and predictable amount of time. Treatment frequency: Min 2x/week Treatment duration: 2 weeks Interventions: Diet toleration management by  SLP; Patient/family education (Exercises if pt becomes able to participate) Recommendations Recommendations for follow up therapy are one component of a multi-disciplinary discharge planning process, led by the attending physician.  Recommendations may be updated based on patient status, additional functional criteria and insurance authorization. Assessment: Orofacial Exam: Orofacial Exam Oral Cavity: Oral Hygiene: WFL Oral Cavity - Dentition: Edentulous; Dentures, not available Orofacial Anatomy: WFL Oral Motor/Sensory Function: -- (See BSE) Anatomy: Anatomy: Suspected cervical osteophytes (without impact on sw fn) Boluses Administered: Boluses Administered Boluses Administered: Thin liquids (Level 0); Mildly thick liquids (Level 2, nectar thick); Moderately thick liquids (Level 3, honey thick); Puree; Solid  Oral Impairment Domain: Oral Impairment Domain Lip Closure: Interlabial escape, no progression to anterior lip Tongue control during bolus hold: Cohesive bolus between tongue to palatal seal Bolus preparation/mastication: Slow prolonged chewing/mashing with complete recollection Bolus transport/lingual motion: Brisk tongue motion Oral residue: Trace residue lining oral structures Location of oral residue : Tongue Initiation of pharyngeal swallow : Pyriform sinuses  Pharyngeal Impairment Domain: Pharyngeal Impairment Domain Soft palate elevation: No bolus between soft palate (SP)/pharyngeal wall (PW) Laryngeal elevation: Partial superior movement of thyroid cartilage/partial approximation of arytenoids to epiglottic petiole Anterior hyoid excursion: Complete anterior movement Epiglottic movement: Complete inversion Laryngeal vestibule closure: Incomplete, narrow column air/contrast in laryngeal vestibule Pharyngeal stripping wave : Present - diminished Pharyngeal contraction (A/P view only): N/A Pharyngoesophageal segment opening: Complete distension and complete duration, no obstruction of flow Tongue base  retraction: Trace column of contrast or air between tongue base and PPW Pharyngeal residue: Trace residue within or on pharyngeal structures Location of pharyngeal residue: Valleculae  Esophageal Impairment Domain: Esophageal Impairment  Domain Esophageal clearance upright position: -- (Not assessed) Pill: Pill Consistency administered: Puree Puree: Impaired (see clinical impressions) (Unable to transit orally) Penetration/Aspiration Scale Score: Penetration/Aspiration Scale Score 1.  Material does not enter airway: Moderately thick liquids (Level 3, honey thick); Puree; Solid 2.  Material enters airway, remains ABOVE vocal cords then ejected out: Mildly thick liquids (Level 2, nectar thick) 8.  Material enters airway, passes BELOW cords without attempt by patient to eject out (silent aspiration) : Thin liquids (Level 0) Compensatory Strategies: Compensatory Strategies Compensatory strategies: No   General Information: Caregiver present: No  Diet Prior to this Study: NPO   No data recorded  No data recorded  Supplemental O2: Nasal cannula   History of Recent Intubation: Yes  Behavior/Cognition: Alert; Cooperative Self-Feeding Abilities: Needs assist with self-feeding Baseline vocal quality/speech: Aphonic; Dysphonic Volitional Cough: Unable to elicit No data recorded Exam Limitations: -- (Unable to follow directions for compensatory strategies) Goal Planning: Prognosis for improved oropharyngeal function: Guarded No data recorded No data recorded No data recorded Consulted and agree with results and recommendations: Patient; Nurse Pain: Pain Assessment Breathing: 0 Negative Vocalization: 0 Facial Expression: 0 Body Language: 0 Consolability: 0 PAINAD Score: 0 End of Session: Start Time:SLP Start Time (ACUTE ONLY): 1009 Stop Time: SLP Stop Time (ACUTE ONLY): 1021 Time Calculation:SLP Time Calculation (min) (ACUTE ONLY): 12 min Charges: SLP Evaluations $ SLP Speech Visit: 1 Visit SLP Evaluations $MBS Swallow: 1  Procedure $Swallowing Treatment: 1 Procedure SLP visit diagnosis: SLP Visit Diagnosis: Dysphagia, oropharyngeal phase (R13.12) Past Medical History: Past Medical History: Diagnosis Date  Asthma   Diabetes mellitus without complication (HCC)   Hypertension  Past Surgical History: Past Surgical History: Procedure Laterality Date  MICROLARYNGOSCOPY WITH LASER Left 10/02/2023  Procedure: DIRECT MICROLARYNGOSCOPY, BRONCHOSCOPY, CO2 LASER EXCISION OF LEFT VOCAL FOLD POLYP/MASS;  Surgeon: Artice Last, MD;  Location: [facility] OR;  Service: ENT;  Laterality: Left;  RIGID BRONCHOSCOPY N/A 10/02/2023  Procedure: Francesco Inks;  Surgeon: Artice Last, MD;  Location: [facility] OR;  Service: ENT;  Laterality: N/A;  TUBAL LIGATION   Elester Grim, MA, CCC-SLP Acute Rehabilitation Services Office: [phone] 10/03/2023, 11:12 AM  DG Chest Port 1 View Result Date: 10/02/2023 CLINICAL DATA:  Acute respiratory failure with hypoxia. EXAM: PORTABLE CHEST 1 VIEW COMPARISON:  Oct 01, 2023. FINDINGS: Stable cardiomediastinal silhouette. Left retrocardiac opacity is noted concerning for pneumonia or atelectasis with small associated effusion. Minimal right basilar subsegmental atelectasis is noted. Endotracheal and feeding tubes are in good position. Right internal jugular catheter is unchanged. IMPRESSION: Left retrocardiac opacity is noted concerning for pneumonia or atelectasis with small pleural effusion. Minimal right basilar subsegmental atelectasis. Electronically Signed   By: Rosalene Colon M.D.   On: 10/02/2023 14:33    Labs: BMET Recent Labs  Lab 09/28/23 0721 09/29/23 0505 09/30/23 0631 10/01/23 0549 10/01/23 1129 10/02/23 0525 10/02/23 1402 10/03/23 0500 10/04/23 0442  NA 138 139 140 140 141 141 142 141  143 143  K 3.0* 4.1 3.9 4.0 3.5 3.9 4.0 3.9  3.9 4.1  CL 107 99 106 107 103 104  --  104  106 108  CO2 22 23 23 24 24 27   --  24  25 26   GLUCOSE 44* 318* 107* 268* 240* 225*  --  159*  162* 253*  BUN 23  35* 34* 37* 35* 37*  --  34*  33* 36*  CREATININE 1.92* 2.34* 2.23* 2.20* 2.27* 2.33*  --  2.32*  2.36* 2.41*  CALCIUM   7.9* 8.1* 8.0* 8.2* 8.2* 8.5*  --  8.6*  8.6* 8.2*  PHOS 2.2* 4.2 4.3  --   --  2.4*  --  3.1 3.3   CBC Recent Labs  Lab 09/30/23 0911 10/01/23 1129 10/02/23 1402 10/03/23 0500 10/04/23 0442  WBC 4.8 6.0  --  12.9* 7.2  NEUTROABS 3.5 2.9  --   --   --   HGB 8.5* 10.4* 8.8* 9.3* 8.7*  HCT 26.7* 32.0* 26.0* 29.5* 27.8*  MCV 95.7 94.7  --  97.0 97.2  PLT 188 267  --  331 327    Medications:     atorvastatin   80 mg Oral Daily   Chlorhexidine  Gluconate Cloth  6 each Topical Daily   docusate sodium   100 mg Oral BID   heparin   5,000 Units Subcutaneous Q8H   insulin  aspart  0-15 Units Subcutaneous TID WC   insulin  glargine-yfgn  5 Units Subcutaneous Daily   levETIRAcetam   500 mg Oral BID   metoprolol  tartrate  25 mg Oral BID   mouth rinse  15 mL Mouth Rinse 4 times per day   pantoprazole  (PROTONIX ) IV  40 mg Intravenous Daily   polyethylene glycol  17 g Oral Daily   sodium chloride  flush  10-40 mL Intracatheter Q12H   thiamine   100 mg Oral Daily   torsemide   20 mg Oral BID    Clevester Dally, MD 10/04/2023, 8:40 AM

## 2023-10-04 NOTE — Progress Notes (Signed)
 Lori Gates  ZOX:[id number] DOB: 24-May-1956 DOA: 09/24/2023 PCP: Default, Provider, MD    Brief Narrative:  68 year old with uncontrolled DM2, CKD stage IV, HTN, diastolic CHF, and cocaine abuse who was admitted to the hospital 4/29 after she was found down on the floor unresponsive and seizing and ultimately diagnosed with DKA with seizure activity.  She required endotracheal intubation upon arrival to the ER, and was admitted to the ICU.  CT head was negative for acute bleeding.  MRI brain was negative for acute process.  After correction of her DKA she was able to be extubated but suffered with stridor postextubation.  ENT was consulted and the patient was found to have a left vocal cord polyp.  She was taken to the OR by ENT with excision of the polyp and required approximately 12 hours of rest on the ventilator postoperatively before she could be reextubated.  Significant Events: 4/29 found unconscious and seizing on floor -intubated in ER -admit to ICU in DKA 5/1 EEG negative for ongoing seizures -temporary HD cath placed 5/2 HD performed 5/4 extubated -postextubation developed stridor 5/6 ongoing stridor without respiratory distress -ENT performed fiberoptic laryngoscopy showing right vocal fold paralysis and large hemorrhagic polyp on the left vocal fold  5/7 2 OR for left vocal fold polyp excision -remained intubated postop due to low tidal volumes requiring additional night in ICU 5/9 transferred to [facility]  Goals of Care:   Code Status: Full Code   DVT prophylaxis: heparin  injection 5,000 Units Start: 09/25/23 0600   Interim Hx: Afebrile.  Blood pressure elevated with systolics as high as 192.  Vitals otherwise stable.  CBG climbing with present measurements 252 and 262.  Resting comfortably in bed at the time of visit.  Is alert but mildly confused.  Cannot explain to me where she is or why she is here.  In no distress.  Was  Assessment & Plan:  Acute hypoxic and  hypercapnic respiratory failure Due to seizure activity and DKA - resolved with patient now on room air  Aspiration pneumonia versus pneumonitis Has completed a 7-day course of empiric antibiotic therapy  Post extubation stridor with right vocal fold paralysis and large left hemorrhagic polyp of the vocal fold Status post polypectomy by ENT 5/7  Metabolic encephalopathy due to DKA and toxic encephalopathy due to cocaine Mental status slow to improve - cont to monitor   Seizure with postictal state Incited by DKA - continue Keppra   Acute kidney injury on CKD stage IV Required HD during initial portion of hospital stay -nephrology following -baseline creatinine approximately 2.6 - HD cath to be removed 5/10 if remains stable over night   Recent Labs  Lab 10/01/23 0549 10/01/23 1129 10/02/23 0525 10/03/23 0500 10/04/23 0442  CREATININE 2.20* 2.27* 2.33* 2.32*  2.36* 2.41*     Uncontrolled DM2 -DKA with coma A1c 14 - DKA resolved - CBG above goal - adjust tx and follow trend   Normocytic anemia Likely simple anemia of chronic kidney disease -follow trend  Paroxysmal atrial fibrillation NSR at present   Uncontrolled HTN Adjust medical therapy and follow  HLD  Chronic diastolic CHF  Cocaine abuse UDS positive for cocaine at admission -patient has been counseled that she absolutely must discontinue cocaine use permanently  Family Communication: No family present at time of exam Disposition:  CIR v/s SNF for rehab stay    Objective: Blood pressure (!) 192/90, pulse 63, temperature (!) 97.4 F (36.3 C), temperature source Oral, resp. rate  18, height 5\' 5"  (1.651 m), weight 61 kg, SpO2 97%.  Intake/Output Summary (Last 24 hours) at 10/04/2023 0853 Last data filed at 10/04/2023 0300 Gross per 24 hour  Intake --  Output 250 ml  Net -250 ml   Filed Weights   10/02/23 0753 10/03/23 0500 10/04/23 0412  Weight: 66.6 kg 66.5 kg 61 kg    Examination: General: No acute  respiratory distress Lungs: Clear to auscultation bilaterally without wheezes or crackles Cardiovascular: Regular rate and rhythm without murmur gallop or rub normal S1 and S2 Abdomen: Nontender, nondistended, soft, bowel sounds positive, no rebound, no ascites, no appreciable mass Extremities: No significant cyanosis, clubbing, or edema bilateral lower extremities  CBC: Recent Labs  Lab 09/30/23 0911 10/01/23 1129 10/02/23 1402 10/03/23 0500 10/04/23 0442  WBC 4.8 6.0  --  12.9* 7.2  NEUTROABS 3.5 2.9  --   --   --   HGB 8.5* 10.4* 8.8* 9.3* 8.7*  HCT 26.7* 32.0* 26.0* 29.5* 27.8*  MCV 95.7 94.7  --  97.0 97.2  PLT 188 267  --  331 327   Basic Metabolic Panel: Recent Labs  Lab 09/29/23 0505 09/30/23 0631 10/01/23 0549 10/01/23 1129 10/02/23 0525 10/02/23 1402 10/03/23 0500 10/04/23 0442  NA 139 140   < > 141 141 142 141  143 143  K 4.1 3.9   < > 3.5 3.9 4.0 3.9  3.9 4.1  CL 99 106   < > 103 104  --  104  106 108  CO2 23 23   < > 24 27  --  24  25 26   GLUCOSE 318* 107*   < > 240* 225*  --  159*  162* 253*  BUN 35* 34*   < > 35* 37*  --  34*  33* 36*  CREATININE 2.34* 2.23*   < > 2.27* 2.33*  --  2.32*  2.36* 2.41*  CALCIUM  8.1* 8.0*   < > 8.2* 8.5*  --  8.6*  8.6* 8.2*  MG 2.2 2.1  --  2.2  --   --   --   --   PHOS 4.2 4.3  --   --  2.4*  --  3.1 3.3   < > = values in this interval not displayed.   GFR: Estimated Creatinine Clearance: 20.1 mL/min (A) (by C-G formula based on SCr of 2.41 mg/dL (H)).   Scheduled Meds:  atorvastatin   80 mg Oral Daily   Chlorhexidine  Gluconate Cloth  6 each Topical Daily   docusate sodium   100 mg Oral BID   heparin   5,000 Units Subcutaneous Q8H   insulin  aspart  0-15 Units Subcutaneous TID WC   insulin  glargine-yfgn  5 Units Subcutaneous Daily   levETIRAcetam   500 mg Oral BID   metoprolol  tartrate  25 mg Oral BID   NIFEdipine  30 mg Oral Daily   mouth rinse  15 mL Mouth Rinse 4 times per day   pantoprazole  (PROTONIX ) IV   40 mg Intravenous Daily   polyethylene glycol  17 g Oral Daily   sodium chloride  flush  10-40 mL Intracatheter Q12H   thiamine   100 mg Oral Daily   torsemide   20 mg Oral BID   Continuous Infusions:  anticoagulant sodium citrate        LOS: 10 days   Abbe Abate, MD [facility] Office  [phone] Pager - Text Page per Tilford Foley  If 7PM-7AM, please contact night-coverage per Amion 10/04/2023, 8:53 AM

## 2023-10-04 NOTE — Progress Notes (Signed)
 Inpatient Rehab Admissions Coordinator:     I met with pt. To discuss potential CIR but she was confused and not able to answer most of my questions. Rehab MD consulted and felt Pt. Will need 24/7 physical assist at home. I called Pt.'s daughter to discuss and left a VM to discuss.   Wandalee Gust, MS, CCC-SLP Rehab Admissions Coordinator  [phone] (celll) [phone] (office)

## 2023-10-04 NOTE — Plan of Care (Signed)

## 2023-10-05 DIAGNOSIS — R569 Unspecified convulsions: Secondary | ICD-10-CM | POA: Diagnosis not present

## 2023-10-05 LAB — GLUCOSE, CAPILLARY
Glucose-Capillary: 355 mg/dL — ABNORMAL HIGH (ref 70–99)
Glucose-Capillary: 292 mg/dL — ABNORMAL HIGH (ref 70–99)
Glucose-Capillary: 79 mg/dL (ref 70–99)
Glucose-Capillary: 144 mg/dL — ABNORMAL HIGH (ref 70–99)

## 2023-10-05 LAB — RENAL FUNCTION PANEL
Albumin: 2.2 g/dL — ABNORMAL LOW (ref 3.5–5.0)
Anion gap: 11 (ref 5–15)
BUN: 38 mg/dL — ABNORMAL HIGH (ref 8–23)
CO2: 27 mmol/L (ref 22–32)
Calcium: 8.5 mg/dL — ABNORMAL LOW (ref 8.9–10.3)
Chloride: 107 mmol/L (ref 98–111)
Creatinine, Ser: 2.4 mg/dL — ABNORMAL HIGH (ref 0.44–1.00)
GFR, Estimated: 21 mL/min — ABNORMAL LOW (ref >60)
Glucose, Bld: 319 mg/dL — ABNORMAL HIGH (ref 70–99)
Phosphorus: 3.7 mg/dL (ref 2.5–4.6)
Potassium: 4.1 mmol/L (ref 3.5–5.1)
Sodium: 145 mmol/L (ref 135–145)

## 2023-10-05 LAB — CBC
HCT: 26.6 % — ABNORMAL LOW (ref 36.0–46.0)
Hemoglobin: 8.3 g/dL — ABNORMAL LOW (ref 12.0–15.0)
MCH: 31 pg (ref 26.0–34.0)
MCHC: 31.2 g/dL (ref 30.0–36.0)
MCV: 99.3 fL (ref 80.0–100.0)
Platelets: 306 10*3/uL (ref 150–400)
RBC: 2.68 MIL/uL — ABNORMAL LOW (ref 3.87–5.11)
RDW: 12.7 % (ref 11.5–15.5)
WBC: 7.3 10*3/uL (ref 4.0–10.5)
nRBC: 0 % (ref 0.0–0.2)

## 2023-10-05 MED ORDER — INSULIN GLARGINE-YFGN 100 UNIT/ML ~~LOC~~ SOLN
20.0000 [IU] | Freq: Every day | SUBCUTANEOUS | Status: DC
  Administered 2023-10-05 – 2023-10-09 (×5): 20 [IU] via SUBCUTANEOUS
  Filled 2023-10-05 (×5): qty 0.2

## 2023-10-05 MED ORDER — INSULIN ASPART 100 UNIT/ML IJ SOLN
10.0000 [IU] | Freq: Three times a day (TID) | INTRAMUSCULAR | Status: DC
  Administered 2023-10-05 – 2023-10-06 (×2): 10 [IU] via SUBCUTANEOUS

## 2023-10-05 MED ORDER — INSULIN ASPART 100 UNIT/ML IJ SOLN: 8.0000 [IU] | Freq: Three times a day (TID) | INTRAMUSCULAR | Status: DC

## 2023-10-05 MED ORDER — METOPROLOL TARTRATE 50 MG PO TABS
100.0000 mg | ORAL_TABLET | Freq: Two times a day (BID) | ORAL | Status: DC
  Administered 2023-10-05 – 2023-10-07 (×6): 100 mg via ORAL
  Filled 2023-10-05 (×6): qty 2

## 2023-10-05 MED ORDER — NIFEDIPINE ER OSMOTIC RELEASE 60 MG PO TB24
90.0000 mg | ORAL_TABLET | Freq: Every day | ORAL | Status: DC
  Administered 2023-10-05 – 2023-10-07 (×3): 90 mg via ORAL
  Filled 2023-10-05 (×4): qty 1

## 2023-10-05 MED ORDER — INSULIN ASPART 100 UNIT/ML IJ SOLN
5.0000 [IU] | Freq: Once | INTRAMUSCULAR | Status: AC
  Administered 2023-10-05: 5 [IU] via SUBCUTANEOUS

## 2023-10-05 NOTE — Progress Notes (Addendum)
 Patient ID: Lori Gates, female   DOB: Jun 02, 1955, 68 y.o.   MRN: [id number] [facility] Progress Note   Assessment/ Plan:   1. Acute kidney Injury on chronic kidney disease stage IV: Baseline creatinine ranging 2.5-2.8.  She continues to maintain stable labs/renal function and has not had any indication for dialysis over the past week.  Urine output appears inadequately charted on torsemide  20 mg twice daily.  I will order for removal of her dialysis catheter today.  She previously used to be followed by the nephrology group in [place] and recently moved to the [place] area.  I will arrange for follow-up at our [place] clinic in 3 to 4 weeks (although this may need to be timed based on disposition with possible admission to CIR). 2.  Diabetic ketoacidosis: Initially with coma/altered mental status that has improved with insulin  therapy/fluids.  Glycemia management per primary service. 3.  Acute metabolic encephalopathy: Appears to be bifactorial from diabetic ketoacidosis and cocaine abuse.  Mental status appears to have improved status post management of DKA.  She is on levetiracetam  with recent seizures.  Ongoing evaluation for CIR admission. 4.  Hypertension: Elevated blood pressure noted, started on metoprolol  today in addition to torsemide .  Start nifedipine ER.  Nephrology service will sign off at this time and the patient/family contact will be contacted with details of outpatient nephrology appointment (likely at our [facility] location).   Subjective:   Without acute events overnight, no acute complaints when seen this morning.   Objective:   BP (!) 170/88   Pulse 64   Temp 98.2 F (36.8 C) (Oral)   Resp 18   Ht 5\' 5"  (1.651 m)   Wt 61 kg   SpO2 99%   BMI 22.38 kg/m   Intake/Output Summary (Last 24 hours) at 10/05/2023 0818 Last data filed at 10/05/2023 0600 Gross per 24 hour  Intake 800 ml  Output 800 ml  Net 0 ml   Weight change:   Physical  Exam: Gen: Resting comfortably in bed, awakens easily to voice CVS: Pulse regular rhythm, normal rate, S1 and S2 normal Resp: Clear to auscultation bilaterally, no rales/rhonchi.  Temporary right IJ dialysis catheter in place Abd: Soft, obese, nontender, bowel sounds normal Ext: Trace bilateral ankle edema with trace upper extremity edema.  Left BBF with palpable thrill  Imaging: DG Swallowing Func-Speech Pathology Result Date: 10/03/2023 Table formatting from the original result was not included. Modified Barium Swallow Study Patient Details Name: JERE BRENNICK MRN: [id number] Date of Birth: 1955/12/21 Today's Date: 10/03/2023 HPI/PMH: HPI: Erianna Spangenberg is a 68 year old female who presented to ED after daughter found on floor actively seizing, CBG read "high". Continued to seize from time of EMS dispatch to arrival to ER, and was intubated for airway protection.  ETT 4/29-5/4.  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 Pt with poorly controlled IDDM, CKD stage IV, HTN, LLD, HFpEF, substance abuse (cocaine) followed by nephrology in [place]. Clinical Impression: Pt presents with a mild oropharyngeal dysphagia c/b delayed swallow initiation, decreased base of tongue retraction, reduced laryngeal elevation, incomplete laryngeal closure and diminished sensation.  Pt  has known immobile R VF per ENT asssessment.  These deficits resulted in silent aspiration of thin liquid at posterior trachea during the swallow.  Pt was uanble to follow instructions for cued cough to clear aspiraiton, and suspect she would not reliably execute compensatory strategies at  this time if any were effective.  Contrast progessed down posterior tracheal wall and then remained static in upper trachea for the majority of the duration of the study  There transient penetration of nectar thick liquid.  There was no penetration of puree, honey, or regular solid. Pt exhibited prolonged oral phase with solid 2/2 edentulism, but acheived adequate oral clearance.  During pill simulation pt was unable to transit tablet orally with puree.  Recommend crushing medications.   Pt appears safe to initiate a modified oral diet.  Recommend mechanical soft solids with nectar thick liquid. DIGEST Swallow Severity Rating*  Safety: 1  Efficiency: 1  Overall Pharyngeal Swallow Severity: 1 1: mild; 2: moderate; 3: severe; 4: profound *The Dynamic Imaging Grade of Swallowing Toxicity is standardized for the head and neck cancer population, however, demonstrates promising clinical applications across populations to standardize the clinical rating of pharyngeal swallow safety and severity. Factors that may increase risk of adverse event in presence of aspiration Roderick Civatte & Jessy Morocco 2021): Factors that may increase risk of adverse event in presence of aspiration Roderick Civatte & Jessy Morocco 2021): Reduced cognitive function Recommendations/Plan: Swallowing Evaluation Recommendations Swallowing Evaluation Recommendations Recommendations: PO diet PO Diet Recommendation: Dysphagia 3 (Mechanical soft); Mildly thick liquids (Level 2, nectar thick) Liquid Administration via: Cup Medication Administration: Crushed with puree Supervision: Full assist for feeding Swallowing strategies  : Slow rate; Small bites/sips Postural changes: Position pt fully upright for meals Oral care recommendations: Oral care BID (2x/day) Caregiver Recommendations: Avoid jello, ice cream, thin soups, popsicles Treatment Plan Treatment Plan Treatment recommendations: Therapy as outlined in treatment plan below Follow-up recommendations: -- (ST at next level of  care) Functional status assessment: Patient has had a recent decline in their functional status and demonstrates the ability to make significant improvements in function in a reasonable and predictable amount of time. Treatment frequency: Min 2x/week Treatment duration: 2 weeks Interventions: Diet toleration management by SLP; Patient/family education (Exercises if pt becomes able to participate) Recommendations Recommendations for follow up therapy are one component of a multi-disciplinary discharge planning process, led by the attending physician.  Recommendations may be updated based on patient status, additional functional criteria and insurance authorization. Assessment: Orofacial Exam: Orofacial Exam Oral Cavity: Oral Hygiene: WFL Oral Cavity - Dentition: Edentulous; Dentures, not available Orofacial Anatomy: WFL Oral Motor/Sensory Function: -- (See BSE) Anatomy: Anatomy: Suspected cervical osteophytes (without impact on sw fn) Boluses Administered: Boluses Administered Boluses Administered: Thin liquids (Level 0); Mildly thick liquids (Level 2, nectar thick); Moderately thick liquids (Level 3, honey thick); Puree; Solid  Oral Impairment Domain: Oral Impairment Domain Lip Closure: Interlabial escape, no progression to anterior lip Tongue control during bolus hold: Cohesive bolus between tongue to palatal seal Bolus preparation/mastication: Slow prolonged chewing/mashing with complete recollection Bolus transport/lingual motion: Brisk tongue motion Oral residue: Trace residue lining oral structures Location of oral residue : Tongue Initiation of pharyngeal swallow : Pyriform sinuses  Pharyngeal Impairment Domain: Pharyngeal Impairment Domain Soft palate elevation: No bolus between soft palate (SP)/pharyngeal wall (PW) Laryngeal elevation: Partial superior movement of thyroid cartilage/partial approximation of arytenoids to epiglottic petiole Anterior hyoid excursion: Complete anterior movement Epiglottic  movement: Complete inversion Laryngeal vestibule closure: Incomplete, narrow column air/contrast in laryngeal vestibule  Pharyngeal stripping wave : Present - diminished Pharyngeal contraction (A/P view only): N/A Pharyngoesophageal segment opening: Complete distension and complete duration, no obstruction of flow Tongue base retraction: Trace column of contrast or air between tongue base and PPW Pharyngeal residue: Trace residue within or on pharyngeal structures Location of pharyngeal residue: Valleculae  Esophageal Impairment Domain: Esophageal Impairment Domain Esophageal clearance upright position: -- (Not assessed) Pill: Pill Consistency administered: Puree Puree: Impaired (see clinical impressions) (Unable to transit orally) Penetration/Aspiration Scale Score: Penetration/Aspiration Scale Score 1.  Material does not enter airway: Moderately thick liquids (Level 3, honey thick); Puree; Solid 2.  Material enters airway, remains ABOVE vocal cords then ejected out: Mildly thick liquids (Level 2, nectar thick) 8.  Material enters airway, passes BELOW cords without attempt by patient to eject out (silent aspiration) : Thin liquids (Level 0) Compensatory Strategies: Compensatory Strategies Compensatory strategies: No   General Information: Caregiver present: No  Diet Prior to this Study: NPO   No data recorded  No data recorded  Supplemental O2: Nasal cannula   History of Recent Intubation: Yes  Behavior/Cognition: Alert; Cooperative Self-Feeding Abilities: Needs assist with self-feeding Baseline vocal quality/speech: Aphonic; Dysphonic Volitional Cough: Unable to elicit No data recorded Exam Limitations: -- (Unable to follow directions for compensatory strategies) Goal Planning: Prognosis for improved oropharyngeal function: Guarded No data recorded No data recorded No data recorded Consulted and agree with results and recommendations: Patient; Nurse Pain: Pain Assessment Breathing: 0 Negative Vocalization: 0 Facial  Expression: 0 Body Language: 0 Consolability: 0 PAINAD Score: 0 End of Session: Start Time:SLP Start Time (ACUTE ONLY): 1009 Stop Time: SLP Stop Time (ACUTE ONLY): 1021 Time Calculation:SLP Time Calculation (min) (ACUTE ONLY): 12 min Charges: SLP Evaluations $ SLP Speech Visit: 1 Visit SLP Evaluations $MBS Swallow: 1 Procedure $Swallowing Treatment: 1 Procedure SLP visit diagnosis: SLP Visit Diagnosis: Dysphagia, oropharyngeal phase (R13.12) Past Medical History: Past Medical History: Diagnosis Date  Asthma   Diabetes mellitus without complication (HCC)   Hypertension  Past Surgical History: Past Surgical History: Procedure Laterality Date  MICROLARYNGOSCOPY WITH LASER Left 10/02/2023  Procedure: DIRECT MICROLARYNGOSCOPY, BRONCHOSCOPY, CO2 LASER EXCISION OF LEFT VOCAL FOLD POLYP/MASS;  Surgeon: Artice Last, MD;  Location: [facility] OR;  Service: ENT;  Laterality: Left;  RIGID BRONCHOSCOPY N/A 10/02/2023  Procedure: BRONCHOSCOPY, RIGID;  Surgeon: Artice Last, MD;  Location: [facility] OR;  Service: ENT;  Laterality: N/A;  TUBAL LIGATION   Elester Grim, MA, CCC-SLP Acute Rehabilitation Services Office: [phone] 10/03/2023, 11:12 AM   Labs: BMET Recent Labs  Lab 09/29/23 0505 09/30/23 0631 10/01/23 0549 10/01/23 1129 10/02/23 0525 10/02/23 1402 10/03/23 0500 10/04/23 0442 10/05/23 0559  NA 139 140 140 141 141 142 141  143 143 145  K 4.1 3.9 4.0 3.5 3.9 4.0 3.9  3.9 4.1 4.1  CL 99 106 107 103 104  --  104  106 108 107  CO2 23 23 24 24 27   --  24  25 26 27   GLUCOSE 318* 107* 268* 240* 225*  --  159*  162* 253* 319*  BUN 35* 34* 37* 35* 37*  --  34*  33* 36* 38*  CREATININE 2.34* 2.23* 2.20* 2.27* 2.33*  --  2.32*  2.36* 2.41* 2.40*  CALCIUM  8.1* 8.0* 8.2* 8.2* 8.5*  --  8.6*  8.6* 8.2* 8.5*  PHOS 4.2 4.3  --   --  2.4*  --  3.1 3.3 3.7   CBC Recent Labs  Lab 09/30/23 0911 10/01/23 1129 10/02/23 1402 10/03/23  0500 10/04/23 0442 10/05/23 0559  WBC 4.8 6.0  --  12.9* 7.2 7.3  NEUTROABS  3.5 2.9  --   --   --   --   HGB 8.5* 10.4* 8.8* 9.3* 8.7* 8.3*  HCT 26.7* 32.0* 26.0* 29.5* 27.8* 26.6*  MCV 95.7 94.7  --  97.0 97.2 99.3  PLT 188 267  --  331 327 306    Medications:     atorvastatin   80 mg Oral Daily   Chlorhexidine  Gluconate Cloth  6 each Topical Daily   docusate sodium   100 mg Oral BID   heparin   5,000 Units Subcutaneous Q8H   insulin  aspart  0-20 Units Subcutaneous TID WC   insulin  aspart  0-5 Units Subcutaneous QHS   insulin  glargine-yfgn  10 Units Subcutaneous Daily   levETIRAcetam   500 mg Oral BID   metoprolol  tartrate  50 mg Oral BID   NIFEdipine  60 mg Oral Daily   mouth rinse  15 mL Mouth Rinse 4 times per day   pantoprazole   40 mg Oral Daily   polyethylene glycol  17 g Oral Daily   sodium chloride  flush  10-40 mL Intracatheter Q12H   thiamine   100 mg Oral Daily   torsemide   20 mg Oral BID    Clevester Dally, MD 10/05/2023, 8:18 AM

## 2023-10-05 NOTE — Progress Notes (Signed)
 Lori Gates  WUJ:[id number] DOB: Jun 13, 1955 DOA: 09/24/2023 PCP: Default, Provider, MD    Brief Narrative:  68 year old with uncontrolled DM2, CKD stage IV, HTN, diastolic CHF, and cocaine abuse who was admitted to the hospital 4/29 after she was found down on the floor unresponsive and seizing and ultimately diagnosed with DKA with seizure activity.  She required endotracheal intubation upon arrival to the ER, and was admitted to the ICU.  CT head was negative for acute bleeding.  MRI brain was negative for acute process.  After correction of her DKA she was able to be extubated but suffered with stridor postextubation.  ENT was consulted and the patient was found to have a left vocal cord polyp.  She was taken to the OR by ENT with excision of the polyp and required approximately 12 hours of rest on the ventilator postoperatively before she could be reextubated.  Significant Events: 4/29 found unconscious and seizing on floor -intubated in ER -admit to ICU in DKA 5/1 EEG negative for ongoing seizures -temporary HD cath placed 5/2 HD performed 5/4 extubated -postextubation developed stridor 5/6 ongoing stridor without respiratory distress -ENT performed fiberoptic laryngoscopy showing right vocal fold paralysis and large hemorrhagic polyp on the left vocal fold  5/7 2 OR for left vocal fold polyp excision -remained intubated postop due to low tidal volumes requiring additional night in ICU 5/9 transferred to [facility]  Goals of Care:   Code Status: Full Code   DVT prophylaxis: heparin  injection 5,000 Units Start: 09/25/23 0600   Interim Hx: No acute events recorded overnight.  Afebrile.  Blood pressure remains elevated with systolics 159-174.  Vitals are otherwise stable.  Alert but confused today.  Is unable to provide a reliable history.  Does not appear to be uncomfortable.  No acute distress appreciable.   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 Status post polypectomy by ENT 5/7 - ENT to consider repeat flexible scope exam to re-evaluate her right vocal cord - if her vocal cord remains inoperable she will need a contrasted CT of the neck and chest to evaluate her recurrent laryngeal nerve -presently it is not felt wise to administer IV contrast while renal function is recovering  Metabolic encephalopathy due to DKA and toxic encephalopathy due to cocaine Mental status slow to improve -TSH elevated but free T4 normal -check B12 folic acid and ammonia levels  Seizure with postictal state Incited by DKA - continue Keppra   Acute kidney injury on CKD stage IV Required HD during initial portion of hospital stay -nephrology following -baseline creatinine approximately 2.6 - HD cath removed today  Recent Labs  Lab 10/01/23 1129 10/02/23 0525 10/03/23 0500 10/04/23 0442 10/05/23 0559  CREATININE 2.27* 2.33* 2.32*  2.36* 2.41* 2.40*    Normocytic anemia Likely simple anemia of chronic kidney disease -follow trend  Paroxysmal atrial fibrillation NSR at present   Uncontrolled HTN Blood pressure not yet at goal - adjust medical therapy again today and follow  HLD Continue Lipitor   Chronic diastolic CHF Appears euvolemic at present  Cocaine  abuse UDS positive for cocaine at admission -patient has been counseled that she absolutely must discontinue cocaine use permanently  Family Communication: No family present at time of exam Disposition:  CIR v/s SNF for rehab stay    Objective: Blood pressure (!) 170/88,  pulse 64, temperature 98.2 F (36.8 C), temperature source Oral, resp. rate 18, height 5\' 5"  (1.651 m), weight 61 kg, SpO2 99%.  Intake/Output Summary (Last 24 hours) at 10/05/2023 0836 Last data filed at 10/05/2023 0600 Gross per 24 hour  Intake 800 ml  Output 800 ml  Net 0 ml   Filed Weights   10/02/23 0753 10/03/23 0500 10/04/23 0412  Weight: 66.6 kg 66.5 kg 61 kg    Examination: General: No acute respiratory distress -confused but alert Lungs: Clear to auscultation bilaterally without wheezes or crackles Cardiovascular: Regular rate and rhythm without murmur gallop or rub normal S1 and S2 Abdomen: Nontender, nondistended, soft, bowel sounds positive, no rebound Extremities: No significant edema bilateral lower extremities  CBC: Recent Labs  Lab 09/30/23 0911 10/01/23 1129 10/02/23 1402 10/03/23 0500 10/04/23 0442 10/05/23 0559  WBC 4.8 6.0  --  12.9* 7.2 7.3  NEUTROABS 3.5 2.9  --   --   --   --   HGB 8.5* 10.4*   < > 9.3* 8.7* 8.3*  HCT 26.7* 32.0*   < > 29.5* 27.8* 26.6*  MCV 95.7 94.7  --  97.0 97.2 99.3  PLT 188 267  --  331 327 306   < > = values in this interval not displayed.   Basic Metabolic Panel: Recent Labs  Lab 09/29/23 0505 09/30/23 0631 10/01/23 0549 10/01/23 1129 10/02/23 0525 10/03/23 0500 10/04/23 0442 10/05/23 0559  NA 139 140   < > 141   < > 141  143 143 145  K 4.1 3.9   < > 3.5   < > 3.9  3.9 4.1 4.1  CL 99 106   < > 103   < > 104  106 108 107  CO2 23 23   < > 24   < > 24  25 26 27   GLUCOSE 318* 107*   < > 240*   < > 159*  162* 253* 319*  BUN 35* 34*   < > 35*   < > 34*  33* 36* 38*  CREATININE 2.34* 2.23*   < > 2.27*   < > 2.32*  2.36* 2.41* 2.40*  CALCIUM  8.1* 8.0*   < > 8.2*   < > 8.6*  8.6* 8.2* 8.5*  MG 2.2 2.1  --  2.2  --   --   --   --   PHOS 4.2 4.3  --   --    < > 3.1 3.3 3.7   < > = values in this interval not displayed.   GFR: Estimated Creatinine Clearance: 20.2 mL/min (A) (by C-G formula based on SCr of 2.4  mg/dL (H)).   Scheduled Meds:  atorvastatin   80 mg Oral Daily   Chlorhexidine  Gluconate Cloth  6 each Topical Daily   docusate sodium   100 mg Oral BID   heparin   5,000 Units Subcutaneous Q8H   insulin  aspart  0-20 Units Subcutaneous TID WC   insulin  aspart  0-5 Units Subcutaneous QHS   insulin  glargine-yfgn  10 Units Subcutaneous Daily   levETIRAcetam   500 mg Oral BID   metoprolol  tartrate  50 mg Oral BID   NIFEdipine  60 mg Oral Daily   mouth rinse  15 mL Mouth Rinse 4 times per day   pantoprazole   40 mg Oral Daily   polyethylene glycol  17 g Oral Daily   sodium chloride  flush  10-40 mL Intracatheter Q12H   thiamine   100 mg Oral Daily   torsemide   20 mg Oral BID      LOS: 11 days   Abbe Abate, MD [facility] Office  [phone] Pager - Text Page per Tilford Foley  If 7PM-7AM, please contact night-coverage per Amion 10/05/2023, 8:36 AM

## 2023-10-05 NOTE — Progress Notes (Signed)
 CVAD removed per protocol per MD order. Manual pressure applied for 15 mins. Vaseline gauze, gauze, and Tegaderm applied over insertion site. No bleeding or swelling noted. Instructed patient to remain in bed for thirty mins. Educated patient about S/S of infection and when to call MD; keep dressing dry and intact for 24 hours. Primary RN notified

## 2023-10-05 NOTE — Plan of Care (Signed)
 Problem: Clinical Measurements: Goal: Ability to maintain clinical measurements within normal limits will improve 10/05/2023 1916 by Kathlean Pao, RN Outcome: Progressing 10/05/2023 1915 by Kathlean Pao, RN Outcome: Not Progressing Goal: Will remain free from infection 10/05/2023 1916 by Kathlean Pao, RN Outcome: Progressing 10/05/2023 1915 by Kathlean Pao, RN Outcome: Not Progressing Goal: Diagnostic test results will improve 10/05/2023 1916 by Kathlean Pao, RN Outcome: Progressing 10/05/2023 1915 by Kathlean Pao, RN Outcome: Not Progressing Goal: Respiratory complications will improve 10/05/2023 1916 by Kathlean Pao, RN Outcome: Progressing 10/05/2023 1915 by Kathlean Pao, RN Outcome: Not Progressing Goal: Cardiovascular complication will be avoided 10/05/2023 1916 by Kathlean Pao, RN Outcome: Progressing 10/05/2023 1915 by Kathlean Pao, RN Outcome: Not Progressing   Problem: Nutrition: Goal: Adequate nutrition will be maintained 10/05/2023 1916 by Kathlean Pao, RN Outcome: Progressing 10/05/2023 1915 by Kathlean Pao, RN Outcome: Not Progressing   Problem: Coping: Goal: Level of anxiety will decrease 10/05/2023 1916 by Kathlean Pao, RN Outcome: Progressing 10/05/2023 1915 by Kathlean Pao, RN Outcome: Not Progressing   Problem: Elimination: Goal: Will not experience complications related to urinary retention 10/05/2023 1916 by Kathlean Pao, RN Outcome: Progressing 10/05/2023 1915 by Kathlean Pao, RN Outcome: Not Progressing   Problem: Pain Managment: Goal: General experience of comfort will improve and/or be controlled 10/05/2023 1916 by Kathlean Pao, RN Outcome: Progressing 10/05/2023 1915 by Kathlean Pao, RN Outcome: Not Progressing   Problem: Safety: Goal: Ability to remain free from injury will improve 10/05/2023 1916 by Kathlean Pao, RN Outcome: Progressing 10/05/2023 1915 by Kathlean Pao, RN Outcome: Not  Progressing   Problem: Skin Integrity: Goal: Risk for impaired skin integrity will decrease 10/05/2023 1916 by Kathlean Pao, RN Outcome: Progressing 10/05/2023 1915 by Kathlean Pao, RN Outcome: Not Progressing   Problem: Fluid Volume: Goal: Ability to maintain a balanced intake and output will improve 10/05/2023 1916 by Kathlean Pao, RN Outcome: Progressing 10/05/2023 1915 by Kathlean Pao, RN Outcome: Not Progressing   Problem: Metabolic: Goal: Ability to maintain appropriate glucose levels will improve 10/05/2023 1916 by Kathlean Pao, RN Outcome: Progressing 10/05/2023 1915 by Kathlean Pao, RN Outcome: Not Progressing   Problem: Nutritional: Goal: Maintenance of adequate nutrition will improve 10/05/2023 1916 by Kathlean Pao, RN Outcome: Progressing 10/05/2023 1915 by Kathlean Pao, RN Outcome: Not Progressing Goal: Progress toward achieving an optimal weight will improve 10/05/2023 1916 by Kathlean Pao, RN Outcome: Progressing 10/05/2023 1915 by Kathlean Pao, RN Outcome: Not Progressing   Problem: Skin Integrity: Goal: Risk for impaired skin integrity will decrease 10/05/2023 1916 by Kathlean Pao, RN Outcome: Progressing 10/05/2023 1915 by Kathlean Pao, RN Outcome: Not Progressing   Problem: Tissue Perfusion: Goal: Adequacy of tissue perfusion will improve 10/05/2023 1916 by Kathlean Pao, RN Outcome: Progressing 10/05/2023 1915 by Kathlean Pao, RN Outcome: Not Progressing   Problem: Respiratory: Goal: Ability to maintain a clear airway and adequate ventilation will improve 10/05/2023 1916 by Kathlean Pao, RN Outcome: Progressing 10/05/2023 1915 by Kathlean Pao, RN Outcome: Not Progressing   Problem: Role Relationship: Goal: Method of communication will improve 10/05/2023 1916 by Kathlean Pao, RN Outcome: Progressing 10/05/2023 1915 by Kathlean Pao, RN Outcome: Not Progressing   Problem: Metabolic: Goal: Ability to  maintain appropriate glucose levels will improve 10/05/2023 1916 by Kathlean Pao, RN Outcome: Progressing 10/05/2023 1915 by  Kathlean Pao, RN Outcome: Not Progressing   Problem: Nutritional: Goal: Maintenance of adequate nutrition will improve 10/05/2023 1916 by Kathlean Pao, RN Outcome: Progressing 10/05/2023 1915 by Kathlean Pao, RN Outcome: Not Progressing Goal: Maintenance of adequate weight for body size and type will improve 10/05/2023 1916 by Kathlean Pao, RN Outcome: Progressing 10/05/2023 1915 by Kathlean Pao, RN Outcome: Not Progressing   Problem: Respiratory: Goal: Will regain and/or maintain adequate ventilation 10/05/2023 1916 by Kathlean Pao, RN Outcome: Progressing 10/05/2023 1915 by Kathlean Pao, RN Outcome: Not Progressing   Problem: Education: Goal: Knowledge of General Education information will improve Description: Including pain rating scale, medication(s)/side effects and non-pharmacologic comfort measures 10/05/2023 1916 by Kathlean Pao, RN Outcome: Not Progressing 10/05/2023 1915 by Kathlean Pao, RN Outcome: Not Progressing   Problem: Health Behavior/Discharge Planning: Goal: Ability to manage health-related needs will improve 10/05/2023 1916 by Kathlean Pao, RN Outcome: Not Progressing 10/05/2023 1915 by Kathlean Pao, RN Outcome: Not Progressing   Problem: Activity: Goal: Risk for activity intolerance will decrease 10/05/2023 1916 by Kathlean Pao, RN Outcome: Not Progressing 10/05/2023 1915 by Kathlean Pao, RN Outcome: Not Progressing   Problem: Elimination: Goal: Will not experience complications related to bowel motility 10/05/2023 1916 by Kathlean Pao, RN Outcome: Not Progressing 10/05/2023 1915 by Kathlean Pao, RN Outcome: Not Progressing   Problem: Education: Goal: Ability to describe self-care measures that may prevent or decrease complications (Diabetes Survival Skills Education) will  improve 10/05/2023 1916 by Kathlean Pao, RN Outcome: Not Progressing 10/05/2023 1915 by Kathlean Pao, RN Outcome: Not Progressing Goal: Individualized Educational Video(s) 10/05/2023 1916 by Kathlean Pao, RN Outcome: Not Progressing 10/05/2023 1915 by Kathlean Pao, RN Outcome: Not Progressing   Problem: Coping: Goal: Ability to adjust to condition or change in health will improve 10/05/2023 1916 by Kathlean Pao, RN Outcome: Not Progressing 10/05/2023 1915 by Kathlean Pao, RN Outcome: Not Progressing   Problem: Health Behavior/Discharge Planning: Goal: Ability to identify and utilize available resources and services will improve 10/05/2023 1916 by Kathlean Pao, RN Outcome: Not Progressing 10/05/2023 1915 by Kathlean Pao, RN Outcome: Not Progressing Goal: Ability to manage health-related needs will improve 10/05/2023 1916 by Kathlean Pao, RN Outcome: Not Progressing 10/05/2023 1915 by Kathlean Pao, RN Outcome: Not Progressing   Problem: Activity: Goal: Ability to tolerate increased activity will improve 10/05/2023 1916 by Kathlean Pao, RN Outcome: Not Progressing 10/05/2023 1915 by Kathlean Pao, RN Outcome: Not Progressing   Problem: Education: Goal: Ability to describe self-care measures that may prevent or decrease complications (Diabetes Survival Skills Education) will improve 10/05/2023 1916 by Kathlean Pao, RN Outcome: Not Progressing 10/05/2023 1915 by Kathlean Pao, RN Outcome: Not Progressing Goal: Individualized Educational Video(s) 10/05/2023 1916 by Kathlean Pao, RN Outcome: Not Progressing 10/05/2023 1915 by Kathlean Pao, RN Outcome: Not Progressing   Problem: Cardiac: Goal: Ability to maintain an adequate cardiac output will improve 10/05/2023 1916 by Kathlean Pao, RN Outcome: Not Progressing 10/05/2023 1915 by Kathlean Pao, RN Outcome: Not Progressing   Problem: Health Behavior/Discharge Planning: Goal: Ability to  identify and utilize available resources and services will improve 10/05/2023 1916 by Kathlean Pao, RN Outcome: Not Progressing 10/05/2023 1915 by Kathlean Pao, RN Outcome: Not Progressing Goal: Ability to manage health-related needs will improve 10/05/2023 1916 by Kathlean Pao, RN Outcome: Not Progressing 10/05/2023 1915 by  Kathlean Pao, RN Outcome: Not Progressing   Problem: Fluid Volume: Goal: Ability to achieve a balanced intake and output will improve 10/05/2023 1916 by Kathlean Pao, RN Outcome: Not Progressing 10/05/2023 1915 by Kathlean Pao, RN Outcome: Not Progressing   Problem: Urinary Elimination: Goal: Ability to achieve and maintain adequate renal perfusion and functioning will improve 10/05/2023 1916 by Kathlean Pao, RN Outcome: Not Progressing 10/05/2023 1915 by Kathlean Pao, RN Outcome: Not Progressing

## 2023-10-05 NOTE — Progress Notes (Signed)
 Inpatient Rehab Admissions Coordinator:   I spoke with Pt.'s daughter regarding CIR admit. She is interested, states she wants to talk it over with family. It also appears pt.'s insurance has termed or may be out of network. I will look into this further and update family.   Wandalee Gust, MS, CCC-SLP Rehab Admissions Coordinator  [phone] (celll) [phone] (office)

## 2023-10-05 NOTE — Plan of Care (Signed)
  Problem: Education: Goal: Knowledge of General Education information will improve Description: Including pain rating scale, medication(s)/side effects and non-pharmacologic comfort measures Outcome: Progressing   Problem: Health Behavior/Discharge Planning: Goal: Ability to manage health-related needs will improve Outcome: Progressing   Problem: Clinical Measurements: Goal: Ability to maintain clinical measurements within normal limits will improve Outcome: Progressing Goal: Will remain free from infection Outcome: Progressing Goal: Diagnostic test results will improve Outcome: Progressing Goal: Respiratory complications will improve Outcome: Progressing Goal: Cardiovascular complication will be avoided Outcome: Progressing   Problem: Activity: Goal: Risk for activity intolerance will decrease Outcome: Progressing   Problem: Nutrition: Goal: Adequate nutrition will be maintained Outcome: Progressing   Problem: Coping: Goal: Level of anxiety will decrease Outcome: Progressing   Problem: Elimination: Goal: Will not experience complications related to bowel motility Outcome: Progressing Goal: Will not experience complications related to urinary retention Outcome: Progressing   Problem: Pain Managment: Goal: General experience of comfort will improve and/or be controlled Outcome: Progressing   Problem: Safety: Goal: Ability to remain free from injury will improve Outcome: Progressing   Problem: Skin Integrity: Goal: Risk for impaired skin integrity will decrease Outcome: Progressing   Problem: Education: Goal: Ability to describe self-care measures that may prevent or decrease complications (Diabetes Survival Skills Education) will improve Outcome: Progressing Goal: Individualized Educational Video(s) Outcome: Progressing   Problem: Coping: Goal: Ability to adjust to condition or change in health will improve Outcome: Progressing   Problem: Fluid  Volume: Goal: Ability to maintain a balanced intake and output will improve Outcome: Progressing   Problem: Health Behavior/Discharge Planning: Goal: Ability to identify and utilize available resources and services will improve Outcome: Progressing Goal: Ability to manage health-related needs will improve Outcome: Progressing   Problem: Metabolic: Goal: Ability to maintain appropriate glucose levels will improve Outcome: Progressing   Problem: Nutritional: Goal: Maintenance of adequate nutrition will improve Outcome: Progressing Goal: Progress toward achieving an optimal weight will improve Outcome: Progressing   Problem: Skin Integrity: Goal: Risk for impaired skin integrity will decrease Outcome: Progressing   Problem: Tissue Perfusion: Goal: Adequacy of tissue perfusion will improve Outcome: Progressing   Problem: Activity: Goal: Ability to tolerate increased activity will improve Outcome: Progressing   Problem: Respiratory: Goal: Ability to maintain a clear airway and adequate ventilation will improve Outcome: Progressing   Problem: Role Relationship: Goal: Method of communication will improve Outcome: Progressing   Problem: Education: Goal: Ability to describe self-care measures that may prevent or decrease complications (Diabetes Survival Skills Education) will improve Outcome: Progressing Goal: Individualized Educational Video(s) Outcome: Progressing   Problem: Cardiac: Goal: Ability to maintain an adequate cardiac output will improve Outcome: Progressing   Problem: Health Behavior/Discharge Planning: Goal: Ability to identify and utilize available resources and services will improve Outcome: Progressing Goal: Ability to manage health-related needs will improve Outcome: Progressing   Problem: Fluid Volume: Goal: Ability to achieve a balanced intake and output will improve Outcome: Progressing   Problem: Metabolic: Goal: Ability to maintain  appropriate glucose levels will improve Outcome: Progressing   Problem: Nutritional: Goal: Maintenance of adequate nutrition will improve Outcome: Progressing Goal: Maintenance of adequate weight for body size and type will improve Outcome: Progressing   Problem: Respiratory: Goal: Will regain and/or maintain adequate ventilation Outcome: Progressing   Problem: Urinary Elimination: Goal: Ability to achieve and maintain adequate renal perfusion and functioning will improve Outcome: Progressing

## 2023-10-06 DIAGNOSIS — R569 Unspecified convulsions: Secondary | ICD-10-CM | POA: Diagnosis not present

## 2023-10-06 LAB — GLUCOSE, CAPILLARY
Glucose-Capillary: 171 mg/dL — ABNORMAL HIGH (ref 70–99)
Glucose-Capillary: 50 mg/dL — ABNORMAL LOW (ref 70–99)
Glucose-Capillary: 56 mg/dL — ABNORMAL LOW (ref 70–99)
Glucose-Capillary: 100 mg/dL — ABNORMAL HIGH (ref 70–99)
Glucose-Capillary: 132 mg/dL — ABNORMAL HIGH (ref 70–99)

## 2023-10-06 LAB — CBC
HCT: 26.5 % — ABNORMAL LOW (ref 36.0–46.0)
Hemoglobin: 8.4 g/dL — ABNORMAL LOW (ref 12.0–15.0)
MCH: 30.9 pg (ref 26.0–34.0)
MCHC: 31.7 g/dL (ref 30.0–36.0)
MCV: 97.4 fL (ref 80.0–100.0)
Platelets: 277 10*3/uL (ref 150–400)
RBC: 2.72 MIL/uL — ABNORMAL LOW (ref 3.87–5.11)
RDW: 12.8 % (ref 11.5–15.5)
WBC: 6.2 10*3/uL (ref 4.0–10.5)
nRBC: 0 % (ref 0.0–0.2)

## 2023-10-06 LAB — IRON AND TIBC
Iron: 46 ug/dL (ref 28–170)
Saturation Ratios: 20 % (ref 10.4–31.8)
TIBC: 232 ug/dL — ABNORMAL LOW (ref 250–450)
UIBC: 186 ug/dL

## 2023-10-06 LAB — RENAL FUNCTION PANEL
Albumin: 2 g/dL — ABNORMAL LOW (ref 3.5–5.0)
Anion gap: 9 (ref 5–15)
BUN: 37 mg/dL — ABNORMAL HIGH (ref 8–23)
CO2: 25 mmol/L (ref 22–32)
Calcium: 8.1 mg/dL — ABNORMAL LOW (ref 8.9–10.3)
Chloride: 110 mmol/L (ref 98–111)
Creatinine, Ser: 2.24 mg/dL — ABNORMAL HIGH (ref 0.44–1.00)
GFR, Estimated: 23 mL/min — ABNORMAL LOW (ref >60)
Glucose, Bld: 161 mg/dL — ABNORMAL HIGH (ref 70–99)
Phosphorus: 3.8 mg/dL (ref 2.5–4.6)
Potassium: 4 mmol/L (ref 3.5–5.1)
Sodium: 144 mmol/L (ref 135–145)

## 2023-10-06 LAB — VITAMIN B12: Vitamin B-12: 759 pg/mL (ref 180–914)

## 2023-10-06 LAB — AMMONIA: Ammonia: 21 umol/L (ref 9–35)

## 2023-10-06 LAB — RETICULOCYTES
Immature Retic Fract: 13.8 % (ref 2.3–15.9)
RBC.: 2.75 MIL/uL — ABNORMAL LOW (ref 3.87–5.11)
Retic Count, Absolute: 36.9 10*3/uL (ref 19.0–186.0)
Retic Ct Pct: 1.3 % (ref 0.4–3.1)

## 2023-10-06 LAB — HIV ANTIBODY (ROUTINE TESTING W REFLEX): HIV Screen 4th Generation wRfx: NONREACTIVE

## 2023-10-06 LAB — FERRITIN: Ferritin: 96 ng/mL (ref 11–307)

## 2023-10-06 LAB — FOLATE: Folate: 5.5 ng/mL — ABNORMAL LOW (ref >5.9)

## 2023-10-06 LAB — RPR: RPR Ser Ql: NONREACTIVE

## 2023-10-06 MED ORDER — SODIUM CHLORIDE 0.9 % IV SOLN
1.0000 mg | Freq: Once | INTRAVENOUS | Status: AC
  Administered 2023-10-06: 1 mg via INTRAVENOUS
  Filled 2023-10-06: qty 0.2

## 2023-10-06 MED ORDER — FOLIC ACID 1 MG PO TABS
1.0000 mg | ORAL_TABLET | Freq: Every day | ORAL | Status: DC
  Administered 2023-10-07 – 2023-10-19 (×13): 1 mg via ORAL
  Filled 2023-10-06 (×13): qty 1

## 2023-10-06 MED ORDER — DEXTROSE 50 % IV SOLN: 1.0000 | INTRAVENOUS | Status: DC | PRN

## 2023-10-06 NOTE — PMR Pre-admission (Shared)
 PMR Admission Coordinator Pre-Admission Assessment  Patient: Lori Gates is an 68 y.o., female MRN: [id number] DOB: [birth year] Height: 5\' 5"  (165.1 cm) Weight:  (patient refused)              Insurance Information HMO: yes    PPO:      PCP:      IPA:      80/20:      OTHER:  PRIMARYElihu Gates Medicare HMO      Policy#: [id number], Medicare: [id number]      Subscriber: pt CM Name: lorraine       Phone#: [phone]     Fax#: [phone] Pre-Cert#: [id number]      Employer:  Benefits:  Phone #:      Name:  Pt. Approved on 5/13 for 7 days, with 5 days to admit.  Eff Date: 09/26/2023 - 05/27/2024   Deductible: $300 ($300 met)   OOP Max: $9,350 ($300 met)   CIR: $399/day co-pay with a max co-pay of $2,394/admission (6 days)   SNF: $0/day co-pay for days 1-20, $214/day co-pay for days 21-100, limited to 100 days/cal yr   Outpatient:  $25 copay/visit Home Health:  100% coverage   DME: 88% coverage; 12% co-insurance   Providers: in network  SECONDARY:       Policy#:       Phone#:    [phone].  Clinicals uploaded at 11:14am.  Thanks!       Financial Counselor:       Phone#:   The Data processing manager" for patients in Inpatient Rehabilitation Facilities with attached "Privacy Act Statement-Health Care Records" was provided and verbally reviewed with: Pt  Emergency Contact Information Contact Information     Name Relation Home Work Mobile   [place]   [phone]   Brodnax,Melissa Daughter   [phone]      Other Contacts     Name Relation Home Work Mobile   Abbott,Norma Niece [phone]   Danne Dustman   [phone]      Current Medical History  Patient Admitting Diagnosis: DKA, CVA History of Present Illness:  Lori Gates is a 68 y.o. female with a history of diabetes, asthma, hypertension who was found down and seizing on 09/24/2023. She was brought to the [facility] ED on 09/24/23.  Her blood sugar was greater than 700.  Patient was emergently intubated.  UDS was positive for cocaine and benzodiazepines.  Patient was admitted for DKA, seizures and urosepsis.  Hemodialysis was initiated on Sep 26, 2023.  MRI of the brain on revealed punctate foci of acute/early subacute infarcts involving the subcortical white matter of the left postcentral gyrus.  On May 7 she had excision of the left true vocal cord polyp.  Patient continues to be followed by nephrology for acute on chronic kidney disease.  Patient is on Keppra  for recent seizures.  Mental status has shown some improvement.  Patient was up with therapy yesterday and was mod assist for sit to stand and stand pivot transfers.  She has not walked yet but did stand.  Knees were buckling and required blocking.  From a standpoint of her ADLs she has been near the mod assist level for basic activities.  Patient lives at home with her children.  Patient was modified independent using a rolling walker prior to admission.  She had a sedentary lifestyle.  Patient was independent for her basic activities of living. Pt. Seen by PT/OT and they recommend  CIR to assist return to PLOF. Complete NIHSS TOTAL: 18 Glasgow Coma Scale Score: 14  Patient's medical record from [facility]  has been reviewed by the rehabilitation admission coordinator and physician.  Past Medical History  Past Medical History:  Diagnosis Date   Asthma    Diabetes mellitus without complication (HCC)    Hypertension     Has the patient had major surgery during 100 days prior to admission? No  Family History  family history is not on file.   Current Medications   Current Facility-Administered Medications:    acetaminophen  (TYLENOL ) tablet 650 mg, 650 mg, Oral, Q4H PRN, Lori Gates, RPH   atorvastatin  (LIPITOR ) tablet 80 mg, 80 mg, Oral, Daily, Lori Gates, RPH, 80 mg at 10/06/23 9562   Chlorhexidine  Gluconate Cloth 2 %  PADS 6 each, 6 each, Topical, Daily, Desai, Nikita S, MD, 6 each at 10/06/23 0100   docusate sodium  (COLACE) capsule 100 mg, 100 mg, Oral, BID, Lori Gates, RPH, 100 mg at 10/05/23 2152   docusate sodium  (COLACE) capsule 100 mg, 100 mg, Oral, BID PRN, Lori Gates, RPH   heparin  injection 5,000 Units, 5,000 Units, Subcutaneous, Q8H, Payne, John D, PA-C, 5,000 Units at 10/06/23 0532   insulin  aspart (novoLOG ) injection 0-20 Units, 0-20 Units, Subcutaneous, TID WC, Abbe Abate, MD, 4 Units at 10/06/23 0820   insulin  aspart (novoLOG ) injection 0-5 Units, 0-5 Units, Subcutaneous, QHS, Abbe Abate, MD, 3 Units at 10/04/23 2121   insulin  aspart (novoLOG ) injection 10 Units, 10 Units, Subcutaneous, TID WC, Abbe Abate, MD, 10 Units at 10/06/23 0820   insulin  glargine-yfgn (SEMGLEE ) injection 20 Units, 20 Units, Subcutaneous, Daily, Abbe Abate, MD, 20 Units at 10/06/23 0915   levalbuterol  (XOPENEX ) nebulizer solution 0.63 mg, 0.63 mg, Nebulization, Q6H PRN, Abbe Abate, MD   levETIRAcetam  (KEPPRA ) tablet 500 mg, 500 mg, Oral, BID, 500 mg at 10/06/23 0914 **OR** [DISCONTINUED] levETIRAcetam  (KEPPRA ) IVPB 500 mg/100 mL premix, 500 mg, Intravenous, BID, Trevor Fudge, MD   LORazepam  (ATIVAN ) 2 MG/ML concentrated solution 0.5 mg, 0.5 mg, Oral, Q4H PRN, Patel, Pranav M, MD   metoprolol  tartrate (LOPRESSOR ) tablet 100 mg, 100 mg, Oral, BID, Abbe Abate, MD, 100 mg at 10/06/23 0914   NIFEdipine (PROCARDIA-XL/NIFEDICAL-XL) 24 hr tablet 90 mg, 90 mg, Oral, Daily, Abbe Abate, MD, 90 mg at 10/06/23 1308   Oral care mouth rinse, 15 mL, Mouth Rinse, 4 times per day, Trevor Fudge, MD, 15 mL at 10/06/23 0916   Oral care mouth rinse, 15 mL, Mouth Rinse, PRN, Chand, Sudham, MD   pantoprazole  (PROTONIX ) EC tablet 40 mg, 40 mg, Oral, Daily, Abbe Abate, MD, 40 mg at 10/06/23 0914   polyethylene glycol (MIRALAX  / GLYCOLAX ) packet 17 g, 17 g, Oral, Daily, Lori Gates, RPH, 17 g at 10/05/23 1121   polyethylene glycol (MIRALAX  / GLYCOLAX ) packet 17 g, 17 g, Oral, Daily PRN, Lori Gates, [facility]   sodium chloride  flush (NS) 0.9 % injection 10-40 mL, 10-40 mL, Intracatheter, Q12H, Patrick Boor, MD, 10 mL at 10/06/23 6578   sodium chloride  flush (NS) 0.9 % injection 10-40 mL, 10-40 mL, Intracatheter, PRN, Patrick Boor, MD   thiamine  (VITAMIN B1) tablet 100 mg, 100 mg, Oral, Daily, Lori Gates, RPH, 100 mg at 10/06/23 4696   torsemide  (DEMADEX ) tablet 20 mg, 20 mg, Oral, BID, Lori Gates, RPH, 20 mg at 10/06/23 2952  Patients Current Diet:  Diet Order  DIET DYS 3 Fluid consistency: Nectar Thick  Diet effective now                   Precautions / Restrictions Precautions Precautions: Fall Precaution/Restrictions Comments: bilateral mittens Restrictions Weight Bearing Restrictions Per Provider Order: No   Has the patient had 2 or more falls or a fall with injury in the past year?No  Prior Activity Level Community (5-7x/wk): Pt. was activein the community PTA  Prior Functional Level Prior Function Prior Level of Function : Independent/Modified Independent Mobility Comments: uses RW, 2 other falls. sedentary lifestyle. ADLs Comments: independent with ADLs, daughter does IADLs  Self Care: Did the patient need help bathing, dressing, using the toilet or eating?  Independent  Indoor Mobility: Did the patient need assistance with walking from room to room (with or without device)? Independent  Stairs: Did the patient need assistance with internal or external stairs (with or without device)? Independent  Functional Cognition: Did the patient need help planning regular tasks such as shopping or remembering to take medications? Independent  Patient Information Are you of Hispanic, Latino/a,or Spanish origin?: A. No, not of Hispanic, Latino/a, or Spanish origin (proxy) What is your race?: B. Black or African American Do you need  or want an interpreter to communicate with a doctor or health care staff?: 0. No  Patient's Response To:  Health Literacy and Transportation Is the patient able to respond to health literacy and transportation needs?: No Health Literacy - How often do you need to have someone help you when you read instructions, pamphlets, or other written material from your doctor or pharmacy?: Patient unable to respond In the past 12 months, has lack of transportation kept you from medical appointments or from getting medications?: No (proxy) In the past 12 months, has lack of transportation kept you from meetings, work, or from getting things needed for daily living?: No  Home Assistive Devices / Equipment Home Equipment: Agricultural consultant (2 wheels)  Prior Device Use: Indicate devices/aids used by the patient prior to current illness, exacerbation or injury? None of the above  Current Functional Level Cognition  Orientation Level: Oriented to person, Disoriented to place, Oriented to situation, Oriented to time, Oriented to place    Extremity Assessment (includes Sensation/Coordination)  Upper Extremity Assessment: RUE deficits/detail, LUE deficits/detail RUE Deficits / Details: using functionally however generally weak; gross graps release to hold cloth; able to touch hand to forehead poor in-hand manipulation skills LUE Deficits / Details: edematous; weaker thanRUE; gross grasp/release; absent in-hand manipulation skills' able to maintain grasp on cloth; only abl eot complete hand to mouth  Lower Extremity Assessment: Defer to PT evaluation    ADLs  Overall ADL's : Needs assistance/impaired Eating/Feeding: Moderate assistance Eating/Feeding Details (indicate cue type and reason): nectar thick Grooming: Moderate assistance Upper Body Bathing: Moderate assistance Lower Body Bathing: Maximal assistance, Sit to/from stand Upper Body Dressing : Maximal assistance, Sitting Lower Body Dressing: Total  assistance Toilet Transfer: Maximal assistance, Stand-pivot Toileting- Clothing Manipulation and Hygiene: Total assistance Functional mobility during ADLs: Maximal assistance    Mobility  Overal bed mobility: Needs Assistance Bed Mobility: Rolling, Supine to Sit, Sit to Supine Rolling: Mod assist Supine to sit: Mod assist, +2 for physical assistance, +2 for safety/equipment Sit to supine: Mod assist, +2 for safety/equipment, +2 for physical assistance General bed mobility comments: modA to manage LE movement and elevation of trunk.    Transfers  Overall transfer level: Needs assistance Equipment used: 2 person hand held assist  Transfers: Sit to/from Stand, Bed to chair/wheelchair/BSC Sit to Stand: +2 physical assistance, +2 safety/equipment, From elevated surface, Mod assist Bed to/from chair/wheelchair/BSC transfer type:: Stand pivot Stand pivot transfers: Mod assist, +2 physical assistance, +2 safety/equipment General transfer comment: Needs incr cues to initiate movement with mod assist to power up.  Once up, then min assist to maintain standing. Provided blocking bil knees prn without significant buckling noted and assist pt at buttocks to stand tall and step around from bed to 3n1. Once pt urinated, assist 3N1 to recliner with same assist.    Ambulation / Gait / Stairs / Wheelchair Mobility  Ambulation/Gait General Gait Details: TBA    Posture / Balance Dynamic Sitting Balance Sitting balance - Comments: initially minA to maintain sitting, progressed to moments of CGA Balance Overall balance assessment: Needs assistance Sitting-balance support: Bilateral upper extremity supported, Feet supported Sitting balance-Leahy Scale: Poor Sitting balance - Comments: initially minA to maintain sitting, progressed to moments of CGA Postural control: Posterior lean, Left lateral lean Standing balance support: Bilateral upper extremity supported, During functional activity Standing  balance-Leahy Scale: Poor Standing balance comment: dependent on mod A of 2 to maintain static standing progressing to min assist of 1 static standing.    Special needs/care consideration Special service needs ***     Previous Home Environment (from acute therapy documentation) Living Arrangements: Children  Lives With: Other (Comment) Available Help at Discharge: Family, Available PRN/intermittently (daughter works) Type of Home: [employer] Layout: Two level, Able to live on main level with bedroom/bathroom Alternate Level Stairs-Rails: Right Alternate Level Stairs-Number of Steps: flight Home Access: Stairs to enter Entrance Stairs-Rails: None Entrance Stairs-Number of Steps: 3 Bathroom Shower/Tub: Health visitor: Pharmacist, community: Yes How Accessible: Accessible via walker Home Care Services: No Additional Comments: grandson present, pt unreliable historian  Discharge Living Setting Plans for Discharge Living Setting: Patient's home Type of Home at Discharge: House Discharge Home Layout: Able to live on main level with bedroom/bathroom Discharge Home Access: Stairs to enter Entrance Stairs-Rails: None Entrance Stairs-Number of Steps: 3 Discharge Bathroom Shower/Tub: Walk-in shower Discharge Bathroom Toilet: Standard Discharge Bathroom Accessibility: Yes How Accessible: Accessible via walker Does the patient have any problems obtaining your medications?: No  Social/Family/Support Systems Patient Roles: Spouse Contact Information: [phone] Anticipated Caregiver's Contact Information: 24/7 Ability/Limitations of Caregiver: Min A Caregiver Availability: 24/7 Discharge Plan Discussed with Primary Caregiver: Yes Is Caregiver In Agreement with Plan?: No Does Caregiver/Family have Issues with Lodging/Transportation while Pt is in Rehab?: No   Goals Patient/Family Goal for Rehab: PT/OT/SLP Min A Expected length of stay: 14-16  days Pt/Family Agrees to Admission and willing to participate: Yes Program Orientation Provided & Reviewed with Pt/Caregiver Including Roles  & Responsibilities: Yes   Decrease burden of Care through IP rehab admission: not anticipated    Possible need for SNF placement upon discharge:not anticipated   Patient Condition: This patient's medical and functional status has changed since the consult dated: 10/04/23 in which the Rehabilitation Physician determined and documented that the patient's condition is appropriate for intensive rehabilitative care in an inpatient rehabilitation facility. See "History of Present Illness" (above) for medical update. Functional changes are: ***. Patient's medical and functional status update has been discussed with the Rehabilitation physician and patient remains appropriate for inpatient rehabilitation. Will admit to inpatient rehab today.  Preadmission Screen Completed By:  Dorena Gander, CCC-SLP, 10/06/2023 10:19 AM ______________________________________________________________________   Discussed status with Dr. Aaron Aason***at *** and received approval for admission today.  Admission Coordinator:  Dorena Gander, time***/Date***

## 2023-10-06 NOTE — Plan of Care (Signed)
  Problem: Education: Goal: Knowledge of General Education information will improve Description: Including pain rating scale, medication(s)/side effects and non-pharmacologic comfort measures Outcome: Progressing   Problem: Health Behavior/Discharge Planning: Goal: Ability to manage health-related needs will improve Outcome: Progressing   Problem: Clinical Measurements: Goal: Ability to maintain clinical measurements within normal limits will improve Outcome: Progressing Goal: Will remain free from infection Outcome: Progressing Goal: Diagnostic test results will improve Outcome: Progressing Goal: Respiratory complications will improve Outcome: Progressing Goal: Cardiovascular complication will be avoided Outcome: Progressing   Problem: Activity: Goal: Risk for activity intolerance will decrease Outcome: Progressing   Problem: Nutrition: Goal: Adequate nutrition will be maintained Outcome: Progressing   Problem: Coping: Goal: Level of anxiety will decrease Outcome: Progressing   Problem: Elimination: Goal: Will not experience complications related to bowel motility Outcome: Progressing Goal: Will not experience complications related to urinary retention Outcome: Progressing   Problem: Pain Managment: Goal: General experience of comfort will improve and/or be controlled Outcome: Progressing   Problem: Safety: Goal: Ability to remain free from injury will improve Outcome: Progressing   Problem: Skin Integrity: Goal: Risk for impaired skin integrity will decrease Outcome: Progressing   Problem: Education: Goal: Ability to describe self-care measures that may prevent or decrease complications (Diabetes Survival Skills Education) will improve Outcome: Progressing Goal: Individualized Educational Video(s) Outcome: Progressing   Problem: Coping: Goal: Ability to adjust to condition or change in health will improve Outcome: Progressing   Problem: Fluid  Volume: Goal: Ability to maintain a balanced intake and output will improve Outcome: Progressing   Problem: Health Behavior/Discharge Planning: Goal: Ability to identify and utilize available resources and services will improve Outcome: Progressing Goal: Ability to manage health-related needs will improve Outcome: Progressing   Problem: Metabolic: Goal: Ability to maintain appropriate glucose levels will improve Outcome: Progressing   Problem: Nutritional: Goal: Maintenance of adequate nutrition will improve Outcome: Progressing Goal: Progress toward achieving an optimal weight will improve Outcome: Progressing   Problem: Skin Integrity: Goal: Risk for impaired skin integrity will decrease Outcome: Progressing   Problem: Tissue Perfusion: Goal: Adequacy of tissue perfusion will improve Outcome: Progressing   Problem: Activity: Goal: Ability to tolerate increased activity will improve Outcome: Progressing   Problem: Respiratory: Goal: Ability to maintain a clear airway and adequate ventilation will improve Outcome: Progressing   Problem: Role Relationship: Goal: Method of communication will improve Outcome: Progressing   Problem: Education: Goal: Ability to describe self-care measures that may prevent or decrease complications (Diabetes Survival Skills Education) will improve Outcome: Progressing Goal: Individualized Educational Video(s) Outcome: Progressing   Problem: Cardiac: Goal: Ability to maintain an adequate cardiac output will improve Outcome: Progressing   Problem: Health Behavior/Discharge Planning: Goal: Ability to identify and utilize available resources and services will improve Outcome: Progressing Goal: Ability to manage health-related needs will improve Outcome: Progressing   Problem: Fluid Volume: Goal: Ability to achieve a balanced intake and output will improve Outcome: Progressing   Problem: Metabolic: Goal: Ability to maintain  appropriate glucose levels will improve Outcome: Progressing   Problem: Nutritional: Goal: Maintenance of adequate nutrition will improve Outcome: Progressing Goal: Maintenance of adequate weight for body size and type will improve Outcome: Progressing   Problem: Respiratory: Goal: Will regain and/or maintain adequate ventilation Outcome: Progressing   Problem: Urinary Elimination: Goal: Ability to achieve and maintain adequate renal perfusion and functioning will improve Outcome: Progressing

## 2023-10-06 NOTE — Progress Notes (Signed)
 Lori Gates  WUJ:[id number] DOB: May 19, 1956 DOA: 09/24/2023 PCP: Default, Provider, MD    Brief Narrative:  68 year old with uncontrolled DM2, CKD stage IV, HTN, diastolic CHF, and cocaine abuse who was admitted to the hospital 4/29 after she was found down on the floor unresponsive and seizing and ultimately diagnosed with DKA with seizure activity.  She required endotracheal intubation upon arrival to the ER, and was admitted to the ICU.  CT head was negative for acute bleeding.  MRI brain was negative for acute process.  After correction of her DKA she was able to be extubated but suffered with stridor post-extubation.  ENT was consulted and the patient was found to have a left vocal cord polyp.  She was taken to the OR by ENT with excision of the polyp and required approximately 12 hours of rest on the ventilator postoperatively before she could be re-extubated.  Significant Events: 4/29 found unconscious and seizing on floor -intubated in ER -admit to ICU in DKA 5/1 EEG negative for ongoing seizures -temporary HD cath placed 5/2 HD performed 5/4 extubated -postextubation developed stridor 5/6 ongoing stridor without respiratory distress -ENT performed fiberoptic laryngoscopy showing right vocal fold paralysis and large hemorrhagic polyp on the left vocal fold  5/7 2 OR for left vocal fold polyp excision -remained intubated postop due to low tidal volumes requiring additional night in ICU 5/9 transferred to [facility]  Goals of Care:   Code Status: Full Code   DVT prophylaxis: heparin  injection 5,000 Units Start: 09/25/23 0600   Interim Hx: Afebrile.  Blood pressure control improved but not yet ideal.  CBGs improving no acute events recorded overnight.  The patient is confused today.  She does not appear to be in any acute distress.  She is able to answer some simple questions but cannot carry on a conversation.  Respirations are nonlabored.  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 Status post polypectomy by ENT 5/7 - ENT to consider repeat flexible scope exam to re-evaluate her right vocal cord - if her vocal cord remains inoperable she will need a contrasted CT of the neck and chest to evaluate her recurrent laryngeal nerve - presently it is not felt wise to administer IV contrast while renal function is recovering  Metabolic encephalopathy due to DKA and toxic encephalopathy due to cocaine Mental status slow to improve -TSH elevated but free T4 normal -B12 is normal -folic acid is low at 5.5  Folic acid deficiency Likely simply nutritional in setting of substance abuse -supplement  Seizure with postictal state Incited by DKA - continue Keppra   Acute kidney injury on CKD stage IV Required HD during initial portion of hospital stay -nephrology following -baseline creatinine approximately 2.6 - HD cath removed 5/10 -creatinine holding steady at baseline presently  Recent Labs  Lab 10/02/23 0525 10/03/23 0500 10/04/23 0442 10/05/23 0559 10/06/23 0822  CREATININE 2.33* 2.32*  2.36* 2.41* 2.40* 2.24*    Normocytic anemia Likely simple  anemia of chronic kidney disease -follow trend intermittently  Paroxysmal atrial fibrillation NSR at present   Uncontrolled HTN Blood pressure improved with adjustment in medical therapy yesterday -follow-up without change in medical therapy for  today  HLD Continue Lipitor   Chronic diastolic CHF Appears euvolemic at present  Cocaine abuse UDS positive for cocaine at admission -patient has been counseled that she absolutely must discontinue cocaine use permanently  Family Communication: No family present at time of exam Disposition:  CIR v/s SNF for rehab stay    Objective: Blood pressure (!) 165/70, pulse (!) 57, temperature 98 F (36.7 C), temperature source Oral, resp. rate 18, height 5\' 5"  (1.651 m), weight 61 kg, SpO2 99%.  Intake/Output Summary (Last 24 hours) at 10/06/2023 1554 Last data filed at 10/06/2023 1302 Gross per 24 hour  Intake 1900 ml  Output 1200 ml  Net 700 ml   Filed Weights   10/02/23 0753 10/03/23 0500 10/04/23 0412  Weight: 66.6 kg 66.5 kg 61 kg    Examination: General: No acute respiratory distress - confused -less alert today Lungs: Clear to auscultation bilaterally  Cardiovascular: Regular rate and rhythm -no murmur Abdomen: Nontender, nondistended, soft, bowel sounds positive, no rebound Extremities: No significant edema bilateral lower extremities  CBC: Recent Labs  Lab 09/30/23 0911 10/01/23 1129 10/02/23 1402 10/04/23 0442 10/05/23 0559 10/06/23 0822  WBC 4.8 6.0   < > 7.2 7.3 6.2  NEUTROABS 3.5 2.9  --   --   --   --   HGB 8.5* 10.4*   < > 8.7* 8.3* 8.4*  HCT 26.7* 32.0*   < > 27.8* 26.6* 26.5*  MCV 95.7 94.7   < > 97.2 99.3 97.4  PLT 188 267   < > 327 306 277   < > = values in this interval not displayed.   Basic Metabolic Panel: Recent Labs  Lab 09/30/23 0631 10/01/23 0549 10/01/23 1129 10/02/23 0525 10/04/23 0442 10/05/23 0559 10/06/23 0822  NA 140   < > 141   < > 143 145 144  K 3.9   < > 3.5   < > 4.1 4.1 4.0  CL 106   < > 103   < > 108 107 110  CO2 23   < > 24   < > 26 27 25   GLUCOSE 107*   < > 240*   < > 253* 319* 161*  BUN 34*   < > 35*   < > 36* 38* 37*  CREATININE 2.23*   < > 2.27*   < > 2.41* 2.40* 2.24*  CALCIUM  8.0*   < > 8.2*   < > 8.2* 8.5*  8.1*  MG 2.1  --  2.2  --   --   --   --   PHOS 4.3  --   --    < > 3.3 3.7 3.8   < > = values in this interval not displayed.   GFR: Estimated Creatinine Clearance: 21.6 mL/min (A) (by C-G formula based on SCr of 2.24 mg/dL (H)).   Scheduled Meds:  atorvastatin   80 mg Oral Daily   Chlorhexidine  Gluconate Cloth  6 each Topical Daily   docusate sodium   100 mg Oral BID   heparin   5,000 Units Subcutaneous Q8H   insulin  aspart  0-20 Units Subcutaneous TID WC   insulin  aspart  0-5 Units Subcutaneous QHS   insulin  glargine-yfgn  20 Units Subcutaneous Daily   levETIRAcetam   500 mg Oral BID   metoprolol   tartrate  100 mg Oral BID   NIFEdipine  90 mg Oral Daily   mouth rinse  15 mL Mouth Rinse 4 times per day   pantoprazole   40 mg Oral Daily   polyethylene glycol  17 g Oral Daily   sodium chloride  flush  10-40 mL Intracatheter Q12H   thiamine   100 mg Oral Daily   torsemide   20 mg Oral BID      LOS: 12 days   Abbe Abate, MD [facility] Office  [phone] Pager - Text Page per Tilford Foley  If 7PM-7AM, please contact night-coverage per Amion 10/06/2023, 3:54 PM

## 2023-10-07 DIAGNOSIS — R569 Unspecified convulsions: Secondary | ICD-10-CM | POA: Diagnosis not present

## 2023-10-07 LAB — GLUCOSE, CAPILLARY
Glucose-Capillary: 165 mg/dL — ABNORMAL HIGH (ref 70–99)
Glucose-Capillary: 173 mg/dL — ABNORMAL HIGH (ref 70–99)
Glucose-Capillary: 139 mg/dL — ABNORMAL HIGH (ref 70–99)
Glucose-Capillary: 157 mg/dL — ABNORMAL HIGH (ref 70–99)

## 2023-10-07 MED ORDER — TORSEMIDE 20 MG PO TABS
20.0000 mg | ORAL_TABLET | Freq: Every day | ORAL | Status: DC
  Administered 2023-10-07 – 2023-10-19 (×13): 20 mg via ORAL
  Filled 2023-10-07 (×12): qty 1

## 2023-10-07 MED ORDER — QUETIAPINE FUMARATE 25 MG PO TABS
12.5000 mg | ORAL_TABLET | Freq: Every day | ORAL | Status: DC
  Administered 2023-10-07 – 2023-10-18 (×12): 12.5 mg via ORAL
  Filled 2023-10-07 (×12): qty 1

## 2023-10-07 NOTE — Progress Notes (Signed)
 NOVA WORMAN  ZOX:[id number] DOB: [birth year] DOA: 09/24/2023 PCP: Default, Provider, MD    Brief Narrative:  68 year old with uncontrolled DM2, CKD stage IV, HTN, diastolic CHF, and cocaine abuse who was admitted to the hospital 4/29 after she was found down on the floor unresponsive and seizing and ultimately diagnosed with DKA with seizure activity.  She required endotracheal intubation upon arrival to the ER, and was admitted to the ICU.  CT head was negative for acute bleeding.  MRI brain was negative for acute process.  After correction of her DKA she was able to be extubated but suffered with stridor post-extubation.  ENT was consulted and the patient was found to have a left vocal cord polyp.  She was taken to the OR by ENT with excision of the polyp and required approximately 12 hours of rest on the ventilator postoperatively before she could be re-extubated.  Significant Events: 4/29 found unconscious and seizing on floor -intubated in ER -admit to ICU in DKA 5/1 EEG negative for ongoing seizures -temporary HD cath placed 5/2 HD performed 5/4 extubated -postextubation developed stridor 5/6 ongoing stridor without respiratory distress -ENT performed fiberoptic laryngoscopy showing right vocal fold paralysis and large hemorrhagic polyp on the left vocal fold  5/7 2 OR for left vocal fold polyp excision -remained intubated postop due to low tidal volumes requiring additional night in ICU 5/9 transferred to [facility]  Goals of Care:   Code Status: Full Code   DVT prophylaxis: heparin  injection 5,000 Units Start: 09/25/23 0600   Interim Hx: No acute events recorded overnight.  Afebrile.  Vital signs stable with systolic blood pressure modestly elevated at 163.  Much more alert and conversant today.  Denies any new complaints.  Is tolerating her lunch without difficulty.  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 Status post polypectomy by ENT 5/7 - ENT to consider repeat flexible scope exam to re-evaluate her right vocal cord - if her vocal cord remains inoperable she will need a contrasted CT of the neck and chest to evaluate her recurrent laryngeal nerve - presently it is not felt wise to administer IV contrast while renal function is recovering  Metabolic encephalopathy due to DKA and toxic encephalopathy due to cocaine Mental status slow to improve -TSH elevated but free T4 normal - B12 is normal -folic acid is low at 5.5  Folic acid deficiency Likely simply nutritional in setting of substance abuse - supplementing  Seizure with postictal state Incited by DKA - continue Keppra   Acute kidney injury on CKD stage IV Required HD during initial portion of hospital stay - Nephrology following - baseline creatinine approximately 2.6 - HD cath removed 5/10 - creatinine holding steady at baseline presently  Recent Labs  Lab 10/02/23 0525 10/03/23 0500 10/04/23 0442 10/05/23 0559 10/06/23 0822  CREATININE 2.33* 2.32*  2.36* 2.41* 2.40* 2.24*    Normocytic anemia Likely simple anemia of chronic kidney disease - hemoglobin holding steady  Paroxysmal atrial fibrillation NSR at  present   Uncontrolled HTN Monitor trend today  HLD Continue Lipitor   Chronic diastolic CHF Appears euvolemic at present  Cocaine abuse UDS positive for cocaine at admission -patient has been counseled that she absolutely must discontinue cocaine use permanently  Family  Communication: No family present at time of exam Disposition:  CIR v/s SNF for rehab stay - medically ready for d/c when bed available    Objective: Blood pressure (!) 163/65, pulse 63, temperature 98.4 F (36.9 C), resp. rate 19, height 5\' 5"  (1.651 m), weight 61 kg, SpO2 99%.  Intake/Output Summary (Last 24 hours) at 10/07/2023 0808 Last data filed at 10/07/2023 0453 Gross per 24 hour  Intake 1000 ml  Output 750 ml  Net 250 ml   Filed Weights   10/02/23 0753 10/03/23 0500 10/04/23 0412  Weight: 66.6 kg 66.5 kg 61 kg    Examination: General: No acute respiratory distress -alert and conversant Lungs: Clear to auscultation bilaterally without wheezing Cardiovascular: Regular rate and rhythm -no murmur Abdomen: Nontender, nondistended, soft, bowel sounds positive, no rebound Extremities: No significant edema bilateral lower extremities  CBC: Recent Labs  Lab 09/30/23 0911 10/01/23 1129 10/02/23 1402 10/04/23 0442 10/05/23 0559 10/06/23 0822  WBC 4.8 6.0   < > 7.2 7.3 6.2  NEUTROABS 3.5 2.9  --   --   --   --   HGB 8.5* 10.4*   < > 8.7* 8.3* 8.4*  HCT 26.7* 32.0*   < > 27.8* 26.6* 26.5*  MCV 95.7 94.7   < > 97.2 99.3 97.4  PLT 188 267   < > 327 306 277   < > = values in this interval not displayed.   Basic Metabolic Panel: Recent Labs  Lab 10/01/23 1129 10/02/23 0525 10/04/23 0442 10/05/23 0559 10/06/23 0822  NA 141   < > 143 145 144  K 3.5   < > 4.1 4.1 4.0  CL 103   < > 108 107 110  CO2 24   < > 26 27 25   GLUCOSE 240*   < > 253* 319* 161*  BUN 35*   < > 36* 38* 37*  CREATININE 2.27*   < > 2.41* 2.40* 2.24*  CALCIUM  8.2*   < > 8.2* 8.5* 8.1*  MG 2.2  --   --   --   --   PHOS  --    < > 3.3 3.7 3.8   < > = values in this interval not displayed.   GFR: Estimated Creatinine Clearance: 21.6 mL/min (A) (by C-G formula based on SCr of 2.24 mg/dL (H)).   Scheduled Meds:  atorvastatin   80 mg Oral Daily   Chlorhexidine  Gluconate Cloth  6 each Topical Daily    docusate sodium   100 mg Oral BID   folic acid  1 mg Oral Daily   heparin   5,000 Units Subcutaneous Q8H   insulin  aspart  0-20 Units Subcutaneous TID WC   insulin  aspart  0-5 Units Subcutaneous QHS   insulin  glargine-yfgn  20 Units Subcutaneous Daily   levETIRAcetam   500 mg Oral BID   metoprolol  tartrate  100 mg Oral BID   NIFEdipine  90 mg Oral Daily   mouth rinse  15 mL Mouth Rinse 4 times per day   pantoprazole   40 mg Oral Daily   polyethylene glycol  17 g Oral Daily   sodium chloride  flush  10-40 mL Intracatheter Q12H   thiamine   100 mg Oral Daily   torsemide   20 mg  Oral BID      LOS: 13 days   Abbe Abate, MD [facility] Office  [phone] Pager - Text Page per Amion  If 7PM-7AM, please contact night-coverage per Amion 10/07/2023, 8:08 AM

## 2023-10-07 NOTE — Plan of Care (Signed)
   Problem: Education: Goal: Knowledge of General Education information will improve Description Including pain rating scale, medication(s)/side effects and non-pharmacologic comfort measures Outcome: Progressing   Problem: Health Behavior/Discharge Planning: Goal: Ability to manage health-related needs will improve Outcome: Progressing

## 2023-10-07 NOTE — Inpatient Diabetes Management (Signed)
 Inpatient Diabetes Program Recommendations  AACE/ADA: New Consensus Statement on Inpatient Glycemic Control (2015)  Target Ranges:  Prepandial:   less than 140 mg/dL      Peak postprandial:   less than 180 mg/dL (1-2 hours)      Critically ill patients:  140 - 180 mg/dL   Lab Results  Component Value Date   GLUCAP 173 (H) 10/07/2023   HGBA1C 14.3 (H) 09/25/2023    Review of Glycemic Control  Latest Reference Range & Units 10/06/23 08:08 10/06/23 11:57 10/06/23 12:37 10/06/23 17:06 10/06/23 19:44 10/07/23 07:45 10/07/23 12:03  Glucose-Capillary 70 - 99 mg/dL 409 (H) 50 (L) 56 (L) 811 (H) 132 (H) 165 (H) 173 (H)   Diabetes history: DM 2 Outpatient Diabetes medications: 70/30 listed in addition to Humalog and Lantus , Rybelsus Daily Current orders for Inpatient glycemic control:  Semglee  20 units Daily Novolog  0-20 units tid + hs  A1c 14.3% on 4/30  Generally spoke with pt at bedside regarding her A1c level and glucose control at home prior to admission. Pt reports being on insulin  prior to the hospitalization but cannot remember the name of the insulin . Discussed A1c level currently and reviewed glucose and A1c goals. Pt reports she did check her glucose but was not always good at taking her insulin  as prescribed. Encouraged a better schedule when she is able to get home to reminder her to take her medications consistently. Pt to go to rehab at some point.  Thanks,  Eloise Hake RN, MSN, BC-ADM Inpatient Diabetes Coordinator Team Pager [phone] (8a-5p)

## 2023-10-07 NOTE — Plan of Care (Signed)
  Problem: Education: Goal: Knowledge of General Education information will improve Description: Including pain rating scale, medication(s)/side effects and non-pharmacologic comfort measures Outcome: Progressing   Problem: Health Behavior/Discharge Planning: Goal: Ability to manage health-related needs will improve Outcome: Progressing   Problem: Clinical Measurements: Goal: Ability to maintain clinical measurements within normal limits will improve Outcome: Progressing Goal: Will remain free from infection Outcome: Progressing Goal: Diagnostic test results will improve Outcome: Progressing Goal: Respiratory complications will improve Outcome: Progressing Goal: Cardiovascular complication will be avoided Outcome: Progressing   Problem: Activity: Goal: Risk for activity intolerance will decrease Outcome: Progressing   Problem: Nutrition: Goal: Adequate nutrition will be maintained Outcome: Progressing   Problem: Coping: Goal: Level of anxiety will decrease Outcome: Progressing   Problem: Elimination: Goal: Will not experience complications related to bowel motility Outcome: Progressing Goal: Will not experience complications related to urinary retention Outcome: Progressing   Problem: Pain Managment: Goal: General experience of comfort will improve and/or be controlled Outcome: Progressing   Problem: Safety: Goal: Ability to remain free from injury will improve Outcome: Progressing   Problem: Skin Integrity: Goal: Risk for impaired skin integrity will decrease Outcome: Progressing   Problem: Education: Goal: Ability to describe self-care measures that may prevent or decrease complications (Diabetes Survival Skills Education) will improve Outcome: Progressing Goal: Individualized Educational Video(s) Outcome: Progressing   Problem: Coping: Goal: Ability to adjust to condition or change in health will improve Outcome: Progressing   Problem: Fluid  Volume: Goal: Ability to maintain a balanced intake and output will improve Outcome: Progressing   Problem: Health Behavior/Discharge Planning: Goal: Ability to identify and utilize available resources and services will improve Outcome: Progressing Goal: Ability to manage health-related needs will improve Outcome: Progressing   Problem: Metabolic: Goal: Ability to maintain appropriate glucose levels will improve Outcome: Progressing   Problem: Nutritional: Goal: Maintenance of adequate nutrition will improve Outcome: Progressing Goal: Progress toward achieving an optimal weight will improve Outcome: Progressing   Problem: Skin Integrity: Goal: Risk for impaired skin integrity will decrease Outcome: Progressing   Problem: Tissue Perfusion: Goal: Adequacy of tissue perfusion will improve Outcome: Progressing   Problem: Activity: Goal: Ability to tolerate increased activity will improve Outcome: Progressing   Problem: Respiratory: Goal: Ability to maintain a clear airway and adequate ventilation will improve Outcome: Progressing   Problem: Role Relationship: Goal: Method of communication will improve Outcome: Progressing   Problem: Education: Goal: Ability to describe self-care measures that may prevent or decrease complications (Diabetes Survival Skills Education) will improve Outcome: Progressing Goal: Individualized Educational Video(s) Outcome: Progressing   Problem: Cardiac: Goal: Ability to maintain an adequate cardiac output will improve Outcome: Progressing   Problem: Health Behavior/Discharge Planning: Goal: Ability to identify and utilize available resources and services will improve Outcome: Progressing Goal: Ability to manage health-related needs will improve Outcome: Progressing   Problem: Fluid Volume: Goal: Ability to achieve a balanced intake and output will improve Outcome: Progressing   Problem: Metabolic: Goal: Ability to maintain  appropriate glucose levels will improve Outcome: Progressing   Problem: Nutritional: Goal: Maintenance of adequate nutrition will improve Outcome: Progressing Goal: Maintenance of adequate weight for body size and type will improve Outcome: Progressing   Problem: Respiratory: Goal: Will regain and/or maintain adequate ventilation Outcome: Progressing   Problem: Urinary Elimination: Goal: Ability to achieve and maintain adequate renal perfusion and functioning will improve Outcome: Progressing

## 2023-10-07 NOTE — Evaluation (Addendum)
 Occupational Therapy Treatment Patient Details Name: Lori Gates MRN: [id number] DOB: 12/22/1955 Today's Date: 10/07/2023   History of Present Illness   68 y.o. female admitted 09/24/23 after being found down, unresponsive, seizure and hyperglycemia; UDS (+) cocaine, benzos. Workup for DKA, seizures, UTI, sepsis, aspiration PNA vs pneumonitis. 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 ETT 4/29-5/2. iHD initiated 5/1. S/p microlaryngoscopy with excision of L true vocal cord polyp 5/7; ETT 5/7-5/8. PMH includes asthma, DM2, HTN.     Clinical Impressions Pt up in chair upon arrival, reports being in chair ~1 hour. Pt needing mod A for standing with RW, able to ambulate ~32ft across room to [facility] with close chair follow. Pt needing min A for seated UB dressing, and total A for posterior pericare in standing. Pt with edematous LUE but still using functionally for BADL, elevated at end of session. Pt presenting with impairments listed below, will follow acutely. Patient will benefit from intensive inpatient follow-up therapy, >3 hours/day to maximize safety/ind with ADL/functional mobility.      If plan is discharge home, recommend the following:   Two people to help with walking and/or transfers;Two people to help with bathing/dressing/bathroom;Assistance with feeding;Assistance with cooking/housework;Direct supervision/assist for medications management;Direct supervision/assist for financial management;Assist for transportation;Help with stairs or ramp for entrance;Supervision due to cognitive status     Functional Status Assessment         Equipment Recommendations   BSC/3in1     Recommendations for Other Services   Rehab consult;Speech consult     Precautions/Restrictions   Precautions Precautions: Fall;Other (comment) Recall of Precautions/Restrictions: Impaired Precaution/Restrictions Comments: bladder/bowel  incontinence Restrictions Weight Bearing Restrictions Per Provider Order: No     Mobility Bed Mobility Overal bed mobility: Needs Assistance         Sit to supine: Mod assist        Transfers Overall transfer level: Needs assistance Equipment used: Rolling walker (2 wheels) Transfers: Sit to/from Stand Sit to Stand: Mod assist                  Balance Overall balance assessment: Needs assistance Sitting-balance support: No upper extremity supported, Feet unsupported Sitting balance-Leahy Scale: Fair     Standing balance support: Bilateral upper extremity supported, During functional activity Standing balance-Leahy Scale: Poor Standing balance comment: reliant on UE support and external assist fluctutating from min-modA                           ADL either performed or assessed with clinical judgement   ADL Overall ADL's : Needs assistance/impaired                 Upper Body Dressing : Minimal assistance Upper Body Dressing Details (indicate cue type and reason): pt holds up both arms to place in gown     Toilet Transfer: Moderate assistance;Ambulation;Rolling walker (2 wheels);BSC/3in1 Toilet Transfer Details (indicate cue type and reason): ~1ft from [facility]  <> bed Toileting- Clothing Manipulation and Hygiene: Total assistance;Sit to/from stand Toileting - Clothing Manipulation Details (indicate cue type and reason): posterior pericare in standing     Functional mobility during ADLs: Moderate assistance;Rolling walker (2 wheels)       Vision   Vision Assessment?: Vision impaired- to be further tested in functional context Additional Comments: functional for BADL/navigation in room, noted to have glasses on bedside table     Perception Perception: Not tested  Praxis Praxis: Not tested       Pertinent Vitals/Pain Pain Assessment Pain Assessment: Faces Pain Score: 4  Faces Pain Scale: Hurts little more Pain Location: R  leg Pain Descriptors / Indicators: Discomfort Pain Intervention(s): Limited activity within patient's tolerance, Monitored during session, Repositioned     Extremity/Trunk Assessment Upper Extremity Assessment Upper Extremity Assessment: RUE deficits/detail RUE Deficits / Details: decr FMC LUE Deficits / Details: decr FMC, but able to perform shoulder AROM, elbow flex/ext, and digit composit flex/ext noted to be edematous, elevated on blankets at end of session as no extra pillows on unit LUE Coordination: decreased fine motor   Lower Extremity Assessment Lower Extremity Assessment: Defer to PT evaluation       Communication Communication Communication: No apparent difficulties Factors Affecting Communication: Reduced clarity of speech;Difficulty expressing self   Cognition Arousal: Alert Behavior During Therapy: Flat affect               OT - Cognition Comments: disoriented to date, smiles when she stated her daughter brought her flowers, min cues to recall yesterday was Mother's Day                 Following commands: Impaired Following commands impaired: Follows one step commands inconsistently, Follows one step commands with increased time     Cueing  General Comments   Cueing Techniques: Verbal cues;Gestural cues;Tactile cues  VSS   Exercises     Shoulder Instructions      Home Living                                          Prior Functioning/Environment                      OT Problem List:     OT Treatment/Interventions:        OT Goals(Current goals can be found in the care plan section)   Acute Rehab OT Goals Patient Stated Goal: to return to bed OT Goal Formulation: With patient Time For Goal Achievement: 10/14/23 Potential to Achieve Goals: Good ADL Goals Pt Will Perform Grooming: sitting;with set-up;with supervision Pt Will Perform Upper Body Bathing: with set-up;with supervision Pt Will Perform Lower  Body Bathing: with min assist;sit to/from stand Pt Will Perform Lower Body Dressing: with mod assist;sit to/from stand Pt Will Transfer to Toilet: with min assist;stand pivot transfer Additional ADL Goal #1: pt will sequence basic mobility and ADL with min cues   OT Frequency:  Min 2X/week    Co-evaluation              AM-PAC OT "6 Clicks" Daily Activity     Outcome Measure Help from another person eating meals?: A Lot Help from another person taking care of personal grooming?: A Lot Help from another person toileting, which includes using toliet, bedpan, or urinal?: A Lot Help from another person bathing (including washing, rinsing, drying)?: A Lot Help from another person to put on and taking off regular upper body clothing?: A Lot Help from another person to put on and taking off regular lower body clothing?: A Lot 6 Click Score: 12   End of Session Equipment Utilized During Treatment: Gait belt;Rolling walker (2 wheels) Nurse Communication: Mobility status (needs new sacral foam, constant BM)  Activity Tolerance: Patient tolerated treatment well Patient left: with call bell/phone within reach;in bed;with bed alarm set  OT Visit Diagnosis: Unsteadiness on feet (R26.81);Muscle weakness (generalized) (M62.81);History of falling (Z91.81);Other symptoms and signs involving cognitive function;Other abnormalities of gait and mobility (R26.89)                Time: 1308-6578 OT Time Calculation (min): 21 min Charges:  OT General Charges $OT Visit: 1 Visit OT Treatments $Self Care/Home Management : 8-22 mins  Mackinley Cassaday K, OTD, OTR/L SecureChat Preferred Acute Rehab [phone]   Benedict Brain Koonce 10/07/2023, 10:45 AM

## 2023-10-07 NOTE — Progress Notes (Signed)
 Physical Therapy Treatment Patient Details Name: Lori Gates MRN: [id number] DOB: 1956/01/18 Today's Date: 10/07/2023   History of Present Illness 68 y.o. female admitted 09/24/23 after being found down, unresponsive, seizure and hyperglycemia; UDS (+) cocaine, benzos. Workup for DKA, seizures, UTI, sepsis, aspiration PNA vs pneumonitis. 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 ETT 4/29-5/2. iHD initiated 5/1. S/p microlaryngoscopy with excision of L true vocal cord polyp 5/7; ETT 5/7-5/8. PMH includes asthma, DM2, HTN.   PT Comments  Pt progressing with mobility; received sitting upright eating breakfast without assist, more alert, oriented and interactive compared to prior session. When it comes time to standing/OOB activity, pt starts crying and repeating, "I don't want to" requiring repeated, max encouragement to participate. Today, pt able to perform multiple stands and briefly step pivot with RW and min-maxA; pt unaware of bowel incontinence, dependent for posterior pericare while standing. Pt remains limited by generalized weakness, decreased activity tolerance, poor balance strategies/postural reactions and impaired cognition. Difficult to determine extent of true impairments vs decreased desire to participate. Continue to recommend intensive post-acute rehab to maximize functional mobility and independence pending improving activity tolerance and desire to participate.     If plan is discharge home, recommend the following: A lot of help with walking and/or transfers;Two people to help with bathing/dressing/bathroom;Assistance with cooking/housework;Direct supervision/assist for medications management;Direct supervision/assist for financial management;Assist for transportation;Help with stairs or ramp for entrance;Supervision due to cognitive status   Can travel by private vehicle      No  Equipment Recommendations   (TBD - potential  walker, wheelchair, BSC/3-in-1)    Recommendations for Other Services       Precautions / Restrictions Precautions Precautions: Fall;Other (comment) Recall of Precautions/Restrictions: Impaired Precaution/Restrictions Comments: bladder/bowel incontinence Restrictions Weight Bearing Restrictions Per Provider Order: No     Mobility  Bed Mobility Overal bed mobility: Needs Assistance             General bed mobility comments: received long sitting in bed eating breakfast, suspect she was able to sit herself up; scooting hips to EOB without assist once bedrail lowered    Transfers Overall transfer level: Needs assistance Equipment used: Rolling walker (2 wheels) Transfers: Sit to/from Stand Sit to Stand: Mod assist, Max assist   Step pivot transfers: Min assist, Mod assist       General transfer comment: pt pulling on RW, modA for trunk elevation and verbal cues for sequencing; additional sit<>stand from EOB with maxA, notable decreased pt effort; once standing, able to take pivotal steps with min-modA though quickly sitting self down once in recliner requiring modA for safe descent. modA sit<>stand from recliner to RW, max cues for hand placement    Ambulation/Gait               General Gait Details: pt adamantly declines   Stairs             Wheelchair Mobility     Tilt Bed    Modified Rankin (Stroke Patients Only)       Balance Overall balance assessment: Needs assistance Sitting-balance support: No upper extremity supported, Feet unsupported Sitting balance-Leahy Scale: Fair Sitting balance - Comments: prolonged static sitting EOB without UE or foot support, forward flexed posture; able to eat magic cup sitting EOB   Standing balance support: Bilateral upper extremity supported, During functional activity Standing balance-Leahy Scale: Poor Standing balance comment: reliant on UE support and external assist fluctutating from min-modA; totalA  for posterior pericare/washup from bowel incontinence                            Communication Communication Communication: No apparent difficulties  Cognition Arousal: Alert Behavior During Therapy: Lability, Flat affect   PT - Cognitive impairments: Awareness, Attention, Initiation, Problem solving, Safety/Judgement                       PT - Cognition Comments: difficult to determine true cognitive impairment vs decreased desire to participate. pt initially fairly conversant, received sitting up EOB eating breakfast with plate in lap, answering some questions appropriately (Monday, May 2025). when it came time to mobilizing, pt starting to cry, whine and state she doesn't want to, requiring max encouragement to try any mobility. later told pt to stop acting like she cannot move, which she smiled and chuckled at Following commands: Impaired Following commands impaired: Follows one step commands inconsistently, Follows one step commands with increased time    Cueing Cueing Techniques: Verbal cues, Gestural cues, Tactile cues  Exercises      General Comments General comments (skin integrity, edema, etc.): noted persistent LUE swelling though pt able to use functionally with decreased grip strength. pt requires max encouragement to attempt standing, repeatedly states, "I don't want to" and crying at times in apparent protest. pt with bowel incontinence in bed therefore agreeable to get to recliner though immediately asking about getting back to bed. pt had finished nearly entire breakfast, notable wet-sounding cough throughout session      Pertinent Vitals/Pain Pain Assessment Pain Assessment: Faces Faces Pain Scale: Hurts a little bit Pain Location: back, legs - unable to specify further Pain Descriptors / Indicators: Grimacing, Guarding Pain Intervention(s): Monitored during session, Repositioned    Home Living                          Prior Function             PT Goals (current goals can now be found in the care plan section) Progress towards PT goals: Progressing toward goals    Frequency    Min 2X/week      PT Plan      Co-evaluation              AM-PAC PT "6 Clicks" Mobility   Outcome Measure  Help needed turning from your back to your side while in a flat bed without using bedrails?: A Little Help needed moving from lying on your back to sitting on the side of a flat bed without using bedrails?: A Little Help needed moving to and from a bed to a chair (including a wheelchair)?: A Lot Help needed standing up from a chair using your arms (e.g., wheelchair or bedside chair)?: A Lot Help needed to walk in hospital room?: A Lot Help needed climbing 3-5 steps with a railing? : Total 6 Click Score: 13    End of Session Equipment Utilized During Treatment: Gait belt Activity Tolerance: Patient tolerated treatment well;Other (comment) (limited by decreased desire to participate) Patient left: in chair;with call bell/phone within reach;with chair alarm set Nurse Communication: Mobility status PT Visit Diagnosis: Muscle weakness (generalized) (M62.81);Unsteadiness on feet (R26.81);Other abnormalities of gait and mobility (R26.89)     Time: 1610-9604 PT Time Calculation (min) (ACUTE ONLY): 29 min  Charges:    $Therapeutic Activity: 23-37 mins PT General Charges $$ ACUTE PT  VISIT: 1 Visit                      Rhyse Skowron, PT, DPT Acute Rehabilitation Services  Personal: Secure Chat Rehab Office: [phone]  Albino Hum 10/07/2023, 10:02 AM

## 2023-10-08 DIAGNOSIS — R569 Unspecified convulsions: Secondary | ICD-10-CM | POA: Diagnosis not present

## 2023-10-08 LAB — BASIC METABOLIC PANEL WITH GFR
Anion gap: 5 (ref 5–15)
BUN: 49 mg/dL — ABNORMAL HIGH (ref 8–23)
CO2: 27 mmol/L (ref 22–32)
Calcium: 7.9 mg/dL — ABNORMAL LOW (ref 8.9–10.3)
Chloride: 110 mmol/L (ref 98–111)
Creatinine, Ser: 2.41 mg/dL — ABNORMAL HIGH (ref 0.44–1.00)
GFR, Estimated: 21 mL/min — ABNORMAL LOW (ref >60)
Glucose, Bld: 146 mg/dL — ABNORMAL HIGH (ref 70–99)
Potassium: 4.2 mmol/L (ref 3.5–5.1)
Sodium: 142 mmol/L (ref 135–145)

## 2023-10-08 LAB — GLUCOSE, CAPILLARY
Glucose-Capillary: 144 mg/dL — ABNORMAL HIGH (ref 70–99)
Glucose-Capillary: 255 mg/dL — ABNORMAL HIGH (ref 70–99)
Glucose-Capillary: 146 mg/dL — ABNORMAL HIGH (ref 70–99)
Glucose-Capillary: 207 mg/dL — ABNORMAL HIGH (ref 70–99)

## 2023-10-08 LAB — CBC
HCT: 25.3 % — ABNORMAL LOW (ref 36.0–46.0)
Hemoglobin: 8.1 g/dL — ABNORMAL LOW (ref 12.0–15.0)
MCH: 30.7 pg (ref 26.0–34.0)
MCHC: 32 g/dL (ref 30.0–36.0)
MCV: 95.8 fL (ref 80.0–100.0)
Platelets: 303 10*3/uL (ref 150–400)
RBC: 2.64 MIL/uL — ABNORMAL LOW (ref 3.87–5.11)
RDW: 12.7 % (ref 11.5–15.5)
WBC: 8.4 10*3/uL (ref 4.0–10.5)
nRBC: 0 % (ref 0.0–0.2)

## 2023-10-08 MED ORDER — NEBIVOLOL HCL 2.5 MG PO TABS: 2.5000 mg | ORAL_TABLET | Freq: Every day | ORAL | Status: DC

## 2023-10-08 MED ORDER — NEBIVOLOL HCL 5 MG PO TABS
5.0000 mg | ORAL_TABLET | Freq: Every day | ORAL | Status: DC
  Administered 2023-10-08 – 2023-10-09 (×2): 5 mg via ORAL
  Filled 2023-10-08 (×2): qty 1

## 2023-10-08 MED ORDER — ISOSORB DINITRATE-HYDRALAZINE 20-37.5 MG PO TABS
1.0000 | ORAL_TABLET | Freq: Three times a day (TID) | ORAL | Status: DC
  Administered 2023-10-08 – 2023-10-10 (×9): 1 via ORAL
  Filled 2023-10-08 (×9): qty 1

## 2023-10-08 NOTE — Plan of Care (Signed)
   Problem: Education: Goal: Knowledge of General Education information will improve Description Including pain rating scale, medication(s)/side effects and non-pharmacologic comfort measures Outcome: Progressing   Problem: Health Behavior/Discharge Planning: Goal: Ability to manage health-related needs will improve Outcome: Progressing

## 2023-10-08 NOTE — Progress Notes (Signed)
 Inpatient Rehab Admissions Coordinator:    I received insurance approval for CIR but do not have a bed for this pt. Today  Wandalee Gust, MS, CCC-SLP Rehab Admissions Coordinator  [phone] (celll) [phone] (office)

## 2023-10-08 NOTE — Progress Notes (Signed)
 Physical Therapy Treatment Patient Details Name: Lori Gates MRN: [id number] DOB: [birth year] Today's Date: 10/08/2023   History of Present Illness 68 y.o. female admitted 09/24/23 after being found down, unresponsive, seizure and hyperglycemia; UDS (+) cocaine, benzos. Workup for DKA, seizures, UTI, sepsis, aspiration PNA vs pneumonitis. 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 ETT 4/29-5/2. iHD initiated 5/1. S/p microlaryngoscopy with excision of L true vocal cord polyp 5/7; ETT 5/7-5/8. PMH includes asthma, DM2, HTN.    PT Comments  Pt received in supine, reluctant to participate in therapies but agreeable to EOB activity with heavy encouragement. BP stable supine to sitting but once sitting EOB, pt refusing standing trial or transfer OOB to chair despite encouragement. Pt motivated by desire to eat with food tray arriving at end of session. Pt up in bed chair posture at end of session bed alarm on for pt safety; NT notified pt bed linens damp and pt refusing OOB to [facility] or chair to change linens at time of session. Patient will benefit from intensive inpatient follow-up therapy, >3 hours/day.      If plan is discharge home, recommend the following: A lot of help with walking and/or transfers;Two people to help with bathing/dressing/bathroom;Assistance with cooking/housework;Direct supervision/assist for medications management;Direct supervision/assist for financial management;Assist for transportation;Help with stairs or ramp for entrance;Supervision due to cognitive status   Can travel by private vehicle        Equipment Recommendations  Other (comment) (TBD - potential walker, wheelchair, BSC/3-in-1)    Recommendations for Other Services       Precautions / Restrictions Precautions Precautions: Fall;Other (comment) Recall of Precautions/Restrictions: Impaired Precaution/Restrictions Comments: bladder/bowel incontinence, LUE  edema Restrictions Weight Bearing Restrictions Per Provider Order: No     Mobility  Bed Mobility Overal bed mobility: Needs Assistance Bed Mobility: Supine to Sit, Sit to Supine     Supine to sit: Min assist, HOB elevated, Used rails Sit to supine: Min assist, Used rails, HOB elevated        Transfers Overall transfer level: Needs assistance     Sit to Stand:  (pt refusing)          Lateral/Scoot Transfers: Max assist, Total assist General transfer comment: pt refusing STS trial despite max encouragement. Max to totalA for lateral scooting toward [facility] x3 reps with poor effort witnessed for final 2 attempts; bed pad assist    Ambulation/Gait                   Stairs             Wheelchair Mobility     Tilt Bed    Modified Rankin (Stroke Patients Only) Modified Rankin (Stroke Patients Only) Pre-Morbid Rankin Score: Moderate disability Modified Rankin: Severe disability     Balance Overall balance assessment: Needs assistance Sitting-balance support: No upper extremity supported, Feet unsupported Sitting balance-Leahy Scale: Fair         Standing balance comment: refusing to attempt                            Communication Communication Communication: No apparent difficulties Factors Affecting Communication: Reduced clarity of speech;Difficulty expressing self  Cognition Arousal: Alert Behavior During Therapy: Flat affect   PT - Cognitive impairments: Initiation, Sequencing, Problem solving, Safety/Judgement, Memory                       PT -  Cognition Comments: Difficult to determine true cognitive impairment vs decreased desire to participate. Pt reports "I'll get up and walk out of here" when she is discharged, but adamantly refusing to attempt sit<>stand despite encouragement and instruction on benefits of mobility. Pt bed linens appear damp and PTA offering to help her to pivot to [facility] but she refuses. NT notified  she needs purewick and/or bed linen change/bath. Following commands: Impaired Following commands impaired: Follows one step commands inconsistently, Follows one step commands with increased time    Cueing Cueing Techniques: Verbal cues, Gestural cues, Tactile cues  Exercises Other Exercises Other Exercises: pt refusing to attempt    General Comments General comments (skin integrity, edema, etc.): BP 152/70 (94) HR 71 bpm in supine with HOB ~60 deg; BP 148/81 (101) HR 79 bpm sitting EOB; pt refusing standing trial for BP assessment.      Pertinent Vitals/Pain Pain Assessment Pain Assessment: No/denies pain Pain Intervention(s): Limited activity within patient's tolerance, Monitored during session, Repositioned    Home Living                          Prior Function            PT Goals (current goals can now be found in the care plan section) Acute Rehab PT Goals Patient Stated Goal: to go home PT Goal Formulation: With patient Time For Goal Achievement: 10/14/23 Progress towards PT goals: Progressing toward goals (slowly due to poor motivation)    Frequency    Min 2X/week      PT Plan      Co-evaluation              AM-PAC PT "6 Clicks" Mobility   Outcome Measure  Help needed turning from your back to your side while in a flat bed without using bedrails?: A Little Help needed moving from lying on your back to sitting on the side of a flat bed without using bedrails?: A Little Help needed moving to and from a bed to a chair (including a wheelchair)?: Total Help needed standing up from a chair using your arms (e.g., wheelchair or bedside chair)?: Total Help needed to walk in hospital room?: Total Help needed climbing 3-5 steps with a railing? : Total 6 Click Score: 10    End of Session Equipment Utilized During Treatment: Gait belt Activity Tolerance: Other (comment);Treatment limited secondary to agitation (limited by decreased desire to  participate) Patient left: with call bell/phone within reach;in bed;with bed alarm set;Other (comment) (pt heels floated, bed in chair psoture, pt set up to eat dinner) Nurse Communication: Mobility status PT Visit Diagnosis: Muscle weakness (generalized) (M62.81);Unsteadiness on feet (R26.81);Other abnormalities of gait and mobility (R26.89)     Time: 1610-9604 PT Time Calculation (min) (ACUTE ONLY): 20 min  Charges:    $Therapeutic Activity: 8-22 mins PT General Charges $$ ACUTE PT VISIT: 1 Visit                     Taiden Raybourn P., PTA Acute Rehabilitation Services Secure Chat Preferred 9a-5:30pm Office: [phone]    Mariel Shope [facility] 10/08/2023, 6:28 PM

## 2023-10-08 NOTE — Progress Notes (Signed)
 Lori Gates  ZOX:[id number] DOB: [birth year] DOA: 09/24/2023 PCP: Default, Provider, MD    Brief Narrative:  68 year old with uncontrolled DM2, CKD stage IV, HTN, diastolic CHF, and cocaine abuse who was admitted to the hospital 4/29 after she was found down on the floor unresponsive and seizing and ultimately diagnosed with DKA with seizure activity.  She required endotracheal intubation upon arrival to the ER, and was admitted to the ICU.  CT head was negative for acute bleeding.  MRI brain was negative for acute process.  After correction of her DKA she was able to be extubated but suffered with stridor post-extubation.  ENT was consulted and the patient was found to have a left vocal cord polyp.  She was taken to the OR by ENT with excision of the polyp and required approximately 12 hours of rest on the ventilator postoperatively before she could be re-extubated.  Significant Events: 4/29 found unconscious and seizing on floor -intubated in ER -admit to ICU in DKA 5/1 EEG negative for ongoing seizures -temporary HD cath placed 5/2 HD performed 5/4 extubated -postextubation developed stridor 5/6 ongoing stridor without respiratory distress -ENT performed fiberoptic laryngoscopy showing right vocal fold paralysis and large hemorrhagic polyp on the left vocal fold  5/7 2 OR for left vocal fold polyp excision -remained intubated postop due to low tidal volumes requiring additional night in ICU 5/9 transferred to [facility]  Goals of Care:   Code Status: Full Code   DVT prophylaxis: heparin  injection 5,000 Units Start: 09/25/23 0600   Interim Hx: No acute events recorded overnight.  Afebrile.  Blood pressure elevated with systolics 157-195.  Alert and conversant but mildly confused today.  Denies specific complaints.  Assessment & Plan:  Uncontrolled DM2 - DKA with coma A1c 14 - DKA resolved - CBG now well-controlled with adjustments made in insulin  regimen during this admission  Acute  hypoxic and hypercapnic respiratory failure Due to seizure activity and DKA - resolved with patient now stable on room air  Aspiration pneumonia versus pneumonitis Has completed a 7-day course of empiric antibiotic therapy -  no persisting symptoms to suggest active infection  Post extubation stridor with right vocal fold paralysis and large left hemorrhagic polyp of the vocal fold Status post polypectomy by ENT 5/7 - ENT to consider repeat flexible scope exam to re-evaluate her right vocal cord - if her vocal cord remains inoperable she will need a contrasted CT of the neck and chest to evaluate her recurrent laryngeal nerve - presently it is not felt wise to administer IV contrast while renal function is recovering  Metabolic encephalopathy due to DKA and toxic encephalopathy due to cocaine Mental status slow to improve -TSH elevated but free T4 normal - B12 is normal -folic acid is low at 5.5 -making slow progress with stabilization of mental status -anticipate this will improve further with increasing activity and rehab  Folic acid deficiency Likely simply nutritional in setting of substance abuse - supplementing  Seizure with postictal state Incited by DKA - continue Keppra   Acute kidney injury on CKD stage IV Required HD during initial portion of hospital stay - Nephrology has assisted in her care- baseline creatinine approximately 2.6 - HD cath removed 5/10 - creatinine holding steady at baseline presently  Recent Labs  Lab 10/03/23 0500 10/04/23 0442 10/05/23 0559 10/06/23 0822 10/08/23 0634  CREATININE 2.32*  2.36* 2.41* 2.40* 2.24* 2.41*    Normocytic anemia Likely simple anemia of chronic kidney disease - hemoglobin able  Paroxysmal atrial fibrillation NSR at present   Uncontrolled HTN Adjust medical therapy today and follow trend  HLD Continue Lipitor   Chronic diastolic CHF Appears euvolemic at present  Cocaine abuse UDS positive for cocaine at admission  -patient has been counseled that she absolutely must discontinue cocaine use permanently  Family Communication: No family present at time of exam Disposition:  CIR -awaiting bed availability for discharge   Objective: Blood pressure (!) 195/73, pulse 60, temperature 98.4 F (36.9 C), resp. rate 18, height 5\' 5"  (1.651 m), weight 64.5 kg, SpO2 100%.  Intake/Output Summary (Last 24 hours) at 10/08/2023 0833 Last data filed at 10/07/2023 1734 Gross per 24 hour  Intake 118 ml  Output --  Net 118 ml   Filed Weights   10/03/23 0500 10/04/23 0412 10/08/23 0423  Weight: 66.5 kg 61 kg 64.5 kg    Examination: General: No acute respiratory distress  Lungs: Clear to auscultation bilaterally without wheezing Cardiovascular: Regular rate and rhythm -no murmur Abdomen: Nontender, nondistended, soft, bowel sounds positive, no rebound Extremities: No significant edema bilateral lower extremities  CBC: Recent Labs  Lab 10/01/23 1129 10/02/23 1402 10/05/23 0559 10/06/23 0822 10/08/23 0634  WBC 6.0   < > 7.3 6.2 8.4  NEUTROABS 2.9  --   --   --   --   HGB 10.4*   < > 8.3* 8.4* 8.1*  HCT 32.0*   < > 26.6* 26.5* 25.3*  MCV 94.7   < > 99.3 97.4 95.8  PLT 267   < > 306 277 303   < > = values in this interval not displayed.   Basic Metabolic Panel: Recent Labs  Lab 10/01/23 1129 10/02/23 0525 10/04/23 0442 10/05/23 0559 10/06/23 0822 10/08/23 0634  NA 141   < > 143 145 144 142  K 3.5   < > 4.1 4.1 4.0 4.2  CL 103   < > 108 107 110 110  CO2 24   < > 26 27 25 27   GLUCOSE 240*   < > 253* 319* 161* 146*  BUN 35*   < > 36* 38* 37* 49*  CREATININE 2.27*   < > 2.41* 2.40* 2.24* 2.41*  CALCIUM  8.2*   < > 8.2* 8.5* 8.1* 7.9*  MG 2.2  --   --   --   --   --   PHOS  --    < > 3.3 3.7 3.8  --    < > = values in this interval not displayed.   GFR: Estimated Creatinine Clearance: 20.1 mL/min (A) (by C-G formula based on SCr of 2.41 mg/dL (H)).   Scheduled Meds:  atorvastatin   80 mg Oral  Daily   Chlorhexidine  Gluconate Cloth  6 each Topical Daily   docusate sodium   100 mg Oral BID   folic acid  1 mg Oral Daily   heparin   5,000 Units Subcutaneous Q8H   insulin  aspart  0-20 Units Subcutaneous TID WC   insulin  aspart  0-5 Units Subcutaneous QHS   insulin  glargine-yfgn  20 Units Subcutaneous Daily   levETIRAcetam   500 mg Oral BID   metoprolol  tartrate  100 mg Oral BID   NIFEdipine  90 mg Oral Daily   mouth rinse  15 mL Mouth Rinse 4 times per day   pantoprazole   40 mg Oral Daily   polyethylene glycol  17 g Oral Daily   QUEtiapine  12.5 mg Oral QHS   sodium chloride  flush  10-40 mL Intracatheter Q12H  thiamine   100 mg Oral Daily   torsemide   20 mg Oral Daily      LOS: 14 days   Abbe Abate, MD [facility] Office  [phone] Pager - Text Page per Amion  If 7PM-7AM, please contact night-coverage per Amion 10/08/2023, 8:33 AM

## 2023-10-08 NOTE — Plan of Care (Signed)
  Problem: Education: Goal: Knowledge of General Education information will improve Description: Including pain rating scale, medication(s)/side effects and non-pharmacologic comfort measures Outcome: Progressing   Problem: Health Behavior/Discharge Planning: Goal: Ability to manage health-related needs will improve Outcome: Progressing   Problem: Clinical Measurements: Goal: Ability to maintain clinical measurements within normal limits will improve Outcome: Progressing Goal: Will remain free from infection Outcome: Progressing Goal: Diagnostic test results will improve Outcome: Progressing Goal: Respiratory complications will improve Outcome: Progressing Goal: Cardiovascular complication will be avoided Outcome: Progressing   Problem: Activity: Goal: Risk for activity intolerance will decrease Outcome: Progressing   Problem: Nutrition: Goal: Adequate nutrition will be maintained Outcome: Progressing   Problem: Coping: Goal: Level of anxiety will decrease Outcome: Progressing   Problem: Elimination: Goal: Will not experience complications related to bowel motility Outcome: Progressing Goal: Will not experience complications related to urinary retention Outcome: Progressing   Problem: Pain Managment: Goal: General experience of comfort will improve and/or be controlled Outcome: Progressing   Problem: Safety: Goal: Ability to remain free from injury will improve Outcome: Progressing   Problem: Skin Integrity: Goal: Risk for impaired skin integrity will decrease Outcome: Progressing   Problem: Education: Goal: Ability to describe self-care measures that may prevent or decrease complications (Diabetes Survival Skills Education) will improve Outcome: Progressing Goal: Individualized Educational Video(s) Outcome: Progressing   Problem: Coping: Goal: Ability to adjust to condition or change in health will improve Outcome: Progressing   Problem: Fluid  Volume: Goal: Ability to maintain a balanced intake and output will improve Outcome: Progressing   Problem: Health Behavior/Discharge Planning: Goal: Ability to identify and utilize available resources and services will improve Outcome: Progressing Goal: Ability to manage health-related needs will improve Outcome: Progressing   Problem: Metabolic: Goal: Ability to maintain appropriate glucose levels will improve Outcome: Progressing   Problem: Nutritional: Goal: Maintenance of adequate nutrition will improve Outcome: Progressing Goal: Progress toward achieving an optimal weight will improve Outcome: Progressing   Problem: Skin Integrity: Goal: Risk for impaired skin integrity will decrease Outcome: Progressing   Problem: Tissue Perfusion: Goal: Adequacy of tissue perfusion will improve Outcome: Progressing   Problem: Activity: Goal: Ability to tolerate increased activity will improve Outcome: Progressing   Problem: Respiratory: Goal: Ability to maintain a clear airway and adequate ventilation will improve Outcome: Progressing   Problem: Role Relationship: Goal: Method of communication will improve Outcome: Progressing   Problem: Education: Goal: Ability to describe self-care measures that may prevent or decrease complications (Diabetes Survival Skills Education) will improve Outcome: Progressing Goal: Individualized Educational Video(s) Outcome: Progressing   Problem: Cardiac: Goal: Ability to maintain an adequate cardiac output will improve Outcome: Progressing   Problem: Health Behavior/Discharge Planning: Goal: Ability to identify and utilize available resources and services will improve Outcome: Progressing Goal: Ability to manage health-related needs will improve Outcome: Progressing   Problem: Fluid Volume: Goal: Ability to achieve a balanced intake and output will improve Outcome: Progressing   Problem: Metabolic: Goal: Ability to maintain  appropriate glucose levels will improve Outcome: Progressing   Problem: Nutritional: Goal: Maintenance of adequate nutrition will improve Outcome: Progressing Goal: Maintenance of adequate weight for body size and type will improve Outcome: Progressing   Problem: Respiratory: Goal: Will regain and/or maintain adequate ventilation Outcome: Progressing   Problem: Urinary Elimination: Goal: Ability to achieve and maintain adequate renal perfusion and functioning will improve Outcome: Progressing

## 2023-10-09 LAB — GLUCOSE, CAPILLARY
Glucose-Capillary: 197 mg/dL — ABNORMAL HIGH (ref 70–99)
Glucose-Capillary: 236 mg/dL — ABNORMAL HIGH (ref 70–99)
Glucose-Capillary: 312 mg/dL — ABNORMAL HIGH (ref 70–99)
Glucose-Capillary: 279 mg/dL — ABNORMAL HIGH (ref 70–99)
Glucose-Capillary: 207 mg/dL — ABNORMAL HIGH (ref 70–99)

## 2023-10-09 MED ORDER — INSULIN GLARGINE-YFGN 100 UNIT/ML ~~LOC~~ SOLN
25.0000 [IU] | Freq: Every day | SUBCUTANEOUS | Status: DC
  Administered 2023-10-10: 25 [IU] via SUBCUTANEOUS
  Filled 2023-10-09 (×2): qty 0.25

## 2023-10-09 MED ORDER — NEBIVOLOL HCL 5 MG PO TABS
7.5000 mg | ORAL_TABLET | Freq: Every day | ORAL | Status: DC
  Administered 2023-10-10 – 2023-10-19 (×10): 7.5 mg via ORAL
  Filled 2023-10-09 (×12): qty 1

## 2023-10-09 MED ORDER — NEBIVOLOL HCL 2.5 MG PO TABS
2.5000 mg | ORAL_TABLET | Freq: Once | ORAL | Status: AC
  Administered 2023-10-09: 2.5 mg via ORAL
  Filled 2023-10-09: qty 1

## 2023-10-09 NOTE — Inpatient Diabetes Management (Signed)
 Inpatient Diabetes Program Recommendations  AACE/ADA: New Consensus Statement on Inpatient Glycemic Control (2015)  Target Ranges:  Prepandial:   less than 140 mg/dL      Peak postprandial:   less than 180 mg/dL (1-2 hours)      Critically ill patients:  140 - 180 mg/dL   Lab Results  Component Value Date   GLUCAP 279 (H) 10/09/2023   HGBA1C 14.3 (H) 09/25/2023    Latest Reference Range & Units 10/08/23 08:21 10/08/23 11:50 10/08/23 15:33 10/08/23 20:19 10/09/23 07:53 10/09/23 08:21 10/09/23 12:26 10/09/23 12:41  Glucose-Capillary 70 - 99 mg/dL 161 (H) 096 (H) 045 (H) 207 (H) 236 (H) 197 (H) 312 (H) 279 (H)  (H): Data is abnormally high  Diabetes history: DM 2 Outpatient Diabetes medications: 70/30 listed in addition to Humalog and Lantus , Rybelsus Daily Current orders for Inpatient glycemic control:  Semglee  20 units Daily Novolog  0-20 units tid + hs  Inpatient Diabetes Program Recommendations:   Please consider if postprandial CBGs >180: -Add Novolog  3 units tid meal coverage if eats 50% meal  Thank you, Marily Shows E. Joyce Heitman, RN, MSN, CDCES  Diabetes Coordinator Inpatient Glycemic Control Team Team Pager [phone] (8am-5pm) 10/09/2023 2:05 PM

## 2023-10-09 NOTE — Plan of Care (Signed)
  Problem: Elimination: Goal: Will not experience complications related to bowel motility Outcome: Progressing Goal: Will not experience complications related to urinary retention Outcome: Progressing   Problem: Pain Managment: Goal: General experience of comfort will improve and/or be controlled Outcome: Progressing   Problem: Safety: Goal: Ability to remain free from injury will improve Outcome: Progressing   Problem: Nutritional: Goal: Maintenance of adequate nutrition will improve Outcome: Progressing

## 2023-10-09 NOTE — Progress Notes (Signed)
 Speech Language Pathology Treatment: Dysphagia  Patient Details Name: JULYSSA BLACKLER MRN: [id number] DOB: 02/04/1956 Today's Date: 10/09/2023 Time: 7829-5621 SLP Time Calculation (min) (ACUTE ONLY): 7 min  Assessment / Plan / Recommendation Clinical Impression  Pt seen for ongoing dysphagia management.  RN reports no difficulty with PO intake.  Pt's vocal quality is improved with adequate phonation, but dysphonia noted.  There are still some noisy breath sounds, but no stridor noted.  Pt with et belching noted intermittent which was frequently followed by a cough.  Some wet vocal quality noted intermittently.  Pt frequently cleared throat independently, but benefited from cuing x1.  Recommend repeat MBS to determine if pt can advance liquid consistency.  Will tentatively plan for swallow study next date.  Recommend continuing mechanical soft solids with nectar/mildly thick liquids.     HPI HPI: Evette Muska is a 68 year old female who presented to ED after daughter found on floor actively seizing, CBG read "high". Continued to seize from time of EMS dispatch to arrival to ER, and was intubated for airway protection.  ETT 4/29-5/4.  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 Pt with poorly controlled IDDM, CKD stage IV, HTN, LLD, HFpEF, substance abuse (cocaine) followed by nephrology in [place].      SLP Plan  MBS      Recommendations for follow up therapy are one component of a multi-disciplinary discharge planning process, led by the attending physician.  Recommendations may be updated based on patient status, additional functional  criteria and insurance authorization.    Recommendations  Diet recommendations: Dysphagia 3 (mechanical soft);Nectar-thick liquid Liquids provided via: Cup;Straw Medication Administration: Whole meds with puree Supervision: Intermittent supervision to cue for compensatory strategies Compensations: Minimize environmental distractions;Slow rate;Small sips/bites Postural Changes and/or Swallow Maneuvers: Seated upright 90 degrees                  Oral care BID   Frequent or constant Supervision/Assistance Dysphagia, oropharyngeal phase (R13.12)     MBS     Elester Grim, MA, CCC-SLP Acute Rehabilitation Services Office: [phone] 10/09/2023, 12:26 PM

## 2023-10-09 NOTE — Progress Notes (Signed)
 Inpatient Rehab Admissions Coordinator:    I spoke with Pt.'s grandson and daughter who feel they could not care for Pt. Following d/c from CIR, as she has not progressed well and would likely require 24/7 mod assist at discharge. They would like to pursue SNF. I will let TOC know and sign off.   Wandalee Gust, MS, CCC-SLP Rehab Admissions Coordinator  [phone] (celll) [phone] (office)

## 2023-10-09 NOTE — Progress Notes (Signed)
 PT Cancellation Note  Patient Details Name: Lori Gates MRN: [id number] DOB: 02-28-1956   Cancelled Treatment:    Reason Eval/Treat Not Completed: (P) Patient declined, no reason specified (pt states "I don't feel good". RN checking VS and they appear WFL but pt continuing to refuse EOB/OOB mobility or repositioning.)   Mariel Shope Kaeo Jacome 10/09/2023, 6:13 PM

## 2023-10-09 NOTE — Progress Notes (Signed)
 Lori Gates  ZOX:[id number] DOB: Oct 21, 1955 DOA: 09/24/2023 PCP: Default, Provider, MD    Brief Narrative:  68 year old with uncontrolled DM2, CKD stage IV, HTN, diastolic CHF, and cocaine abuse who was admitted to the hospital 4/29 after she was found down on the floor unresponsive and seizing and ultimately diagnosed with DKA with seizure activity.  She required endotracheal intubation upon arrival to the ER, and was admitted to the ICU.  CT head was negative for acute bleeding.  MRI brain was negative for acute process.  After correction of her DKA she was able to be extubated but suffered with stridor post-extubation.  ENT was consulted and the patient was found to have a left vocal cord polyp.  She was taken to the OR by ENT with excision of the polyp and required approximately 12 hours of rest on the ventilator postoperatively before she could be re-extubated.  Significant Events: 4/29 found unconscious and seizing on floor -intubated in ER -admit to ICU in DKA 5/1 EEG negative for ongoing seizures -temporary HD cath placed 5/2 HD performed 5/4 extubated -postextubation developed stridor 5/6 ongoing stridor without respiratory distress -ENT performed fiberoptic laryngoscopy showing right vocal fold paralysis and large hemorrhagic polyp on the left vocal fold  5/7 2 OR for left vocal fold polyp excision -remained intubated postop due to low tidal volumes requiring additional night in ICU 5/9 transferred to [facility]  Goals of Care:   Code Status: Full Code   DVT prophylaxis: heparin  injection 5,000 Units Start: 09/25/23 0600   Interim Hx: No acute events recorded overnight.  Afebrile.  Blood pressure elevated with systolics 157-195.  Alert and conversant but mildly confused today.  Denies specific complaints.  Assessment & Plan:  Uncontrolled DM2 - DKA with coma A1c 14 - DKA resolved - CBG now well-controlled with adjustments made in insulin  regimen during this admission. Glucoses  are still elevated with values running from 207 to 312 in the past 24 hours. Will increase lantus  to 25 units daily.   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 Status post polypectomy by ENT 5/7 - ENT to consider repeat flexible scope exam to re-evaluate her right vocal cord - if her vocal cord remains inoperable she will need a contrasted CT of the neck and chest to evaluate her recurrent laryngeal nerve - presently it is not felt wise to administer IV contrast while renal function is recovering  Metabolic encephalopathy due to DKA and toxic encephalopathy due to cocaine Mental status slow to improve -TSH elevated but free T4 normal - B12 is normal -folic acid is low at 5.5 -making slow progress with stabilization of mental status -anticipate this will improve further with increasing activity and rehab  Folic acid deficiency Likely simply nutritional in setting of substance abuse - supplementing  Seizure with postictal state Incited by DKA - continue Keppra   Acute kidney injury on CKD stage IV Required HD during initial portion of hospital stay - Nephrology has assisted in her care- baseline creatinine approximately 2.6 - HD cath removed 5/10 - creatinine holding steady at baseline presently  Recent Labs  Lab 10/03/23 0500 10/04/23 0442 10/05/23 0559 10/06/23 8119 10/08/23 1478  CREATININE 2.32*  2.36* 2.41* 2.40* 2.24* 2.41*    Normocytic anemia Likely simple anemia of chronic kidney disease - hemoglobin able  Paroxysmal atrial fibrillation NSR at present   Uncontrolled HTN Adjust medical therapy today and follow  trend  HLD Continue Lipitor   Chronic diastolic CHF Appears euvolemic at present  Cocaine abuse UDS positive for cocaine at admission -patient has been counseled that she absolutely must discontinue cocaine use permanently  Family Communication: No family present at time of exam Disposition:  CIR -awaiting bed availability for discharge   Objective: Blood pressure (!) 145/68, pulse 72, temperature 97.6 F (36.4 C), temperature source Oral, resp. rate (!) 22, height 5\' 5"  (1.651 m), weight 62.4 kg, SpO2 100%.  Intake/Output Summary (Last 24 hours) at 10/09/2023 1743 Last data filed at 10/09/2023 1700 Gross per 24 hour  Intake 826 ml  Output 1 ml  Net 825 ml   Filed Weights   10/04/23 0412 10/08/23 0423 10/09/23 0437  Weight: 61 kg 64.5 kg 62.4 kg    Exam:  Constitutional:  The patient is awake, alert, and oriented x 3. No acute distress. Respiratory:  No increased work of breathing. No wheezes, rales, or rhonchi No tactile fremitus Cardiovascular:  Regular rate and rhythm No murmurs, ectopy, or gallups. No lateral PMI. No thrills. Abdomen:  Abdomen is soft, non-tender, non-distended No hernias, masses, or organomegaly Normoactive bowel sounds.  Musculoskeletal:  No cyanosis, clubbing, or edema Skin:  No rashes, lesions, ulcers palpation of skin: no induration or nodules Neurologic:  CN 2-12 intact Sensation all 4 extremities intact Psychiatric:  Mental status Mood, affect appropriate Orientation to person, place, time  judgment and insight appear intact   CBC: Recent Labs  Lab 10/05/23 0559 10/06/23 0822 10/08/23 0634  WBC 7.3 6.2 8.4  HGB 8.3* 8.4* 8.1*  HCT 26.6* 26.5* 25.3*  MCV 99.3 97.4 95.8  PLT 306 277 303   Basic Metabolic Panel: Recent Labs  Lab 10/04/23 0442 10/05/23 0559 10/06/23 0822 10/08/23 0634  NA 143 145 144 142  K 4.1 4.1 4.0 4.2  CL 108 107 110 110  CO2 26 27 25 27   GLUCOSE 253* 319* 161* 146*  BUN 36* 38* 37* 49*   CREATININE 2.41* 2.40* 2.24* 2.41*  CALCIUM  8.2* 8.5* 8.1* 7.9*  PHOS 3.3 3.7 3.8  --    GFR: Estimated Creatinine Clearance: 20.1 mL/min (A) (by C-G formula based on SCr of 2.41 mg/dL (H)).   Scheduled Meds:  atorvastatin   80 mg Oral Daily   Chlorhexidine  Gluconate Cloth  6 each Topical Daily   docusate sodium   100 mg Oral BID   folic acid  1 mg Oral Daily   heparin   5,000 Units Subcutaneous Q8H   insulin  aspart  0-20 Units Subcutaneous TID WC   insulin  aspart  0-5 Units Subcutaneous QHS   insulin  glargine-yfgn  20 Units Subcutaneous Daily   isosorbide-hydrALAZINE   1 tablet Oral TID   levETIRAcetam   500 mg Oral BID   [START ON 10/10/2023] nebivolol  7.5 mg Oral Daily   mouth rinse  15 mL Mouth Rinse 4 times per day   pantoprazole   40 mg Oral Daily   polyethylene glycol  17 g Oral Daily   QUEtiapine  12.5 mg Oral QHS   sodium chloride  flush  10-40 mL Intracatheter Q12H   thiamine   100 mg Oral Daily   torsemide   20 mg Oral Daily      LOS: 15 days   Destry Bezdek, DO [facility] Pager - Text Page per Tilford Foley  If 7PM-7AM, please contact night-coverage per Amion 10/09/2023, 5:43 PM

## 2023-10-09 NOTE — TOC Progression Note (Signed)
 Transition of Care [facility]) - Progression Note    Patient Details  Name: Lori Gates MRN: [id number] Date of Birth: [birth year]  [facility] CM/SW Contact  Rosealyn Little A [place], LCSW Phone Number: 10/09/2023, 3:48 PM  Clinical Narrative:     CSW contacted pt's granddaughter, [name] and she put CSW in contact with pt's grandson, [name]. CSW spoke with both family members and confirmed interest for SNF at discharge versus CIR. SNF workup completed, pt faxed out. Requested [place], [place] area. Bed offers pending.   TOC will continue to follow.    Expected Discharge Plan: IP Rehab Facility Barriers to Discharge: Continued Medical Work up  Expected Discharge Plan and Services                                               Social Determinants of Health (SDOH) Interventions SDOH Screenings   Food Insecurity: Patient Unable To Answer (09/25/2023)  Housing: Unknown (09/25/2023)  Transportation Needs: Patient Unable To Answer (09/25/2023)  Utilities: Patient Unable To Answer (09/25/2023)  Social Connections: Patient Unable To Answer (09/25/2023)  Tobacco Use: Medium Risk (09/24/2023)    Readmission Risk Interventions     No data to display

## 2023-10-09 NOTE — NC FL2 (Signed)
 [place]  MEDICAID FL2 LEVEL OF CARE FORM     IDENTIFICATION  Patient Name: Lori Gates Birthdate: 1956/04/12 Sex: female Admission Date (Current Location): 09/24/2023  [facility] and [place] Number:  Producer, television/film/video and Address:  The . [facility], [address], [place], [place] [address]      Provider Number: [id number]  Attending Physician Name and Address:  Junita Oliva, DO  Relative Name and Phone Number:  Lionell Riddles)  [phone]    Current Level of Care: Hospital Recommended Level of Care: Skilled Nursing Facility Prior Approval Number:    Date Approved/Denied:   PASRR Number: [id number]  Discharge Plan: SNF    Current Diagnoses: Patient Active Problem List   Diagnosis Date Noted   Stridor 10/01/2023   Vocal cord paralysis 10/01/2023   Vocal cord polyp 10/01/2023   Protein-calorie malnutrition, severe 09/27/2023   Acute hypercapnic respiratory failure (HCC) 09/26/2023   Acute encephalopathy 09/25/2023   Seizure (HCC) 09/24/2023    Orientation RESPIRATION BLADDER Height & Weight     Self, Time, Situation, Place  Normal Incontinent, External catheter Weight: 137 lb 9.1 oz (62.4 kg) Height:  5\' 5"  (165.1 cm)  BEHAVIORAL SYMPTOMS/MOOD NEUROLOGICAL BOWEL NUTRITION STATUS    Convulsions/Seizures Incontinent Diet (see DC summary)  AMBULATORY STATUS COMMUNICATION OF NEEDS Skin   Extensive Assist Verbally Other (Comment) (Sacrum Medial Stage 2 - Partial thickness loss of dermis presenting as a shallow open injury with a red, pink wound bed without slough)                       Personal Care Assistance Level of Assistance  Bathing, Feeding, Dressing Bathing Assistance: Maximum assistance Feeding assistance: Limited assistance Dressing Assistance: Maximum assistance     Functional Limitations Info  Sight, Hearing, Speech Sight Info: Impaired (wears glasses) Hearing Info: Adequate Speech Info: Impaired (vocal  chord polyp)    SPECIAL CARE FACTORS FREQUENCY  PT (By licensed PT), OT (By licensed OT)     PT Frequency: 5x/week OT Frequency: 5x/week            Contractures Contractures Info: Not present    Additional Factors Info  Code Status, Allergies Code Status Info: FULL Allergies Info: NKA           Current Medications (10/09/2023):  This is the current hospital active medication list Current Facility-Administered Medications  Medication Dose Route Frequency Provider Last Rate Last Admin   acetaminophen  (TYLENOL ) tablet 650 mg  650 mg Oral Q4H PRN Young Hensen, RPH       atorvastatin  (LIPITOR ) tablet 80 mg  80 mg Oral Daily Young Hensen, RPH   80 mg at 10/09/23 1022   Chlorhexidine  Gluconate Cloth 2 % PADS 6 each  6 each Topical Daily Desai, Nikita S, MD   6 each at 10/09/23 0245   docusate sodium  (COLACE) capsule 100 mg  100 mg Oral BID Young Hensen, RPH   100 mg at 10/05/23 2152   docusate sodium  (COLACE) capsule 100 mg  100 mg Oral BID PRN Young Hensen, RPH       folic acid (FOLVITE) tablet 1 mg  1 mg Oral Daily Elvera Hamilton T, MD   1 mg at 10/09/23 1022   heparin  injection 5,000 Units  5,000 Units Subcutaneous Q8H Payne, John D, PA-C   5,000 Units at 10/09/23 1514   insulin  aspart (novoLOG ) injection 0-20 Units  0-20 Units Subcutaneous TID WC Abbe Abate, MD  15 Units at 10/09/23 1232   insulin  aspart (novoLOG ) injection 0-5 Units  0-5 Units Subcutaneous QHS Abbe Abate, MD   2 Units at 10/08/23 2142   insulin  glargine-yfgn (SEMGLEE ) injection 20 Units  20 Units Subcutaneous Daily Abbe Abate, MD   20 Units at 10/09/23 1021   isosorbide-hydrALAZINE  (BIDIL) 20-37.5 MG per tablet 1 tablet  1 tablet Oral TID Abbe Abate, MD   1 tablet at 10/09/23 1022   levalbuterol  (XOPENEX ) nebulizer solution 0.63 mg  0.63 mg Nebulization Q6H PRN Abbe Abate, MD       levETIRAcetam  (KEPPRA ) tablet 500 mg  500 mg Oral BID Young Hensen, RPH   500 mg at  10/09/23 1022   [START ON 10/10/2023] nebivolol (BYSTOLIC) tablet 7.5 mg  7.5 mg Oral Daily Swayze, Ava, DO       Oral care mouth rinse  15 mL Mouth Rinse 4 times per day Chand, Sudham, MD   15 mL at 10/09/23 1200   Oral care mouth rinse  15 mL Mouth Rinse PRN Chand, Sudham, MD       pantoprazole  (PROTONIX ) EC tablet 40 mg  40 mg Oral Daily Elvera Hamilton T, MD   40 mg at 10/09/23 1022   polyethylene glycol (MIRALAX  / GLYCOLAX ) packet 17 g  17 g Oral Daily Young Hensen, RPH   17 g at 10/05/23 1121   polyethylene glycol (MIRALAX  / GLYCOLAX ) packet 17 g  17 g Oral Daily PRN Young Hensen, RPH       QUEtiapine (SEROQUEL) tablet 12.5 mg  12.5 mg Oral QHS Elvera Hamilton T, MD   12.5 mg at 10/08/23 2148   sodium chloride  flush (NS) 0.9 % injection 10-40 mL  10-40 mL Intracatheter Q12H Patrick Boor, MD   10 mL at 10/09/23 1023   sodium chloride  flush (NS) 0.9 % injection 10-40 mL  10-40 mL Intracatheter PRN Patrick Boor, MD       thiamine  (VITAMIN B1) tablet 100 mg  100 mg Oral Daily Young Hensen, RPH   100 mg at 10/09/23 1022   torsemide  (DEMADEX ) tablet 20 mg  20 mg Oral Daily Abbe Abate, MD   20 mg at 10/09/23 1022     Discharge Medications: Please see discharge summary for a list of discharge medications.  Relevant Imaging Results:  Relevant Lab Results:   Additional Information [id number]  Lori Sebree A [place], LCSW

## 2023-10-09 NOTE — Plan of Care (Signed)
  Problem: Education: Goal: Knowledge of General Education information will improve Description: Including pain rating scale, medication(s)/side effects and non-pharmacologic comfort measures Outcome: Progressing   Problem: Health Behavior/Discharge Planning: Goal: Ability to manage health-related needs will improve Outcome: Progressing   Problem: Clinical Measurements: Goal: Ability to maintain clinical measurements within normal limits will improve Outcome: Progressing Goal: Will remain free from infection Outcome: Progressing Goal: Diagnostic test results will improve Outcome: Progressing Goal: Respiratory complications will improve Outcome: Progressing Goal: Cardiovascular complication will be avoided Outcome: Progressing   Problem: Activity: Goal: Risk for activity intolerance will decrease Outcome: Progressing   Problem: Nutrition: Goal: Adequate nutrition will be maintained Outcome: Progressing   Problem: Coping: Goal: Level of anxiety will decrease Outcome: Progressing   Problem: Elimination: Goal: Will not experience complications related to bowel motility Outcome: Progressing Goal: Will not experience complications related to urinary retention Outcome: Progressing   Problem: Pain Managment: Goal: General experience of comfort will improve and/or be controlled Outcome: Progressing   Problem: Safety: Goal: Ability to remain free from injury will improve Outcome: Progressing   Problem: Skin Integrity: Goal: Risk for impaired skin integrity will decrease Outcome: Progressing   Problem: Education: Goal: Ability to describe self-care measures that may prevent or decrease complications (Diabetes Survival Skills Education) will improve Outcome: Progressing Goal: Individualized Educational Video(s) Outcome: Progressing   Problem: Coping: Goal: Ability to adjust to condition or change in health will improve Outcome: Progressing   Problem: Fluid  Volume: Goal: Ability to maintain a balanced intake and output will improve Outcome: Progressing   Problem: Health Behavior/Discharge Planning: Goal: Ability to identify and utilize available resources and services will improve Outcome: Progressing Goal: Ability to manage health-related needs will improve Outcome: Progressing   Problem: Metabolic: Goal: Ability to maintain appropriate glucose levels will improve Outcome: Progressing   Problem: Nutritional: Goal: Maintenance of adequate nutrition will improve Outcome: Progressing Goal: Progress toward achieving an optimal weight will improve Outcome: Progressing   Problem: Skin Integrity: Goal: Risk for impaired skin integrity will decrease Outcome: Progressing   Problem: Tissue Perfusion: Goal: Adequacy of tissue perfusion will improve Outcome: Progressing   Problem: Activity: Goal: Ability to tolerate increased activity will improve Outcome: Progressing   Problem: Respiratory: Goal: Ability to maintain a clear airway and adequate ventilation will improve Outcome: Progressing   Problem: Role Relationship: Goal: Method of communication will improve Outcome: Progressing   Problem: Education: Goal: Ability to describe self-care measures that may prevent or decrease complications (Diabetes Survival Skills Education) will improve Outcome: Progressing Goal: Individualized Educational Video(s) Outcome: Progressing   Problem: Cardiac: Goal: Ability to maintain an adequate cardiac output will improve Outcome: Progressing   Problem: Health Behavior/Discharge Planning: Goal: Ability to identify and utilize available resources and services will improve Outcome: Progressing Goal: Ability to manage health-related needs will improve Outcome: Progressing   Problem: Fluid Volume: Goal: Ability to achieve a balanced intake and output will improve Outcome: Progressing   Problem: Metabolic: Goal: Ability to maintain  appropriate glucose levels will improve Outcome: Progressing   Problem: Nutritional: Goal: Maintenance of adequate nutrition will improve Outcome: Progressing Goal: Maintenance of adequate weight for body size and type will improve Outcome: Progressing   Problem: Respiratory: Goal: Will regain and/or maintain adequate ventilation Outcome: Progressing   Problem: Urinary Elimination: Goal: Ability to achieve and maintain adequate renal perfusion and functioning will improve Outcome: Progressing

## 2023-10-10 ENCOUNTER — Inpatient Hospital Stay (HOSPITAL_COMMUNITY): Payer: Medicare (Managed Care)

## 2023-10-10 DIAGNOSIS — R569 Unspecified convulsions: Secondary | ICD-10-CM | POA: Diagnosis not present

## 2023-10-10 LAB — BASIC METABOLIC PANEL WITH GFR
Anion gap: 9 (ref 5–15)
BUN: 51 mg/dL — ABNORMAL HIGH (ref 8–23)
CO2: 26 mmol/L (ref 22–32)
Calcium: 8.2 mg/dL — ABNORMAL LOW (ref 8.9–10.3)
Chloride: 108 mmol/L (ref 98–111)
Creatinine, Ser: 2.24 mg/dL — ABNORMAL HIGH (ref 0.44–1.00)
GFR, Estimated: 23 mL/min — ABNORMAL LOW (ref >60)
Glucose, Bld: 59 mg/dL — ABNORMAL LOW (ref 70–99)
Potassium: 4.1 mmol/L (ref 3.5–5.1)
Sodium: 143 mmol/L (ref 135–145)

## 2023-10-10 LAB — CBC WITH DIFFERENTIAL/PLATELET
Abs Immature Granulocytes: 0.02 10*3/uL (ref 0.00–0.07)
Basophils Absolute: 0.1 10*3/uL (ref 0.0–0.1)
Basophils Relative: 1 %
Eosinophils Absolute: 0.2 10*3/uL (ref 0.0–0.5)
Eosinophils Relative: 3 %
HCT: 25.4 % — ABNORMAL LOW (ref 36.0–46.0)
Hemoglobin: 8.1 g/dL — ABNORMAL LOW (ref 12.0–15.0)
Immature Granulocytes: 0 %
Lymphocytes Relative: 30 %
Lymphs Abs: 2.4 10*3/uL (ref 0.7–4.0)
MCH: 30.8 pg (ref 26.0–34.0)
MCHC: 31.9 g/dL (ref 30.0–36.0)
MCV: 96.6 fL (ref 80.0–100.0)
Monocytes Absolute: 0.6 10*3/uL (ref 0.1–1.0)
Monocytes Relative: 8 %
Neutro Abs: 4.6 10*3/uL (ref 1.7–7.7)
Neutrophils Relative %: 58 %
Platelets: 300 10*3/uL (ref 150–400)
RBC: 2.63 MIL/uL — ABNORMAL LOW (ref 3.87–5.11)
RDW: 12.9 % (ref 11.5–15.5)
WBC: 8 10*3/uL (ref 4.0–10.5)
nRBC: 0 % (ref 0.0–0.2)

## 2023-10-10 LAB — GLUCOSE, CAPILLARY
Glucose-Capillary: 107 mg/dL — ABNORMAL HIGH (ref 70–99)
Glucose-Capillary: 245 mg/dL — ABNORMAL HIGH (ref 70–99)
Glucose-Capillary: 188 mg/dL — ABNORMAL HIGH (ref 70–99)
Glucose-Capillary: 90 mg/dL (ref 70–99)

## 2023-10-10 MED ORDER — GLUCERNA SHAKE PO LIQD
237.0000 mL | Freq: Three times a day (TID) | ORAL | Status: DC
  Administered 2023-10-10 – 2023-10-19 (×21): 237 mL via ORAL

## 2023-10-10 MED ORDER — ADULT MULTIVITAMIN W/MINERALS CH
1.0000 | ORAL_TABLET | Freq: Every day | ORAL | Status: DC
  Administered 2023-10-10 – 2023-10-19 (×10): 1 via ORAL
  Filled 2023-10-10 (×10): qty 1

## 2023-10-10 NOTE — Progress Notes (Addendum)
 Nutrition Follow-up  DOCUMENTATION CODES:   Severe malnutrition in context of chronic illness  INTERVENTION:   Continue diet recommendation per SLP; currently on dysphagia 3, Honey thick liquids Double proteins with meals  Glucerna Shake po TID, each supplement provides 220 kcal and 10 grams of protein Magic cup TID with meals, each supplement provides 290 kcal and 9 grams of protein MVI with minerals daily  Continue thiamine  and folic acid daily   NUTRITION DIAGNOSIS:   Severe Malnutrition related to chronic illness (uncontrolled DM, substance abuse) as evidenced by severe fat depletion, severe muscle depletion.  - Still applicable   GOAL:   Patient will meet greater than or equal to 90% of their needs  - Progressing   MONITOR:   Vent status, TF tolerance  REASON FOR ASSESSMENT:   Consult Enteral/tube feeding initiation and management  ASSESSMENT:   Pt with PMH of CKD stage IV, uncontrolled IDDM, HTN, HLD, smoking, cocaine abuse (reports stopped March 2024), CHF admitted from home for seizures. UDS positive for cocaine and benzos.  4/29 - admit from home with seizures, intubated  5/1 - nutrition initiated, HD initiated 5/4 - extubated, BSE NPO 5/5 - cortrak placed, BSE NPO, transferred out of ICU 5/7 -  s/p excision of L vocal cord polyp/vocal fold stripping; pt self removed Cortrak  5/8 - MBS - diet advanced to dysphagia 3, nectar thick liquids 5/15: MBS - Dysphagia 3, liquids changed to honey thick   Pt seen and examined at bedside. Was lethargic and slightly confused today. Spoke to NT who reports she can be confused but when food is around she eats 100%. Has good appetite. No nausea/vomiting noted. BM last night. MBS done yesterday and liquids downgraded to honey thick. Unable to have mighty shakes, will switch to Glucerna that nursing can thicken. CIR following. CBG's have been high, diabetes coordinator following.   Admit weight: 60 kg Current weight: 66.3 kg    Average Meal Intake: 5/11: 100% x 3 meals 5/12: 100% x 1 meal 5/14: 80% x 3 meals  Nutritionally Relevant Medications: Scheduled Meds:  folic acid  1 mg Oral Daily   heparin   5,000 Units Subcutaneous Q8H   insulin  aspart  0-20 Units Subcutaneous TID WC   insulin  aspart  0-5 Units Subcutaneous QHS   insulin  glargine-yfgn  25 Units Subcutaneous Daily   isosorbide-hydrALAZINE   1 tablet Oral TID   levETIRAcetam   500 mg Oral BID   multivitamin with minerals  1 tablet Oral Daily   thiamine   100 mg Oral Daily   torsemide   20 mg Oral Daily   Labs Reviewed: BUN 51, Creatinine 2.24, Calcium  8.2 GFR 23,  CBG ranges from 107-312 mg/dL over the last 24 hours HgbA1c 14.3  Diet Order:   Diet Order             DIET DYS 3 Fluid consistency: Honey Thick  Diet effective now                   EDUCATION NEEDS:   Not appropriate for education at this time  Skin:  Skin Assessment: Skin Integrity Issues: Skin Integrity Issues:: Stage II Stage II: medial sacrum  Last BM:  5/14 - type 6  Height:   Ht Readings from Last 1 Encounters:  10/02/23 5\' 5"  (1.651 m)    Weight:   Wt Readings from Last 1 Encounters:  10/10/23 66.3 kg    Ideal Body Weight:  56.8 kg  BMI:  Body mass index is  24.32 kg/m.  Estimated Nutritional Needs:   Kcal:  1600-1800  Protein:  90-100 grams  Fluid:  1.2 L/day   Frederik Jansky, RD Registered Dietitian  See Amion for more information

## 2023-10-10 NOTE — TOC Progression Note (Signed)
 Transition of Care [facility]) - Progression Note    Patient Details  Name: Lori Gates MRN: [id number] Date of Birth: 1955/07/17  Transition [facility] CM/SW Contact  Janoah Menna A [place], LCSW Phone Number: 10/10/2023, 10:33 AM  Clinical Narrative:     CSW contacted pt's grandson, Da Baldo Bonds to provide bed offers with Medicare.gov ratings. He stated he  would discuss with pt and family and make a decision on a facility and let CSW know. CSW contact information provided.   TOC will continue to follow.    Expected Discharge Plan: IP Rehab Facility Barriers to Discharge: Continued Medical Work up  Expected Discharge Plan and Services                                               Social Determinants of Health (SDOH) Interventions SDOH Screenings   Food Insecurity: Patient Unable To Answer (09/25/2023)  Housing: Unknown (09/25/2023)  Transportation Needs: Patient Unable To Answer (09/25/2023)  Utilities: Patient Unable To Answer (09/25/2023)  Social Connections: Patient Unable To Answer (09/25/2023)  Tobacco Use: Medium Risk (09/24/2023)    Readmission Risk Interventions     No data to display

## 2023-10-10 NOTE — Progress Notes (Signed)
 Occupational Therapy Treatment Patient Details Name: Lori Gates MRN: [id number] DOB: 05-10-1956 Today's Date: 10/10/2023   History of present illness 68 y.o. female admitted 09/24/23 after being found down, unresponsive, seizure and hyperglycemia; UDS (+) cocaine, benzos. Workup for DKA, seizures, UTI, sepsis, aspiration PNA vs pneumonitis. 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 ETT 4/29-5/2. iHD initiated 5/1. S/p microlaryngoscopy with excision of L true vocal cord polyp 5/7; ETT 5/7-5/8. PMH includes asthma, DM2, HTN.   OT comments  Pt making slow progress with functional goals. Pt agreeable to in room activity to walk  with RW to commode for toileting tasks and to stand at sink for grooming and hygiene tasks. Pt declined to stay in chair at end of session and insisted on returning to bed. OT will continue to follow acutely to maximize level of function and safety      If plan is discharge home, recommend the following:  Assistance with feeding;Assistance with cooking/housework;Direct supervision/assist for medications management;Direct supervision/assist for financial management;Assist for transportation;Help with stairs or ramp for entrance;Supervision due to cognitive status;A lot of help with bathing/dressing/bathroom;A lot of help with walking and/or transfers   Equipment Recommendations  BSC/3in1    Recommendations for Other Services      Precautions / Restrictions Precautions Precautions: Fall;Other (comment) Recall of Precautions/Restrictions: Impaired Restrictions Weight Bearing Restrictions Per Provider Order: No       Mobility Bed Mobility Overal bed mobility: Needs Assistance Bed Mobility: Sit to Supine       Sit to supine: Min assist   General bed mobility comments: Pt sitting EOB upon arrival, min A with LEs back into bed    Transfers Overall transfer level: Needs assistance Equipment used:  Rolling walker (2 wheels) Transfers: Sit to/from Stand Sit to Stand: Mod assist, Min assist     Step pivot transfers: Min assist, Mod assist     General transfer comment: mod A sit-stand form EOB, min A from commode     Balance Overall balance assessment: Needs assistance Sitting-balance support: No upper extremity supported, Feet unsupported Sitting balance-Leahy Scale: Fair     Standing balance support: Bilateral upper extremity supported, During functional activity Standing balance-Leahy Scale: Poor                             ADL either performed or assessed with clinical judgement   ADL Overall ADL's : Needs assistance/impaired     Grooming: Wash/dry hands;Wash/dry face;Contact guard assist;Standing                   Statistician: Moderate assistance;Minimal assistance;Ambulation;Rolling walker (2 wheels);BSC/3in1;Cueing for safety   Toileting- Clothing Manipulation and Hygiene: Maximal assistance;Sit to/from stand Toileting - Clothing Manipulation Details (indicate cue type and reason): posterior pericare in standing     Functional mobility during ADLs: Moderate assistance;Minimal assistance;Rolling walker (2 wheels)      Extremity/Trunk Assessment Upper Extremity Assessment Upper Extremity Assessment: Generalized weakness   Lower Extremity Assessment Lower Extremity Assessment: Defer to PT evaluation   Cervical / Trunk Assessment Cervical / Trunk Assessment: Kyphotic    Vision Ability to See in Adequate Light: 0 Adequate Patient Visual Report: No change from baseline     Perception     Praxis     Communication Communication Communication: No apparent difficulties Factors Affecting Communication: Reduced clarity of speech;Difficulty expressing self   Cognition Arousal: Alert Behavior During Therapy: Flat affect  Executive functioning impairment (select all impairments): Reasoning, Problem solving                    Following commands: Impaired Following commands impaired: Follows one step commands inconsistently, Follows one step commands with increased time      Cueing   Cueing Techniques: Verbal cues, Gestural cues, Tactile cues  Exercises      Shoulder Instructions       General Comments      Pertinent Vitals/ Pain       Pain Assessment Pain Assessment: No/denies pain Pain Score: 0-No pain Pain Intervention(s): Monitored during session, Repositioned  Home Living                                          Prior Functioning/Environment              Frequency  Min 2X/week        Progress Toward Goals  OT Goals(current goals can now be found in the care plan section)  Progress towards OT goals: Progressing toward goals     Plan      Co-evaluation                 AM-PAC OT "6 Clicks" Daily Activity     Outcome Measure   Help from another person eating meals?: None Help from another person taking care of personal grooming?: A Little Help from another person toileting, which includes using toliet, bedpan, or urinal?: A Lot Help from another person bathing (including washing, rinsing, drying)?: A Lot Help from another person to put on and taking off regular upper body clothing?: A Lot Help from another person to put on and taking off regular lower body clothing?: A Lot 6 Click Score: 15    End of Session Equipment Utilized During Treatment: Gait belt;Rolling walker (2 wheels);Other (comment) (BSC)  OT Visit Diagnosis: Unsteadiness on feet (R26.81);Muscle weakness (generalized) (M62.81);History of falling (Z91.81);Other symptoms and signs involving cognitive function;Other abnormalities of gait and mobility (R26.89)   Activity Tolerance Patient tolerated treatment well   Patient Left with call bell/phone within reach;in bed;with bed alarm set   Nurse Communication Mobility status        Time: 1002-1020 OT Time Calculation  (min): 18 min  Charges: OT General Charges $OT Visit: 1 Visit OT Treatments $Self Care/Home Management : 8-22 mins    Fonda Hymen [facility] 10/10/2023, 2:06 PM

## 2023-10-10 NOTE — Progress Notes (Addendum)
 Physical Therapy Treatment Patient Details Name: Lori Gates MRN: [id number] DOB: 1956-05-12 Today's Date: 10/10/2023   History of Present Illness 68 y.o. female admitted 09/24/23 after being found down, unresponsive, seizure and hyperglycemia; UDS (+) cocaine, benzos. Workup for DKA, seizures, UTI, sepsis, aspiration PNA vs pneumonitis. 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 ETT 4/29-5/2. iHD initiated 5/1. S/p microlaryngoscopy with excision of L true vocal cord polyp 5/7; ETT 5/7-5/8. PMH includes asthma, DM2, HTN.    PT Comments  Pt received in supine, agreeable to therapy session with encouragement. Pt bed noted to be damp due to urine and gown wet, pt had not notified staff but call bell also not well connected in wall; call bell adjusted. Pt assisted to pivot from bed to chair and back with up to +2 modA and RW support. Pt had difficulty sequencing motor tasks and needing dense repetitive cues to perform all tasks. Pt c/o feeling unwell sitting up and was very restless while sitting in chair, VSS. Pt bed linens changed and hygiene assist provided while pt sitting up and standing, and pt semi-sidelying toward her R once returned to supine with heels floated for pressure relief. Pt may benefit from Prevalon boots to protect her heels as she is not currently able to relieve her own pressure without staff assist. RN notified of pt request for food and apparent skin breakdown/pressure sore on her bottom. Patient will benefit from continued inpatient follow up therapy, <3 hours/day, discussed update for disposition with pt and supervising PT Orysia Blas as pt not currently able to tolerate >3 hours of therapy daily and per chart review family not able to provide 24/7 physical assist/supervision.      If plan is discharge home, recommend the following: Two people to help with bathing/dressing/bathroom;Assistance with cooking/housework;Direct  supervision/assist for medications management;Direct supervision/assist for financial management;Assist for transportation;Help with stairs or ramp for entrance;Supervision due to cognitive status;Two people to help with walking and/or transfers   Can travel by private vehicle     No  Equipment Recommendations  Other (comment) (TBD - potential walker, wheelchair with pressure cushion, BSC)    Recommendations for Other Services       Precautions / Restrictions Precautions Precautions: Fall;Other (comment) Recall of Precautions/Restrictions: Impaired Precaution/Restrictions Comments: bladder/bowel incontinence, LUE edema Restrictions Weight Bearing Restrictions Per Provider Order: No     Mobility  Bed Mobility Overal bed mobility: Needs Assistance Bed Mobility: Supine to Sit, Sit to Supine     Supine to sit: Min assist, HOB elevated, Used rails Sit to supine: Min assist, +2 for physical assistance   General bed mobility comments: bed linens noted to be wet with urine, bed sheets changed while pt sitting up in recliner; pt impulsive and has seizure precs so pt back in bed with x4 rails up for pt safety due to precs and heels floated, LUE elevated due to edema.    Transfers Overall transfer level: Needs assistance Equipment used: Rolling walker (2 wheels) Transfers: Sit to/from Stand, Bed to chair/wheelchair/BSC Sit to Stand: Mod assist, Min assist, +2 safety/equipment   Step pivot transfers: Mod assist, +2 physical assistance       General transfer comment: EOB> RW with minA to rise and modA to steady while static standing. ModA for pivot toward chair on her R with RW support and back, pt needs dense repetitive cues for stepping and not to sit impulsively    Ambulation/Gait  General Gait Details: pt adamantly declines and PTA defer due to safety concerns with pt appearing to fatigue quickly after step pivot transfers x2   Stairs              Wheelchair Mobility     Tilt Bed    Modified Rankin (Stroke Patients Only) Modified Rankin (Stroke Patients Only) Pre-Morbid Rankin Score: Moderate disability Modified Rankin: Severe disability     Balance Overall balance assessment: Needs assistance Sitting-balance support: No upper extremity supported, Feet unsupported Sitting balance-Leahy Scale: Poor Sitting balance - Comments: anterior instability, needs CGA for safety while sitting EOB, intermittent minA; unclear if due to lethargy/behaviors or due to true weakness   Standing balance support: Bilateral upper extremity supported, Reliant on assistive device for balance Standing balance-Leahy Scale: Poor Standing balance comment: minA static standing with RW, min to modA for dynamic standing tasks                            Communication Communication Communication: Impaired Factors Affecting Communication: Reduced clarity of speech;Difficulty expressing self;Other (comment) (dysarthric)  Cognition Arousal: Alert Behavior During Therapy: Restless, Impulsive, Agitated   PT - Cognitive impairments: No family/caregiver present to determine baseline, Attention, Initiation, Sequencing, Problem solving, Safety/Judgement, Memory Difficult to assess due to: Impaired communication                     PT - Cognition Comments: Difficult to determine true cognitive impairment vs decreased desire to participate. Pt impulsive to lean forward while sitting EOB and once up in recliner, with poor apparent awareness of safety. Pt c/o feeling unwell while sitting up but unable to explain why, RN notified. Unable to assess standing BP as pt impulsive and needing +2 physical assist while standing for safety. Some lability and pt often commenting about wanting food, RN notified as SLP adjusted her diet today, unsure if pt recalls. Following commands: Impaired Following commands impaired: Follows one step commands  inconsistently, Follows one step commands with increased time    Cueing Cueing Techniques: Verbal cues, Gestural cues, Tactile cues  Exercises Other Exercises Other Exercises: pt refusing to attempt    General Comments General comments (skin integrity, edema, etc.): BP 144/71 (93) HR 73 bpm; SpO2 100% on RA reclined in chair; defer standing BP as pt not able to tolerate standing long enough to check; Bed linens were soaked in urine when therapist arrived and pt gown wet but pt had not notified staff. But also call bell observed to not be pressed in to wall panel, call bell fixed and pt bed linens changed and pt up in chair posture in bed at end of session with x2 warm blankets. and LUE elevated on pillow due to edema. Pt totalA for hygiene assist and dressing sitting EOB with variable CGA to minA for seated balance. Pt has a pressure sore on her bottom.      Pertinent Vitals/Pain Pain Assessment Pain Assessment: PAINAD Breathing: occasional labored breathing, short period of hyperventilation Negative Vocalization: repeated troubled calling out, loud moaning/groaning, crying Facial Expression: facial grimacing Body Language: tense, distressed pacing, fidgeting Consolability: distracted or reassured by voice/touch PAINAD Score: 7 Facial Expression: Grimacing Body Movements: Restlessness Muscle Tension: Relaxed Compliance with ventilator (intubated pts.): N/A Vocalization (extubated pts.): Sighing, moaning CPOT Total: 5 Pain Location: not localized, some guarding at abdomen and pt very restless Pain Descriptors / Indicators: Discomfort, Grimacing, Guarding (often leaning forward over her abdomen) Pain  Intervention(s): Limited activity within patient's tolerance, Monitored during session, Repositioned (RN notified)    Home Living                          Prior Function            PT Goals (current goals can now be found in the care plan section) Acute Rehab PT  Goals Patient Stated Goal: to go home PT Goal Formulation: With patient Time For Goal Achievement: 10/14/23 Progress towards PT goals: Progressing toward goals    Frequency    Min 2X/week      PT Plan      Co-evaluation              AM-PAC PT "6 Clicks" Mobility   Outcome Measure  Help needed turning from your back to your side while in a flat bed without using bedrails?: A Little Help needed moving from lying on your back to sitting on the side of a flat bed without using bedrails?: A Lot Help needed moving to and from a bed to a chair (including a wheelchair)?: A Lot Help needed standing up from a chair using your arms (e.g., wheelchair or bedside chair)?: A Lot Help needed to walk in hospital room?: Total Help needed climbing 3-5 steps with a railing? : Total 6 Click Score: 11    End of Session Equipment Utilized During Treatment: Gait belt Activity Tolerance: Patient limited by fatigue;Patient limited by pain Patient left: in bed;with call bell/phone within reach;with bed alarm set;Other (comment) (heels floated; pillow under her L hip to offload) Nurse Communication: Mobility status;Other (comment) (pt discomfort and requesting food; wound on her bottom) PT Visit Diagnosis: Muscle weakness (generalized) (M62.81);Unsteadiness on feet (R26.81);Other abnormalities of gait and mobility (R26.89)     Time: 0454-0981 PT Time Calculation (min) (ACUTE ONLY): 30 min  Charges:    $Therapeutic Activity: 23-37 mins PT General Charges $$ ACUTE PT VISIT: 1 Visit                     Netty Sullivant P., PTA Acute Rehabilitation Services Secure Chat Preferred 9a-5:30pm Office: [phone]    Mariel Shope [facility] 10/10/2023, 4:34 PM

## 2023-10-10 NOTE — Plan of Care (Signed)
  Problem: Education: Goal: Knowledge of General Education information will improve Description: Including pain rating scale, medication(s)/side effects and non-pharmacologic comfort measures Outcome: Progressing   Problem: Health Behavior/Discharge Planning: Goal: Ability to manage health-related needs will improve Outcome: Progressing   Problem: Clinical Measurements: Goal: Ability to maintain clinical measurements within normal limits will improve Outcome: Progressing Goal: Will remain free from infection Outcome: Progressing Goal: Diagnostic test results will improve Outcome: Progressing Goal: Respiratory complications will improve Outcome: Progressing Goal: Cardiovascular complication will be avoided Outcome: Progressing   Problem: Activity: Goal: Risk for activity intolerance will decrease Outcome: Progressing   Problem: Nutrition: Goal: Adequate nutrition will be maintained Outcome: Progressing   Problem: Coping: Goal: Level of anxiety will decrease Outcome: Progressing   Problem: Elimination: Goal: Will not experience complications related to bowel motility Outcome: Progressing Goal: Will not experience complications related to urinary retention Outcome: Progressing   Problem: Pain Managment: Goal: General experience of comfort will improve and/or be controlled Outcome: Progressing   Problem: Safety: Goal: Ability to remain free from injury will improve Outcome: Progressing   Problem: Skin Integrity: Goal: Risk for impaired skin integrity will decrease Outcome: Progressing   Problem: Education: Goal: Ability to describe self-care measures that may prevent or decrease complications (Diabetes Survival Skills Education) will improve Outcome: Progressing Goal: Individualized Educational Video(s) Outcome: Progressing   Problem: Coping: Goal: Ability to adjust to condition or change in health will improve Outcome: Progressing   Problem: Fluid  Volume: Goal: Ability to maintain a balanced intake and output will improve Outcome: Progressing   Problem: Health Behavior/Discharge Planning: Goal: Ability to identify and utilize available resources and services will improve Outcome: Progressing Goal: Ability to manage health-related needs will improve Outcome: Progressing   Problem: Metabolic: Goal: Ability to maintain appropriate glucose levels will improve Outcome: Progressing   Problem: Nutritional: Goal: Maintenance of adequate nutrition will improve Outcome: Progressing Goal: Progress toward achieving an optimal weight will improve Outcome: Progressing   Problem: Skin Integrity: Goal: Risk for impaired skin integrity will decrease Outcome: Progressing   Problem: Tissue Perfusion: Goal: Adequacy of tissue perfusion will improve Outcome: Progressing   Problem: Activity: Goal: Ability to tolerate increased activity will improve Outcome: Progressing   Problem: Respiratory: Goal: Ability to maintain a clear airway and adequate ventilation will improve Outcome: Progressing   Problem: Role Relationship: Goal: Method of communication will improve Outcome: Progressing   Problem: Education: Goal: Ability to describe self-care measures that may prevent or decrease complications (Diabetes Survival Skills Education) will improve Outcome: Progressing Goal: Individualized Educational Video(s) Outcome: Progressing   Problem: Cardiac: Goal: Ability to maintain an adequate cardiac output will improve Outcome: Progressing   Problem: Health Behavior/Discharge Planning: Goal: Ability to identify and utilize available resources and services will improve Outcome: Progressing Goal: Ability to manage health-related needs will improve Outcome: Progressing   Problem: Fluid Volume: Goal: Ability to achieve a balanced intake and output will improve Outcome: Progressing   Problem: Metabolic: Goal: Ability to maintain  appropriate glucose levels will improve Outcome: Progressing   Problem: Nutritional: Goal: Maintenance of adequate nutrition will improve Outcome: Progressing Goal: Maintenance of adequate weight for body size and type will improve Outcome: Progressing   Problem: Respiratory: Goal: Will regain and/or maintain adequate ventilation Outcome: Progressing   Problem: Urinary Elimination: Goal: Ability to achieve and maintain adequate renal perfusion and functioning will improve Outcome: Progressing

## 2023-10-10 NOTE — Inpatient Diabetes Management (Signed)
 Inpatient Diabetes Program Recommendations  AACE/ADA: New Consensus Statement on Inpatient Glycemic Control (2015)  Target Ranges:  Prepandial:   less than 140 mg/dL      Peak postprandial:   less than 180 mg/dL (1-2 hours)      Critically ill patients:  140 - 180 mg/dL   Lab Results  Component Value Date   GLUCAP 245 (H) 10/10/2023   HGBA1C 14.3 (H) 09/25/2023    Latest Reference Range & Units 10/09/23 08:21 10/09/23 12:26 10/09/23 12:41 10/09/23 16:16 10/10/23 09:03 10/10/23 12:00  Glucose-Capillary 70 - 99 mg/dL 161 (H) 096 (H) 045 (H) 207 (H) 107 (H) 245 (H)  (H): Data is abnormally high  Diabetes history: DM 2 Outpatient Diabetes medications: 70/30 listed in addition to Humalog and Lantus , Rybelsus Daily Current orders for Inpatient glycemic control:  Semglee  25 units Daily Novolog  0-20 units tid + hs   Inpatient Diabetes Program Recommendations:    Postprandials remain elevated. Please consider:  Novolog  3 units tid meal coverage if eats 50% meal  Will continue to follow while inpatient.  Thank you, Hays Lipschutz, MSN, CDCES Diabetes Coordinator Inpatient Diabetes Program [phone] (team pager from 8a-5p)

## 2023-10-10 NOTE — Progress Notes (Signed)
 Lori Gates  EAV:[id number] DOB: 14-Jul-1955 DOA: 09/24/2023 PCP: Default, Provider, MD    Brief Narrative:  68 year old with uncontrolled DM2, CKD stage IV, HTN, diastolic CHF, and cocaine abuse who was admitted to the hospital 4/29 after she was found down on the floor unresponsive and seizing and ultimately diagnosed with DKA with seizure activity.  She required endotracheal intubation upon arrival to the ER, and was admitted to the ICU.  CT head was negative for acute bleeding.  MRI brain was negative for acute process.  After correction of her DKA she was able to be extubated but suffered with stridor post-extubation.  ENT was consulted and the patient was found to have a left vocal cord polyp.  She was taken to the OR by ENT with excision of the polyp and required approximately 12 hours of rest on the ventilator postoperatively before she could be re-extubated.  Significant Events: 4/29 found unconscious and seizing on floor -intubated in ER -admit to ICU in DKA 5/1 EEG negative for ongoing seizures -temporary HD cath placed 5/2 HD performed 5/4 extubated -postextubation developed stridor 5/6 ongoing stridor without respiratory distress -ENT performed fiberoptic laryngoscopy showing right vocal fold paralysis and large hemorrhagic polyp on the left vocal fold  5/7 2 OR for left vocal fold polyp excision -remained intubated postop due to low tidal volumes requiring additional night in ICU 5/9 transferred to [facility]  The patient has been evaluated by PT. They have recommended an air mattress and a telesitter so that patient may be sat up in a chair without risk of falling due to her impulsivity. She will also be given an air mattress.  Goals of Care:   Code Status: Full Code   DVT prophylaxis: heparin  injection 5,000 Units Start: 09/25/23 0600   Interim Hx: No acute events recorded overnight.  Afebrile.  Blood pressure elevated with systolics 157-195.  Alert and conversant but mildly  confused today.  Denies specific complaints.  Assessment & Plan:  Uncontrolled DM2 - DKA with coma A1c 14 - DKA resolved - CBG now well-controlled with adjustments made in insulin  regimen during this admission. Glucoses are still elevated with values running from 207 to 312 in the past 24 hours. Will increase lantus  to 25 units daily.   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 Status post polypectomy by ENT 5/7 - ENT to consider repeat flexible scope exam to re-evaluate her right vocal cord - if her vocal cord remains inoperable she will need a contrasted CT of the neck and chest to evaluate her recurrent laryngeal nerve - presently it is not felt wise to administer IV contrast while renal function is recovering  Metabolic encephalopathy due to DKA and toxic encephalopathy due to cocaine Mental status slow to improve -TSH elevated but free T4 normal - B12 is normal -folic acid is low at 5.5 -making slow progress with stabilization of mental status -anticipate this will improve further with increasing activity and rehab  Folic acid deficiency Likely simply nutritional in setting of substance abuse - supplementing  Seizure with postictal state Incited by DKA - continue Keppra   Acute kidney injury on CKD stage IV Required HD during initial  portion of hospital stay - Nephrology has assisted in her care- baseline creatinine approximately 2.6 - HD cath removed 5/10 - creatinine holding steady at baseline presently  Recent Labs  Lab 10/04/23 0442 10/05/23 0559 10/06/23 0822 10/08/23 0634 10/10/23 0712  CREATININE 2.41* 2.40* 2.24* 2.41* 2.24*     Normocytic anemia Likely simple anemia of chronic kidney disease - hemoglobin able  Paroxysmal atrial fibrillation NSR at present   Uncontrolled HTN Adjust medical therapy today and follow trend  HLD Continue Lipitor   Chronic diastolic CHF Appears euvolemic at present  Cocaine abuse UDS positive for cocaine at admission -patient has been counseled that she absolutely must discontinue cocaine use permanently  Family Communication: No family present at time of exam Disposition:  CIR -awaiting bed availability for discharge   Objective: Blood pressure (!) 146/66, pulse 71, temperature 98.1 F (36.7 C), temperature source Oral, resp. rate 20, height 5\' 5"  (1.651 m), weight 66.3 kg, SpO2 97%. No intake or output data in the 24 hours ending 10/10/23 1817  Filed Weights   10/08/23 0423 10/09/23 0437 10/10/23 0554  Weight: 64.5 kg 62.4 kg 66.3 kg    Exam:  Constitutional:  The patient is awake, alert, and oriented x 3. No acute distress. Respiratory:  No increased work of breathing. No wheezes, rales, or rhonchi No tactile fremitus Cardiovascular:  Regular rate and rhythm No murmurs, ectopy, or gallups. No lateral PMI. No thrills. Abdomen:  Abdomen is soft, non-tender, non-distended No hernias, masses, or organomegaly Normoactive bowel sounds.  Musculoskeletal:  No cyanosis, clubbing, or edema Skin:  No rashes, lesions, ulcers palpation of skin: no induration or nodules Neurologic:  CN 2-12 intact Sensation all 4 extremities intact Psychiatric:  Mental status Mood, affect appropriate Orientation to person, place, time  judgment and insight appear intact   CBC: Recent Labs  Lab 10/06/23 0822 10/08/23 0634 10/10/23 0712  WBC 6.2 8.4 8.0  NEUTROABS  --   --  4.6  HGB 8.4* 8.1* 8.1*  HCT 26.5* 25.3* 25.4*  MCV 97.4 95.8 96.6  PLT 277 303 300   Basic Metabolic Panel: Recent Labs  Lab 10/04/23 0442 10/05/23 0559 10/06/23 0822 10/08/23 0634  10/10/23 0712  NA 143 145 144 142 143  K 4.1 4.1 4.0 4.2 4.1  CL 108 107 110 110 108  CO2 26 27 25 27 26   GLUCOSE 253* 319* 161* 146* 59*  BUN 36* 38* 37* 49* 51*  CREATININE 2.41* 2.40* 2.24* 2.41* 2.24*  CALCIUM  8.2* 8.5* 8.1* 7.9* 8.2*  PHOS 3.3 3.7 3.8  --   --    GFR: Estimated Creatinine Clearance: 21.6 mL/min (A) (by C-G formula based on SCr of 2.24 mg/dL (H)).   Scheduled Meds:  atorvastatin   80 mg Oral Daily   Chlorhexidine  Gluconate Cloth  6 each Topical Daily   docusate sodium   100 mg Oral BID   feeding supplement (GLUCERNA SHAKE)  237 mL Oral TID BM   folic acid  1 mg Oral Daily   heparin   5,000 Units Subcutaneous Q8H   insulin  aspart  0-20 Units Subcutaneous TID WC   insulin  aspart  0-5 Units Subcutaneous QHS   insulin  glargine-yfgn  25 Units Subcutaneous Daily   isosorbide-hydrALAZINE   1 tablet Oral TID   levETIRAcetam   500 mg Oral BID   multivitamin with minerals  1 tablet Oral Daily   nebivolol  7.5 mg Oral Daily   mouth rinse  15 mL Mouth Rinse 4 times per day   pantoprazole   40 mg Oral Daily   polyethylene glycol  17 g Oral Daily   QUEtiapine  12.5 mg Oral QHS   sodium chloride  flush  10-40 mL Intracatheter Q12H   thiamine   100 mg Oral Daily   torsemide   20 mg Oral Daily      LOS: 16 days   Jeriko Kowalke, DO [facility] Pager - Text Page per [place]ford Foley  If 7PM-7AM, please contact night-coverage per Amion 10/10/2023, 6:17 PM

## 2023-10-10 NOTE — Procedures (Signed)
 Modified Barium Swallow Study  Patient Details  Name: Lori Gates MRN: [id number] Date of Birth: May 24, 1956  Today's Date: 10/10/2023  Modified Barium Swallow completed.  Full report located under Chart Review in the Imaging Section.  History of Present Illness Lori Gates is a 68 year old female who presented to ED after daughter found on floor actively seizing, CBG read "high". Continued to seize from time of EMS dispatch to arrival to ER, and was intubated for airway protection.  ETT 4/29-5/4.  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 Pt with poorly controlled IDDM, CKD stage IV, HTN, LLD, HFpEF, substance abuse (cocaine) followed by nephrology in [place].   Clinical Impression Pt presents with a moderate oropharyngeal dysphagia c/b delayed swallow intiation, decreased base of tongue retraction, reduced laryngeal elevation, incomplete laryngeal closure, reduced UES opening, and diminished sensation. Pt also has known R VF immobility from ENT evaluation, although phonation is slightly improving.  Silent aspiration was observed with think and nectar/mildly thick liquids.  There was transient penetration of honey/moderately thick liquids.  There was no penetation or aspiration with purees or solid textures.  Today's evaluation marks as slight decline from MBSS on 5/8.  Pt's swallow function was very inconsistent across trials today. This may be related to cognition.  Pt was observed to hold boluses in oral cavity with piecemeal deglutition observed with liquids and solids.  When pt was given cup to drink independently, aspiration  decreased in frequency but still occurred.  All aspiration was silent.  Some instances of aspiration were transient.  Pt was able to clear penetration aspiration with a cued cough in one instance, but could not reliably follow instructions for cough. Amount of pharyngeal residuals was inconsistent as well, and suspect cognition may be playing a roll here as well.  Pt intermittently required cues to swallow to clear residue with liquids.  Given waxing and waning performance, will changes liquid recommendation to honey thick at present.  Pill simulation was not evaluated today.  There was one instance of backflow observed to the pyriform sinuses without penetration/aspiraiton.   Recommend mechanial soft solids with honey thick liquids.  DIGEST Swallow Severity Rating*  Safety: 2  Efficiency: 1  Overall Pharyngeal Swallow Severity: 2 1: mild; 2: moderate; 3: severe; 4: profound  *The Dynamic Imaging Grade of Swallowing Toxicity is standardized for the head and neck cancer population, however, demonstrates promising clinical applications across populations to standardize the clinical rating of pharyngeal swallow safety and severity.   Factors that may increase risk of adverse event in presence of aspiration Roderick Civatte & Jessy Morocco 2021): Reduced cognitive function  Swallow Evaluation Recommendations Recommendations: PO diet PO Diet Recommendation: Dysphagia 3 (Mechanical soft);Moderately thick liquids (Level 3, honey thick) Liquid Administration via: Cup Medication Administration: Crushed with puree Supervision: Intermittent supervision/cueing for swallowing strategies Swallowing strategies  : Slow rate;Small bites/sips Postural changes: Position pt fully upright for meals Oral care recommendations: Oral care BID (2x/day) Caregiver Recommendations: Avoid jello, ice cream, thin soups, popsicles      Elester Grim, MA, CCC-SLP Acute Rehabilitation Services Office: [phone] 10/10/2023,9:17  AM

## 2023-10-11 DIAGNOSIS — R569 Unspecified convulsions: Secondary | ICD-10-CM | POA: Diagnosis not present

## 2023-10-11 LAB — GLUCOSE, CAPILLARY
Glucose-Capillary: 61 mg/dL — ABNORMAL LOW (ref 70–99)
Glucose-Capillary: 65 mg/dL — ABNORMAL LOW (ref 70–99)
Glucose-Capillary: 109 mg/dL — ABNORMAL HIGH (ref 70–99)
Glucose-Capillary: 308 mg/dL — ABNORMAL HIGH (ref 70–99)
Glucose-Capillary: 132 mg/dL — ABNORMAL HIGH (ref 70–99)
Glucose-Capillary: 106 mg/dL — ABNORMAL HIGH (ref 70–99)

## 2023-10-11 MED ORDER — ISOSORB DINITRATE-HYDRALAZINE 20-37.5 MG PO TABS
1.5000 | ORAL_TABLET | Freq: Three times a day (TID) | ORAL | Status: DC
  Administered 2023-10-11 – 2023-10-19 (×26): 1.5 via ORAL
  Filled 2023-10-11 (×26): qty 2

## 2023-10-11 MED ORDER — INSULIN GLARGINE-YFGN 100 UNIT/ML ~~LOC~~ SOLN
20.0000 [IU] | Freq: Every day | SUBCUTANEOUS | Status: DC
  Administered 2023-10-11 – 2023-10-19 (×8): 20 [IU] via SUBCUTANEOUS
  Filled 2023-10-11 (×11): qty 0.2

## 2023-10-11 NOTE — Progress Notes (Signed)
 Mobility Specialist: Progress Note   10/11/23 1256  Mobility  Activity Transferred to/from [facility] : [facility]  Level of Assistance Contact guard assist, steadying assist  Assistive Device Front wheel walker  Activity Response Tolerated well  Mobility Referral Yes  Mobility visit 1 Mobility  Mobility Specialist Start Time (ACUTE ONLY) 0915  Mobility Specialist Stop Time (ACUTE ONLY) 0930  Mobility Specialist Time Calculation (min) (ACUTE ONLY) 15 min    Pt was agreeable to mobility session with encouragement. SV for bed mobility. Cg for STS. Incontinent of bowels in bed and while standing. Pt transferred to [facility] via stand pivot with CG. BM successful, MS assisted with pericare. Pt transferred back to bed and sat EOB. Then requested to use BSC again d/t a sudden void urgency. CG to stand pivot back to [facility]. Left on [facility] with all needs met, call bell in reach.    Deloria Fetch Mobility Specialist Please contact via SecureChat or Rehab office at [phone]

## 2023-10-11 NOTE — Plan of Care (Signed)
  Problem: Education: Goal: Knowledge of General Education information will improve Description: Including pain rating scale, medication(s)/side effects and non-pharmacologic comfort measures Outcome: Progressing   Problem: Health Behavior/Discharge Planning: Goal: Ability to manage health-related needs will improve Outcome: Progressing   Problem: Clinical Measurements: Goal: Ability to maintain clinical measurements within normal limits will improve Outcome: Progressing Goal: Will remain free from infection Outcome: Progressing Goal: Diagnostic test results will improve Outcome: Progressing Goal: Respiratory complications will improve Outcome: Progressing Goal: Cardiovascular complication will be avoided Outcome: Progressing   Problem: Activity: Goal: Risk for activity intolerance will decrease Outcome: Progressing   Problem: Nutrition: Goal: Adequate nutrition will be maintained Outcome: Progressing   Problem: Coping: Goal: Level of anxiety will decrease Outcome: Progressing   Problem: Elimination: Goal: Will not experience complications related to bowel motility Outcome: Progressing Goal: Will not experience complications related to urinary retention Outcome: Progressing   Problem: Pain Managment: Goal: General experience of comfort will improve and/or be controlled Outcome: Progressing   Problem: Safety: Goal: Ability to remain free from injury will improve Outcome: Progressing   Problem: Skin Integrity: Goal: Risk for impaired skin integrity will decrease Outcome: Progressing   Problem: Education: Goal: Ability to describe self-care measures that may prevent or decrease complications (Diabetes Survival Skills Education) will improve Outcome: Progressing Goal: Individualized Educational Video(s) Outcome: Progressing   Problem: Coping: Goal: Ability to adjust to condition or change in health will improve Outcome: Progressing   Problem: Fluid  Volume: Goal: Ability to maintain a balanced intake and output will improve Outcome: Progressing   Problem: Health Behavior/Discharge Planning: Goal: Ability to identify and utilize available resources and services will improve Outcome: Progressing Goal: Ability to manage health-related needs will improve Outcome: Progressing   Problem: Metabolic: Goal: Ability to maintain appropriate glucose levels will improve Outcome: Progressing   Problem: Nutritional: Goal: Maintenance of adequate nutrition will improve Outcome: Progressing Goal: Progress toward achieving an optimal weight will improve Outcome: Progressing   Problem: Skin Integrity: Goal: Risk for impaired skin integrity will decrease Outcome: Progressing   Problem: Tissue Perfusion: Goal: Adequacy of tissue perfusion will improve Outcome: Progressing   Problem: Activity: Goal: Ability to tolerate increased activity will improve Outcome: Progressing   Problem: Respiratory: Goal: Ability to maintain a clear airway and adequate ventilation will improve Outcome: Progressing   Problem: Role Relationship: Goal: Method of communication will improve Outcome: Progressing   Problem: Education: Goal: Ability to describe self-care measures that may prevent or decrease complications (Diabetes Survival Skills Education) will improve Outcome: Progressing Goal: Individualized Educational Video(s) Outcome: Progressing   Problem: Cardiac: Goal: Ability to maintain an adequate cardiac output will improve Outcome: Progressing   Problem: Health Behavior/Discharge Planning: Goal: Ability to identify and utilize available resources and services will improve Outcome: Progressing Goal: Ability to manage health-related needs will improve Outcome: Progressing   Problem: Fluid Volume: Goal: Ability to achieve a balanced intake and output will improve Outcome: Progressing   Problem: Metabolic: Goal: Ability to maintain  appropriate glucose levels will improve Outcome: Progressing   Problem: Nutritional: Goal: Maintenance of adequate nutrition will improve Outcome: Progressing Goal: Maintenance of adequate weight for body size and type will improve Outcome: Progressing   Problem: Respiratory: Goal: Will regain and/or maintain adequate ventilation Outcome: Progressing   Problem: Urinary Elimination: Goal: Ability to achieve and maintain adequate renal perfusion and functioning will improve Outcome: Progressing

## 2023-10-11 NOTE — Progress Notes (Signed)
 Lori Gates  ZOX:[id number] DOB: 1956-04-13 DOA: 09/24/2023 PCP: Default, Provider, MD    Brief Narrative:  68 year old with uncontrolled DM2, CKD stage IV, HTN, diastolic CHF, and cocaine abuse who was admitted to the hospital 4/29 after she was found down on the floor unresponsive and seizing and ultimately diagnosed with DKA with seizure activity.  She required endotracheal intubation upon arrival to the ER, and was admitted to the ICU.  CT head was negative for acute bleeding.  MRI brain was negative for acute process.  After correction of her DKA she was able to be extubated but suffered with stridor post-extubation.  ENT was consulted and the patient was found to have a left vocal cord polyp.  She was taken to the OR by ENT with excision of the polyp and required approximately 12 hours of rest on the ventilator postoperatively before she could be re-extubated.  Significant Events: 4/29 found unconscious and seizing on floor -intubated in ER -admit to ICU in DKA 5/1 EEG negative for ongoing seizures -temporary HD cath placed 5/2 HD performed 5/4 extubated -postextubation developed stridor 5/6 ongoing stridor without respiratory distress -ENT performed fiberoptic laryngoscopy showing right vocal fold paralysis and large hemorrhagic polyp on the left vocal fold  5/7 2 OR for left vocal fold polyp excision -remained intubated postop due to low tidal volumes requiring additional night in ICU 5/9 transferred to [facility]  The patient has been evaluated by PT. They have recommended an air mattress and a telesitter so that patient may be sat up in a chair without risk of falling due to her impulsivity. She will also be given an air mattress.   On 10/11/2023 the patient is sitting up in bed eating lunch. No new complaints.   The patient is medically cleared for discharge pending placement.  Goals of Care:   Code Status: Full Code   DVT prophylaxis: heparin  injection 5,000 Units Start: 09/25/23  0600   Interim Hx: No acute events recorded overnight.  Afebrile.  Blood pressure elevated with systolics 157-195.  Alert and conversant but mildly confused today.  Denies specific complaints.  Assessment & Plan:  Uncontrolled DM2 - DKA with coma A1c 14 - DKA resolved - CBG now well-controlled with adjustments made in insulin  regimen during this admission. Glucoses are still elevated with values running from 61 to 308 in the past 24 hours. Will decrease lantus  to 20 units daily.   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 Status post polypectomy by ENT 5/7 - ENT to consider repeat flexible scope exam to re-evaluate her right vocal cord - if her vocal cord remains inoperable she will need a contrasted CT of the neck and chest to evaluate her recurrent laryngeal nerve - presently it is not felt wise to administer IV contrast while renal function is recovering  Metabolic encephalopathy due to DKA and toxic encephalopathy due to cocaine Mental status slow to improve -TSH elevated but free T4 normal - B12 is normal -folic acid is low at 5.5 -making slow progress with stabilization of mental status -anticipate this will improve further with increasing activity and rehab  Folic acid deficiency Likely simply nutritional in setting of  substance abuse - supplementing  Seizure with postictal state Incited by DKA - continue Keppra   Acute kidney injury on CKD stage IV Required HD during initial portion of hospital stay - Nephrology has assisted in her care- baseline creatinine approximately 2.6 - HD cath removed 5/10 - creatinine holding steady at baseline  presently  Recent Labs  Lab 10/05/23 0559 10/06/23 0822 10/08/23 0634 10/10/23 0712  CREATININE 2.40* 2.24* 2.41* 2.24*    Normocytic anemia Likely simple anemia of chronic kidney disease - hemoglobin able  Paroxysmal atrial fibrillation NSR at present   Uncontrolled HTN Adjust medical therapy today and follow trend  HLD Continue Lipitor   Chronic diastolic CHF Appears euvolemic at present  Cocaine abuse UDS positive for cocaine at admission -patient has been counseled that she absolutely must discontinue cocaine use permanently  Family Communication: No family present at time of exam Disposition:  CIR -awaiting bed availability for discharge   Objective: Blood pressure 137/63, pulse 77, temperature 97.9 F (36.6 C), resp. rate (!) 24, height 5\' 5"  (1.651 m), weight 64.2 kg, SpO2 95%. No intake or output data in the 24 hours ending 10/11/23 1930  Filed Weights   10/09/23 0437 10/10/23 0554 10/11/23 0500  Weight: 62.4 kg 66.3 kg 64.2 kg    Exam:  Constitutional:  The patient is awake, alert, and oriented x 3. No acute distress. Respiratory:  No increased work of breathing. No wheezes, rales, or rhonchi No tactile fremitus Cardiovascular:  Regular rate and rhythm No murmurs, ectopy, or gallups. No lateral PMI. No thrills. Abdomen:  Abdomen is soft, non-tender, non-distended No hernias, masses, or organomegaly Normoactive bowel sounds.  Musculoskeletal:  No cyanosis, clubbing, or edema Skin:  No rashes, lesions, ulcers palpation of skin: no induration or nodules Neurologic:  CN 2-12 intact Sensation all 4 extremities intact Psychiatric:  Mental status Mood, affect appropriate Orientation to person, place, time  judgment and insight appear intact   CBC: Recent Labs  Lab 10/06/23 0822 10/08/23 0634 10/10/23 0712  WBC 6.2 8.4 8.0  NEUTROABS  --   --  4.6  HGB 8.4* 8.1* 8.1*  HCT 26.5* 25.3* 25.4*  MCV 97.4 95.8 96.6  PLT 277 303 300    Basic Metabolic Panel: Recent Labs  Lab 10/05/23 0559 10/06/23 0822 10/08/23 0634 10/10/23 0712  NA 145 144 142 143  K 4.1 4.0 4.2 4.1  CL 107 110 110 108  CO2 27 25 27 26   GLUCOSE 319* 161* 146* 59*  BUN 38* 37* 49* 51*  CREATININE 2.40* 2.24* 2.41* 2.24*  CALCIUM  8.5* 8.1* 7.9* 8.2*  PHOS 3.7 3.8  --   --    GFR: Estimated Creatinine Clearance: 21.6 mL/min (A) (by C-G formula based on SCr of 2.24 mg/dL (H)).   Scheduled Meds:  atorvastatin   80 mg Oral Daily   Chlorhexidine  Gluconate Cloth  6 each Topical Daily   docusate sodium   100 mg Oral BID   feeding supplement (GLUCERNA SHAKE)  237 mL Oral TID BM   folic acid  1 mg Oral Daily   heparin   5,000 Units Subcutaneous Q8H   insulin  aspart  0-20 Units Subcutaneous TID WC   insulin  aspart  0-5 Units Subcutaneous QHS   insulin  glargine-yfgn  20 Units Subcutaneous Daily   isosorbide-hydrALAZINE   1.5 tablet Oral TID   levETIRAcetam   500 mg Oral BID   multivitamin with minerals  1 tablet Oral Daily   nebivolol  7.5 mg Oral Daily   mouth rinse  15 mL  Mouth Rinse 4 times per day   pantoprazole   40 mg Oral Daily   polyethylene glycol  17 g Oral Daily   QUEtiapine  12.5 mg Oral QHS   sodium chloride  flush  10-40 mL Intracatheter Q12H   thiamine   100 mg Oral Daily   torsemide   20 mg Oral Daily      LOS: 17 days   Orvan Papadakis, DO [facility] Pager - Text Page per Tilford Foley  If 7PM-7AM, please contact night-coverage per Amion 10/11/2023, 7:30 PM

## 2023-10-11 NOTE — TOC Progression Note (Addendum)
 Transition of Care [facility]) - Progression Note    Patient Details  Name: Lori Gates MRN: [id number] Date of Birth: 01-21-1956  Transition of Care [facility]) CM/SW Contact  Arvel Oquinn A [place], LCSW Phone Number: 10/11/2023, 10:50 AM  Clinical Narrative:     Update 1620 Authorization started for pt to [facility] for SNF. In network with pt's insurance, with [employer]. Authorization pending.   CSW contacted pt's grandson, Tia Flowers, he said that he would like [facility] first then [facility] second for placement. CSW reached out to [facility] regarding bed availability.  Facility to start authorization as pt is medically stable, confirming they are in network with pt's insurance.   Pt has safety observation order, 48hr sitter rule at [facility].       Expected Discharge Plan: IP Rehab Facility Barriers to Discharge: Continued Medical Work up  Expected Discharge Plan and Services                                               Social Determinants of Health (SDOH) Interventions SDOH Screenings   Food Insecurity: Patient Unable To Answer (09/25/2023)  Housing: Unknown (09/25/2023)  Transportation Needs: Patient Unable To Answer (09/25/2023)  Utilities: Patient Unable To Answer (09/25/2023)  Social Connections: Patient Unable To Answer (09/25/2023)  Tobacco Use: Medium Risk (09/24/2023)    Readmission Risk Interventions     No data to display

## 2023-10-12 DIAGNOSIS — R569 Unspecified convulsions: Secondary | ICD-10-CM | POA: Diagnosis not present

## 2023-10-12 LAB — BASIC METABOLIC PANEL WITH GFR
Anion gap: 14 (ref 5–15)
BUN: 63 mg/dL — ABNORMAL HIGH (ref 8–23)
CO2: 23 mmol/L (ref 22–32)
Calcium: 8.3 mg/dL — ABNORMAL LOW (ref 8.9–10.3)
Chloride: 108 mmol/L (ref 98–111)
Creatinine, Ser: 2.64 mg/dL — ABNORMAL HIGH (ref 0.44–1.00)
GFR, Estimated: 19 mL/min — ABNORMAL LOW (ref >60)
Glucose, Bld: 168 mg/dL — ABNORMAL HIGH (ref 70–99)
Potassium: 4.8 mmol/L (ref 3.5–5.1)
Sodium: 145 mmol/L (ref 135–145)

## 2023-10-12 LAB — CBC WITH DIFFERENTIAL/PLATELET
Abs Immature Granulocytes: 0.05 10*3/uL (ref 0.00–0.07)
Basophils Absolute: 0.1 10*3/uL (ref 0.0–0.1)
Basophils Relative: 1 %
Eosinophils Absolute: 0.2 10*3/uL (ref 0.0–0.5)
Eosinophils Relative: 3 %
HCT: 22.3 % — ABNORMAL LOW (ref 36.0–46.0)
Hemoglobin: 7.2 g/dL — ABNORMAL LOW (ref 12.0–15.0)
Immature Granulocytes: 1 %
Lymphocytes Relative: 38 %
Lymphs Abs: 2.3 10*3/uL (ref 0.7–4.0)
MCH: 31.7 pg (ref 26.0–34.0)
MCHC: 32.3 g/dL (ref 30.0–36.0)
MCV: 98.2 fL (ref 80.0–100.0)
Monocytes Absolute: 0.6 10*3/uL (ref 0.1–1.0)
Monocytes Relative: 10 %
Neutro Abs: 2.9 10*3/uL (ref 1.7–7.7)
Neutrophils Relative %: 47 %
Platelets: 225 10*3/uL (ref 150–400)
RBC: 2.27 MIL/uL — ABNORMAL LOW (ref 3.87–5.11)
RDW: 13.2 % (ref 11.5–15.5)
WBC: 6 10*3/uL (ref 4.0–10.5)
nRBC: 0 % (ref 0.0–0.2)

## 2023-10-12 LAB — GLUCOSE, CAPILLARY
Glucose-Capillary: 158 mg/dL — ABNORMAL HIGH (ref 70–99)
Glucose-Capillary: 208 mg/dL — ABNORMAL HIGH (ref 70–99)
Glucose-Capillary: 283 mg/dL — ABNORMAL HIGH (ref 70–99)
Glucose-Capillary: 153 mg/dL — ABNORMAL HIGH (ref 70–99)

## 2023-10-12 NOTE — Plan of Care (Signed)
  Problem: Education: Goal: Knowledge of General Education information will improve Description: Including pain rating scale, medication(s)/side effects and non-pharmacologic comfort measures Outcome: Progressing   Problem: Health Behavior/Discharge Planning: Goal: Ability to manage health-related needs will improve Outcome: Progressing   Problem: Clinical Measurements: Goal: Ability to maintain clinical measurements within normal limits will improve Outcome: Progressing Goal: Will remain free from infection Outcome: Progressing Goal: Diagnostic test results will improve Outcome: Progressing Goal: Respiratory complications will improve Outcome: Progressing Goal: Cardiovascular complication will be avoided Outcome: Progressing   Problem: Activity: Goal: Risk for activity intolerance will decrease Outcome: Progressing   Problem: Nutrition: Goal: Adequate nutrition will be maintained Outcome: Progressing   Problem: Coping: Goal: Level of anxiety will decrease Outcome: Progressing   Problem: Elimination: Goal: Will not experience complications related to bowel motility Outcome: Progressing Goal: Will not experience complications related to urinary retention Outcome: Progressing   Problem: Pain Managment: Goal: General experience of comfort will improve and/or be controlled Outcome: Progressing   Problem: Safety: Goal: Ability to remain free from injury will improve Outcome: Progressing   Problem: Skin Integrity: Goal: Risk for impaired skin integrity will decrease Outcome: Progressing   Problem: Education: Goal: Ability to describe self-care measures that may prevent or decrease complications (Diabetes Survival Skills Education) will improve Outcome: Progressing Goal: Individualized Educational Video(s) Outcome: Progressing   Problem: Coping: Goal: Ability to adjust to condition or change in health will improve Outcome: Progressing   Problem: Fluid  Volume: Goal: Ability to maintain a balanced intake and output will improve Outcome: Progressing   Problem: Health Behavior/Discharge Planning: Goal: Ability to identify and utilize available resources and services will improve Outcome: Progressing Goal: Ability to manage health-related needs will improve Outcome: Progressing   Problem: Metabolic: Goal: Ability to maintain appropriate glucose levels will improve Outcome: Progressing   Problem: Nutritional: Goal: Maintenance of adequate nutrition will improve Outcome: Progressing Goal: Progress toward achieving an optimal weight will improve Outcome: Progressing   Problem: Skin Integrity: Goal: Risk for impaired skin integrity will decrease Outcome: Progressing   Problem: Tissue Perfusion: Goal: Adequacy of tissue perfusion will improve Outcome: Progressing   Problem: Activity: Goal: Ability to tolerate increased activity will improve Outcome: Progressing   Problem: Respiratory: Goal: Ability to maintain a clear airway and adequate ventilation will improve Outcome: Progressing   Problem: Role Relationship: Goal: Method of communication will improve Outcome: Progressing   Problem: Education: Goal: Ability to describe self-care measures that may prevent or decrease complications (Diabetes Survival Skills Education) will improve Outcome: Progressing Goal: Individualized Educational Video(s) Outcome: Progressing   Problem: Cardiac: Goal: Ability to maintain an adequate cardiac output will improve Outcome: Progressing   Problem: Health Behavior/Discharge Planning: Goal: Ability to identify and utilize available resources and services will improve Outcome: Progressing Goal: Ability to manage health-related needs will improve Outcome: Progressing   Problem: Fluid Volume: Goal: Ability to achieve a balanced intake and output will improve Outcome: Progressing   Problem: Metabolic: Goal: Ability to maintain  appropriate glucose levels will improve Outcome: Progressing   Problem: Nutritional: Goal: Maintenance of adequate nutrition will improve Outcome: Progressing Goal: Maintenance of adequate weight for body size and type will improve Outcome: Progressing   Problem: Respiratory: Goal: Will regain and/or maintain adequate ventilation Outcome: Progressing   Problem: Urinary Elimination: Goal: Ability to achieve and maintain adequate renal perfusion and functioning will improve Outcome: Progressing

## 2023-10-12 NOTE — Progress Notes (Signed)
 Lori Gates  ZOX:[id number] DOB: [birth year] DOA: 09/24/2023 PCP: Default, Provider, MD    Brief Narrative:  68 year old with uncontrolled DM2, CKD stage IV, HTN, diastolic CHF, and cocaine abuse who was admitted to the hospital 4/29 after she was found down on the floor unresponsive and seizing and ultimately diagnosed with DKA with seizure activity.  She required endotracheal intubation upon arrival to the ER, and was admitted to the ICU.  CT head was negative for acute bleeding.  MRI brain was negative for acute process.  After correction of her DKA she was able to be extubated but suffered with stridor post-extubation.  ENT was consulted and the patient was found to have a left vocal cord polyp.  She was taken to the OR by ENT with excision of the polyp and required approximately 12 hours of rest on the ventilator postoperatively before she could be re-extubated.  Significant Events: 4/29 found unconscious and seizing on floor -intubated in ER -admit to ICU in DKA 5/1 EEG negative for ongoing seizures -temporary HD cath placed 5/2 HD performed 5/4 extubated -postextubation developed stridor 5/6 ongoing stridor without respiratory distress -ENT performed fiberoptic laryngoscopy showing right vocal fold paralysis and large hemorrhagic polyp on the left vocal fold  5/7 2 OR for left vocal fold polyp excision -remained intubated postop due to low tidal volumes requiring additional night in ICU 5/9 transferred to [facility]  The patient has been evaluated by PT. They have recommended an air mattress and a telesitter so that patient may be sat up in a chair without risk of falling due to her impulsivity. She will also be given an air mattress.   On 10/11/2023 the patient is sitting up in bed eating lunch. No new complaints.   The patient is medically cleared for discharge pending placement.  Goals of Care:   Code Status: Full Code   DVT prophylaxis: heparin  injection 5,000 Units Start: 09/25/23  0600   Interim Hx: No acute events recorded overnight.  Afebrile.  Blood pressure elevated with systolics 157-195.  Alert and conversant but mildly confused today.  Denies specific complaints.  Assessment & Plan:  Uncontrolled DM2 - DKA with coma A1c 14 - DKA resolved - CBG now well-controlled with adjustments made in insulin  regimen during this admission. Glucoses are still elevated with values running from 61 to 308 in the past 24 hours. Will decrease lantus  to 20 units daily.   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 Status post polypectomy by ENT 5/7 - ENT to consider repeat flexible scope exam to re-evaluate her right vocal cord - if her vocal cord remains inoperable she will need a contrasted CT of the neck and chest to evaluate her recurrent laryngeal nerve - presently it is not felt wise to administer IV contrast while renal function is recovering  Metabolic encephalopathy due to DKA and toxic encephalopathy due to cocaine Mental status slow to improve -TSH elevated but free T4 normal - B12 is normal -folic acid  is low at 5.5 -making slow progress with stabilization of mental status -anticipate this will improve further with increasing activity and rehab.  Folic acid  deficiency Likely simply nutritional in setting of  substance abuse - supplementing  Seizure with postictal state Incited by DKA - continue Keppra   Acute kidney injury on CKD stage IV Required HD during initial portion of hospital stay - Nephrology has assisted in her care- baseline creatinine approximately 2.6 - HD cath removed 5/10 - creatinine holding steady at baseline  presently. Creatinine is 2.64 today.  Recent Labs  Lab 10/06/23 0822 10/08/23 0634 10/10/23 0712 10/12/23 0651  CREATININE 2.24* 2.41* 2.24* 2.64*    Normocytic anemia Likely simple anemia of chronic kidney disease - hemoglobin able  Paroxysmal atrial fibrillation NSR at present   Uncontrolled HTN Adjust medical therapy today and follow trend  HLD Continue Lipitor   Chronic diastolic CHF Appears euvolemic at present  Cocaine abuse UDS positive for cocaine at admission -patient has been counseled that she absolutely must discontinue cocaine use permanently  Family Communication: No family present at time of exam Disposition:  CIR -awaiting bed availability for discharge   Objective: Blood pressure (!) 152/66, pulse 77, temperature 98.4 F (36.9 C), temperature source Oral, resp. rate 17, height 5\' 5"  (1.651 m), weight 59.6 kg, SpO2 97%. No intake or output data in the 24 hours ending 10/12/23 1430  Filed Weights   10/10/23 0554 10/11/23 0500 10/12/23 0528  Weight: 66.3 kg 64.2 kg 59.6 kg    Exam:  Constitutional:  The patient is awake, alert, and oriented x 3. No acute distress. Respiratory:  No increased work of breathing. No wheezes, rales, or rhonchi No tactile fremitus Cardiovascular:  Regular rate and rhythm No murmurs, ectopy, or gallups. No lateral PMI. No thrills. Abdomen:  Abdomen is soft, non-tender, non-distended No hernias, masses, or organomegaly Normoactive bowel sounds.  Musculoskeletal:  No cyanosis, clubbing, or edema Skin:  No rashes, lesions, ulcers palpation of skin: no induration or nodules Neurologic:  CN 2-12 intact Sensation all 4 extremities intact Psychiatric:  Mental status Mood, affect appropriate Orientation to person, place, time  judgment and insight appear intact   CBC: Recent Labs  Lab 10/08/23 0634 10/10/23 0712 10/12/23 0651  WBC 8.4 8.0 6.0  NEUTROABS  --  4.6 2.9  HGB 8.1* 8.1* 7.2*  HCT 25.3* 25.4*  22.3*  MCV 95.8 96.6 98.2  PLT 303 300 225   Basic Metabolic Panel: Recent Labs  Lab 10/06/23 0822 10/08/23 0634 10/10/23 0712 10/12/23 0651  NA 144 142 143 145  K 4.0 4.2 4.1 4.8  CL 110 110 108 108  CO2 25 27 26 23   GLUCOSE 161* 146* 59* 168*  BUN 37* 49* 51* 63*  CREATININE 2.24* 2.41* 2.24* 2.64*  CALCIUM  8.1* 7.9* 8.2* 8.3*  PHOS 3.8  --   --   --    GFR: Estimated Creatinine Clearance: 18.4 mL/min (A) (by C-G formula based on SCr of 2.64 mg/dL (H)).   Scheduled Meds:  atorvastatin   80 mg Oral Daily   Chlorhexidine  Gluconate Cloth  6 each Topical Daily   docusate sodium   100 mg Oral BID   feeding supplement (GLUCERNA SHAKE)  237 mL Oral TID BM   folic acid   1 mg Oral Daily   heparin   5,000 Units Subcutaneous Q8H   insulin  aspart  0-20 Units Subcutaneous TID WC   insulin  aspart  0-5 Units Subcutaneous QHS   insulin  glargine-yfgn  20 Units Subcutaneous Daily   isosorbide -hydrALAZINE   1.5 tablet Oral TID   levETIRAcetam   500 mg Oral BID   multivitamin with minerals  1 tablet Oral Daily   nebivolol   7.5 mg Oral Daily  mouth rinse  15 mL Mouth Rinse 4 times per day   pantoprazole   40 mg Oral Daily   polyethylene glycol  17 g Oral Daily   QUEtiapine   12.5 mg Oral QHS   sodium chloride  flush  10-40 mL Intracatheter Q12H   thiamine   100 mg Oral Daily   torsemide   20 mg Oral Daily      LOS: 18 days   Lori Tibbitts, DO [facility] Pager - Text Page per Tilford Foley  If 7PM-7AM, please contact night-coverage per Amion 10/12/2023, 2:30 PM

## 2023-10-13 ENCOUNTER — Inpatient Hospital Stay (HOSPITAL_COMMUNITY): Payer: Medicare (Managed Care)

## 2023-10-13 DIAGNOSIS — R569 Unspecified convulsions: Secondary | ICD-10-CM | POA: Diagnosis not present

## 2023-10-13 LAB — BASIC METABOLIC PANEL WITH GFR
Anion gap: 9 (ref 5–15)
BUN: 65 mg/dL — ABNORMAL HIGH (ref 8–23)
CO2: 23 mmol/L (ref 22–32)
Calcium: 8.1 mg/dL — ABNORMAL LOW (ref 8.9–10.3)
Chloride: 109 mmol/L (ref 98–111)
Creatinine, Ser: 2.93 mg/dL — ABNORMAL HIGH (ref 0.44–1.00)
GFR, Estimated: 17 mL/min — ABNORMAL LOW (ref >60)
Glucose, Bld: 187 mg/dL — ABNORMAL HIGH (ref 70–99)
Potassium: 4.8 mmol/L (ref 3.5–5.1)
Sodium: 141 mmol/L (ref 135–145)

## 2023-10-13 LAB — CBC WITH DIFFERENTIAL/PLATELET
Abs Immature Granulocytes: 0.01 10*3/uL (ref 0.00–0.07)
Basophils Absolute: 0 10*3/uL (ref 0.0–0.1)
Basophils Relative: 1 %
Eosinophils Absolute: 0.2 10*3/uL (ref 0.0–0.5)
Eosinophils Relative: 4 %
HCT: 22.8 % — ABNORMAL LOW (ref 36.0–46.0)
Hemoglobin: 7 g/dL — ABNORMAL LOW (ref 12.0–15.0)
Immature Granulocytes: 0 %
Lymphocytes Relative: 31 %
Lymphs Abs: 1.6 10*3/uL (ref 0.7–4.0)
MCH: 30.3 pg (ref 26.0–34.0)
MCHC: 30.7 g/dL (ref 30.0–36.0)
MCV: 98.7 fL (ref 80.0–100.0)
Monocytes Absolute: 0.5 10*3/uL (ref 0.1–1.0)
Monocytes Relative: 9 %
Neutro Abs: 2.8 10*3/uL (ref 1.7–7.7)
Neutrophils Relative %: 55 %
Platelets: 228 10*3/uL (ref 150–400)
RBC: 2.31 MIL/uL — ABNORMAL LOW (ref 3.87–5.11)
RDW: 13.4 % (ref 11.5–15.5)
WBC: 5.1 10*3/uL (ref 4.0–10.5)
nRBC: 0 % (ref 0.0–0.2)

## 2023-10-13 LAB — GLUCOSE, CAPILLARY
Glucose-Capillary: 85 mg/dL (ref 70–99)
Glucose-Capillary: 223 mg/dL — ABNORMAL HIGH (ref 70–99)
Glucose-Capillary: 192 mg/dL — ABNORMAL HIGH (ref 70–99)
Glucose-Capillary: 230 mg/dL — ABNORMAL HIGH (ref 70–99)

## 2023-10-13 NOTE — Progress Notes (Signed)
 EEG complete - results pending

## 2023-10-13 NOTE — Procedures (Signed)
 Patient Name: WINNIE UMALI  MRN: [id number]  Epilepsy Attending: Arleene Lack  Referring Physician/Provider: Etter Hermann., MD  Date: 10/13/2023  Duration: 22.29 mins  Patient history: 68yo F with ams. EEG to evaluate for seizure  Level of alertness: Awake  AEDs during EEG study: LEV  Technical aspects: This EEG study was done with scalp electrodes positioned according to the 10-20 International system of electrode placement. Electrical activity was reviewed with band pass filter of 1-70Hz , sensitivity of 7 uV/mm, display speed of 13mm/sec with a 60Hz  notched filter applied as appropriate. EEG data were recorded continuously and digitally stored.  Video monitoring was available and reviewed as appropriate.  Description: The posterior dominant rhythm consists of 8 Hz activity of moderate voltage (25-35 uV) seen predominantly in posterior head regions, symmetric and reactive to eye opening and eye closing. Hyperventilation and photic stimulation were not performed.     IMPRESSION: This study is within normal limits. No seizures or epileptiform discharges were seen throughout the recording.  A normal interictal EEG does not exclude the diagnosis of epilepsy.   Kersti Scavone O Kainat Pizana

## 2023-10-13 NOTE — Plan of Care (Signed)
  Problem: Education: Goal: Knowledge of General Education information will improve Description: Including pain rating scale, medication(s)/side effects and non-pharmacologic comfort measures Outcome: Progressing   Problem: Health Behavior/Discharge Planning: Goal: Ability to manage health-related needs will improve Outcome: Progressing   Problem: Clinical Measurements: Goal: Ability to maintain clinical measurements within normal limits will improve Outcome: Progressing Goal: Will remain free from infection Outcome: Progressing Goal: Diagnostic test results will improve Outcome: Progressing Goal: Respiratory complications will improve Outcome: Progressing Goal: Cardiovascular complication will be avoided Outcome: Progressing   Problem: Activity: Goal: Risk for activity intolerance will decrease Outcome: Progressing   Problem: Nutrition: Goal: Adequate nutrition will be maintained Outcome: Progressing   Problem: Coping: Goal: Level of anxiety will decrease Outcome: Progressing   Problem: Elimination: Goal: Will not experience complications related to bowel motility Outcome: Progressing Goal: Will not experience complications related to urinary retention Outcome: Progressing   Problem: Pain Managment: Goal: General experience of comfort will improve and/or be controlled Outcome: Progressing   Problem: Safety: Goal: Ability to remain free from injury will improve Outcome: Progressing   Problem: Skin Integrity: Goal: Risk for impaired skin integrity will decrease Outcome: Progressing   Problem: Education: Goal: Ability to describe self-care measures that may prevent or decrease complications (Diabetes Survival Skills Education) will improve Outcome: Progressing Goal: Individualized Educational Video(s) Outcome: Progressing   Problem: Coping: Goal: Ability to adjust to condition or change in health will improve Outcome: Progressing   Problem: Fluid  Volume: Goal: Ability to maintain a balanced intake and output will improve Outcome: Progressing   Problem: Health Behavior/Discharge Planning: Goal: Ability to identify and utilize available resources and services will improve Outcome: Progressing Goal: Ability to manage health-related needs will improve Outcome: Progressing   Problem: Metabolic: Goal: Ability to maintain appropriate glucose levels will improve Outcome: Progressing   Problem: Nutritional: Goal: Maintenance of adequate nutrition will improve Outcome: Progressing Goal: Progress toward achieving an optimal weight will improve Outcome: Progressing   Problem: Skin Integrity: Goal: Risk for impaired skin integrity will decrease Outcome: Progressing   Problem: Tissue Perfusion: Goal: Adequacy of tissue perfusion will improve Outcome: Progressing   Problem: Activity: Goal: Ability to tolerate increased activity will improve Outcome: Progressing   Problem: Respiratory: Goal: Ability to maintain a clear airway and adequate ventilation will improve Outcome: Progressing   Problem: Role Relationship: Goal: Method of communication will improve Outcome: Progressing   Problem: Education: Goal: Ability to describe self-care measures that may prevent or decrease complications (Diabetes Survival Skills Education) will improve Outcome: Progressing Goal: Individualized Educational Video(s) Outcome: Progressing   Problem: Cardiac: Goal: Ability to maintain an adequate cardiac output will improve Outcome: Progressing   Problem: Health Behavior/Discharge Planning: Goal: Ability to identify and utilize available resources and services will improve Outcome: Progressing Goal: Ability to manage health-related needs will improve Outcome: Progressing   Problem: Fluid Volume: Goal: Ability to achieve a balanced intake and output will improve Outcome: Progressing   Problem: Metabolic: Goal: Ability to maintain  appropriate glucose levels will improve Outcome: Progressing   Problem: Nutritional: Goal: Maintenance of adequate nutrition will improve Outcome: Progressing Goal: Maintenance of adequate weight for body size and type will improve Outcome: Progressing   Problem: Respiratory: Goal: Will regain and/or maintain adequate ventilation Outcome: Progressing   Problem: Urinary Elimination: Goal: Ability to achieve and maintain adequate renal perfusion and functioning will improve Outcome: Progressing

## 2023-10-13 NOTE — Progress Notes (Signed)
 Lori Gates  ZOX:[id number] DOB: 1956/03/24 DOA: 09/24/2023 PCP: Default, Provider, MD    Brief Narrative:  68 year old with uncontrolled DM2, CKD stage IV, HTN, diastolic CHF, and cocaine abuse who was admitted to the hospital 4/29 after she was found down on the floor unresponsive and seizing and ultimately diagnosed with DKA with seizure activity.  She required endotracheal intubation upon arrival to the ER, and was admitted to the ICU.  CT head was negative for acute bleeding.  MRI brain was negative for acute process.  After correction of her DKA she was able to be extubated but suffered with stridor post-extubation.  ENT was consulted and the patient was found to have Devlyn Parish left vocal cord polyp.  She was taken to the OR by ENT with excision of the polyp and required approximately 12 hours of rest on the ventilator postoperatively before she could be re-extubated.  Significant Events: 4/29 found unconscious and seizing on floor -intubated in ER -admit to ICU in DKA 5/1 EEG negative for ongoing seizures -temporary HD cath placed 5/2 HD performed 5/4 extubated -postextubation developed stridor 5/6 ongoing stridor without respiratory distress -ENT performed fiberoptic laryngoscopy showing right vocal fold paralysis and large hemorrhagic polyp on the left vocal fold  5/7 2 OR for left vocal fold polyp excision -remained intubated postop due to low tidal volumes requiring additional night in ICU 5/9 transferred to [facility]  The patient has been evaluated by PT. They have recommended an air mattress and Lori Gates telesitter so that patient may be sat up in Shuree Brossart chair without risk of falling due to her impulsivity. She will also be given an air mattress.   The patient is medically cleared for discharge pending placement.  Goals of Care:   Code Status: Full Code   DVT prophylaxis: heparin  injection 5,000 Units Start: 09/25/23 0600  Assessment & Plan:  Uncontrolled DM2 - DKA with coma A1c 14 - DKA  resolved - CBG now well-controlled with adjustments made in insulin  regimen during this admission. Glucoses are still uncontrolled.  Continue lantus  to 20 units daily.   Acute hypoxic and hypercapnic respiratory failure Due to seizure activity and DKA - resolved with patient now stable on room air  Aspiration pneumonia versus pneumonitis Has completed Griff Badley 7-day course of empiric antibiotic therapy -  no persisting symptoms to suggest active infection  Post extubation stridor with right vocal fold paralysis and large left hemorrhagic polyp of the vocal fold Status post polypectomy by ENT 5/7  ENT to consider repeat flexible scope exam to re-evaluate her right vocal cord - if her vocal cord remains inoperable she will need Rosaria Kubin contrasted CT of the neck and chest to evaluate her recurrent laryngeal nerve - presently it is not felt wise to administer IV contrast while renal function is recovering will need follow up  Metabolic encephalopathy due to DKA and toxic encephalopathy due to cocaine Mental status slow to improve - still seems altered today, difficult to understand speech/repeating some words TSH elevated but free T4 normal (follow outpatient) B12 is normal  folic acid  is low at 5.5  MRI with punctate focus of 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  this is baseline since she's  been on the floor per discussion with nursing, will repeat EEG  Folic acid  deficiency Likely simply nutritional in setting of substance abuse - supplementing  Seizure with postictal state Incited by DKA - continue Keppra   Acute kidney injury on CKD stage IV Required HD during initial portion of  hospital stay  Nephrology has assisted in her care- baseline creatinine approximately 2.5-2.8  HD cath removed 5/10  creatinine up to 2.9 today, will trend  Plan for follow up in [place] in 3-4 weeks Follow renal US    Normocytic anemia Likely simple anemia of chronic kidney disease - hemoglobin able  Paroxysmal atrial fibrillation NSR at present   Uncontrolled HTN BP ok Bidil , bystolic , torsemide   HLD Continue Lipitor   Chronic diastolic CHF Appears euvolemic   Cocaine abuse Encourage cessation   Acute Superficial Vein Thrombosis of L Basilic Vein  Noted 4/30   Family Communication: called grandson for collateral no answer Disposition:  CIR -awaiting bed availability for discharge   Interim Hx: At baseline per RN No complaints - she's difficult to understand   Objective: Blood pressure 121/62, pulse 76, temperature 98.2 F (36.8 C), temperature source Oral, resp. rate 18, height 5\' 5"  (1.651 m), weight 65.2 kg, SpO2 100%.  Intake/Output Summary (Last 24 hours) at 10/13/2023 1638 Last data filed at 10/12/2023 2227 Gross per 24 hour  Intake 237 ml  Output --  Net 237 ml    Filed Weights   10/11/23 0500 10/12/23 0528 10/13/23 0446  Weight: 64.2 kg 59.6 kg 65.2 kg    Exam:  General: No acute distress. Cardiovascular: RRR Lungs: unlabored Abdomen: Soft, nontender, nondistended  Neurological: Lori Gates bit lethargic, repeating words - difficult to understand speech. (Per my discussion with RN, this is her baseline?) Moves all extremities 4. Cranial nerves II through XII grossly intact. Extremities: No clubbing or cyanosis. No edema.  CBC: Recent Labs  Lab 10/10/23 0712 10/12/23 0651 10/13/23 0949  WBC 8.0 6.0 5.1  NEUTROABS 4.6 2.9 2.8  HGB 8.1* 7.2* 7.0*  HCT 25.4* 22.3* 22.8*  MCV 96.6 98.2 98.7  PLT 300 225 228   Basic Metabolic Panel: Recent Labs  Lab 10/10/23 0712 10/12/23 0651 10/13/23 0949  NA 143 145 141  K 4.1 4.8 4.8  CL 108 108 109   CO2 26 23 23   GLUCOSE 59* 168* 187*  BUN 51* 63* 65*  CREATININE 2.24* 2.64* 2.93*  CALCIUM  8.2* 8.3* 8.1*   GFR: Estimated Creatinine Clearance: 16.5 mL/min (Jasani Lengel) (by C-G formula based on SCr of 2.93 mg/dL (H)).   Scheduled Meds:  atorvastatin   80 mg Oral Daily   Chlorhexidine  Gluconate Cloth  6 each Topical Daily   docusate sodium   100 mg Oral BID   feeding supplement (GLUCERNA SHAKE)  237 mL Oral TID BM   folic acid   1 mg Oral Daily   heparin   5,000 Units Subcutaneous Q8H   insulin  aspart  0-20 Units Subcutaneous TID WC   insulin  aspart  0-5 Units Subcutaneous QHS   insulin  glargine-yfgn  20 Units Subcutaneous Daily   isosorbide -hydrALAZINE   1.5 tablet Oral TID   levETIRAcetam   500 mg Oral BID   multivitamin with minerals  1 tablet Oral Daily   nebivolol   7.5 mg Oral Daily   mouth rinse  15 mL Mouth Rinse 4 times per day   pantoprazole   40 mg Oral Daily   polyethylene glycol  17 g Oral Daily   QUEtiapine   12.5 mg Oral QHS   sodium chloride  flush  10-40 mL Intracatheter Q12H   thiamine   100 mg Oral Daily   torsemide   20 mg Oral Daily      LOS: 19 days   Ava Swayze, DO [facility] Pager - Text Page per Tilford Foley  If 7PM-7AM, please contact night-coverage per Amion 10/13/2023, 4:38 PM

## 2023-10-14 DIAGNOSIS — R569 Unspecified convulsions: Secondary | ICD-10-CM | POA: Diagnosis not present

## 2023-10-14 LAB — COMPREHENSIVE METABOLIC PANEL WITH GFR
ALT: 71 U/L — ABNORMAL HIGH (ref 0–44)
AST: 103 U/L — ABNORMAL HIGH (ref 15–41)
Albumin: 2.1 g/dL — ABNORMAL LOW (ref 3.5–5.0)
Alkaline Phosphatase: 264 U/L — ABNORMAL HIGH (ref 38–126)
Anion gap: 6 (ref 5–15)
BUN: 73 mg/dL — ABNORMAL HIGH (ref 8–23)
CO2: 25 mmol/L (ref 22–32)
Calcium: 8.2 mg/dL — ABNORMAL LOW (ref 8.9–10.3)
Chloride: 110 mmol/L (ref 98–111)
Creatinine, Ser: 3.07 mg/dL — ABNORMAL HIGH (ref 0.44–1.00)
GFR, Estimated: 16 mL/min — ABNORMAL LOW (ref >60)
Glucose, Bld: 174 mg/dL — ABNORMAL HIGH (ref 70–99)
Potassium: 5 mmol/L (ref 3.5–5.1)
Sodium: 141 mmol/L (ref 135–145)
Total Bilirubin: 0.5 mg/dL (ref 0.0–1.2)
Total Protein: 5.6 g/dL — ABNORMAL LOW (ref 6.5–8.1)

## 2023-10-14 LAB — URINALYSIS, ROUTINE W REFLEX MICROSCOPIC
Bilirubin Urine: NEGATIVE
Glucose, UA: NEGATIVE mg/dL
Hgb urine dipstick: NEGATIVE
Ketones, ur: NEGATIVE mg/dL
Nitrite: NEGATIVE
Protein, ur: >=300 mg/dL — AB
Specific Gravity, Urine: 1.017 (ref 1.005–1.030)
pH: 5 (ref 5.0–8.0)

## 2023-10-14 LAB — GLUCOSE, CAPILLARY
Glucose-Capillary: 134 mg/dL — ABNORMAL HIGH (ref 70–99)
Glucose-Capillary: 157 mg/dL — ABNORMAL HIGH (ref 70–99)
Glucose-Capillary: 198 mg/dL — ABNORMAL HIGH (ref 70–99)
Glucose-Capillary: 214 mg/dL — ABNORMAL HIGH (ref 70–99)

## 2023-10-14 LAB — CBC WITH DIFFERENTIAL/PLATELET
Abs Immature Granulocytes: 0.02 10*3/uL (ref 0.00–0.07)
Basophils Absolute: 0 10*3/uL (ref 0.0–0.1)
Basophils Relative: 1 %
Eosinophils Absolute: 0.2 10*3/uL (ref 0.0–0.5)
Eosinophils Relative: 4 %
HCT: 20.8 % — ABNORMAL LOW (ref 36.0–46.0)
Hemoglobin: 6.5 g/dL — CL (ref 12.0–15.0)
Immature Granulocytes: 0 %
Lymphocytes Relative: 30 %
Lymphs Abs: 1.6 10*3/uL (ref 0.7–4.0)
MCH: 31 pg (ref 26.0–34.0)
MCHC: 31.3 g/dL (ref 30.0–36.0)
MCV: 99 fL (ref 80.0–100.0)
Monocytes Absolute: 0.5 10*3/uL (ref 0.1–1.0)
Monocytes Relative: 10 %
Neutro Abs: 2.9 10*3/uL (ref 1.7–7.7)
Neutrophils Relative %: 55 %
Platelets: 211 10*3/uL (ref 150–400)
RBC: 2.1 MIL/uL — ABNORMAL LOW (ref 3.87–5.11)
RDW: 13.3 % (ref 11.5–15.5)
WBC: 5.2 10*3/uL (ref 4.0–10.5)
nRBC: 0 % (ref 0.0–0.2)

## 2023-10-14 LAB — PHOSPHORUS: Phosphorus: 5 mg/dL — ABNORMAL HIGH (ref 2.5–4.6)

## 2023-10-14 LAB — MAGNESIUM: Magnesium: 1.9 mg/dL (ref 1.7–2.4)

## 2023-10-14 LAB — CBC
HCT: 24.3 % — ABNORMAL LOW (ref 36.0–46.0)
Hemoglobin: 7.6 g/dL — ABNORMAL LOW (ref 12.0–15.0)
MCH: 31 pg (ref 26.0–34.0)
MCHC: 31.3 g/dL (ref 30.0–36.0)
MCV: 99.2 fL (ref 80.0–100.0)
Platelets: 240 10*3/uL (ref 150–400)
RBC: 2.45 MIL/uL — ABNORMAL LOW (ref 3.87–5.11)
RDW: 13.2 % (ref 11.5–15.5)
WBC: 5.5 10*3/uL (ref 4.0–10.5)
nRBC: 0 % (ref 0.0–0.2)

## 2023-10-14 LAB — ABO/RH: ABO/RH(D): O POS

## 2023-10-14 LAB — HEMOGLOBIN AND HEMATOCRIT, BLOOD
HCT: 23.7 % — ABNORMAL LOW (ref 36.0–46.0)
Hemoglobin: 7.2 g/dL — ABNORMAL LOW (ref 12.0–15.0)

## 2023-10-14 LAB — PREPARE RBC (CROSSMATCH)

## 2023-10-14 MED ORDER — LACTATED RINGERS IV SOLN: INTRAVENOUS | Status: DC

## 2023-10-14 MED ORDER — SODIUM CHLORIDE 0.9% IV SOLUTION: Freq: Once | INTRAVENOUS | Status: AC

## 2023-10-14 NOTE — Progress Notes (Signed)
 Physical Therapy Treatment Patient Details Name: Lori Gates MRN: [id number] DOB: 12-10-1955 Today's Date: 10/14/2023   History of Present Illness 68 y.o. female admitted 09/24/23 after being found down, unresponsive, seizure and hyperglycemia; UDS (+) cocaine, benzos. Workup for DKA, seizures, UTI, sepsis, aspiration PNA vs pneumonitis. 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 ETT 4/29-5/2. iHD initiated 5/1. S/p microlaryngoscopy with excision of L true vocal cord polyp 5/7; ETT 5/7-5/8. [name] PMH includes asthma, DM2, HTN.    PT Comments  Pt received in supine, c/o feeling cold (thermostat at max warmth) and bed soiled and pt had not notified staff, pt agreeable to therapy session with encouragement. Pt continues to c/o sacral/bottom pain, RN/MD notified we still recommend air mattress as recommended on last session Thursday, as pt has been incontinent and not able to notify staff when needing assist for pressure relief or peri-care. Pt performs transfers with up to modA, benefits from +2 assist for safety when standing due to impulsivity and pt unable to assist with her own hygiene in standing. Pt initially agreeable to sitting up in chair and requesting to get OOB, but once up in recliner, pt c/o increased discomfort at bottom and appears restless, RN/NT notified of pt complaint. Patient will benefit from continued inpatient follow up therapy, <3 hours/day.    If plan is discharge home, recommend the following: Two people to help with bathing/dressing/bathroom;Assistance with cooking/housework;Direct supervision/assist for medications management;Direct supervision/assist for financial management;Assist for transportation;Help with stairs or ramp for entrance;Supervision due to cognitive status;Two people to help with walking and/or transfers   Can travel by private vehicle     No  Equipment Recommendations  Other (comment) (TBD - potential  walker, wheelchair with pressure cushion, BSC)    Recommendations for Other Services       Precautions / Restrictions Precautions Precautions: Fall;Other (comment) Recall of Precautions/Restrictions: Impaired Precaution/Restrictions Comments: bladder/bowel incontinence, LUE edema (pt states this is chronic), sacral pressure sore Restrictions Weight Bearing Restrictions Per Provider Order: No     Mobility  Bed Mobility Overal bed mobility: Needs Assistance Bed Mobility: Supine to Sit     Supine to sit: Min assist, HOB elevated, Used rails, Mod assist     General bed mobility comments: to R EOB; bed linens observed to be soiled as pt sitting up to EOB. minA to raise trunk but pt needs modA to scoot hips fully to foot flat.    Transfers Overall transfer level: Needs assistance Equipment used: Rolling walker (2 wheels) Transfers: Sit to/from Stand, Bed to chair/wheelchair/BSC Sit to Stand: Min assist, +2 safety/equipment   Step pivot transfers: Min assist, +2 safety/equipment       General transfer comment: EOB>BSC using RW then pivotal steps to her chair from [facility], pt sits impulsively/abruptly as soon as she gets to chair and c/o mild lightheadedness (BP stable sitting and reclined).    Ambulation/Gait               General Gait Details: pt very fatigued and c/o dizziness after transfer to chair, with increased pt irritation at any further encouragement; Pt also c/o severe bottom pain while sitting and while reclined, RN/MD notified.   Stairs             Wheelchair Mobility     Tilt Bed    Modified Rankin (Stroke Patients Only) Modified Rankin (Stroke Patients Only) Pre-Morbid Rankin Score: Moderate disability Modified Rankin: Severe disability     Balance Overall  balance assessment: Needs assistance Sitting-balance support: No upper extremity supported, Feet unsupported Sitting balance-Leahy Scale: Poor Sitting balance - Comments: limited  assessment, pt impulsive to lean in different directions but appears due to impulsivity/attempting to offload pain rather than due to weakness   Standing balance support: Bilateral upper extremity supported, Reliant on assistive device for balance Standing balance-Leahy Scale: Poor Standing balance comment: minA static standing with RW, min to modA for dynamic standing tasks                            Communication Communication Communication: Impaired Factors Affecting Communication: Reduced clarity of speech;Difficulty expressing self;Other (comment) (dysarthric)  Cognition Arousal: Alert Behavior During Therapy: Restless, Impulsive   PT - Cognitive impairments: No family/caregiver present to determine baseline, Attention, Initiation, Sequencing, Problem solving, Safety/Judgement, Memory, Orientation Difficult to assess due to: Impaired communication Orientation impairments: Place, Situation, Time                   PT - Cognition Comments: Pt aware she is having some memory deficits, no family present to determine her baseline, but pt more participatory after discussion on plan for session and pt assistance to get cleaned up with notable soiling on bed/gown when staff arrived to her room. Pt was appreciative of warm blankets to encourage her to sit in chair comfortably but once pt positioned in recliner, pt becomes restless again and frustrated by posture due to c/o bottom pain, RN and NT notified pt may need to get back to bed soon but needs new sheets. Following commands: Impaired Following commands impaired: Follows one step commands with increased time, Only follows one step commands consistently    Cueing Cueing Techniques: Verbal cues, Gestural cues, Tactile cues  Exercises Other Exercises Other Exercises: pt performed reclined ankle pumps x5 reps with verbal and tactile cues to initiate, encouraged hourly AP, pt will need reinforcement    General Comments  General comments (skin integrity, edema, etc.): skin breakdown on bottom as observed on 5/15, had to remove sacral foam during hygiene assist due to soiling, pt had significant BM with soft stools (and bed pad soiled), RN notified of pt BM and will need a new sacral foam applied.      Pertinent Vitals/Pain Pain Assessment Pain Assessment: PAINAD Breathing: normal Negative Vocalization: occasional moan/groan, low speech, negative/disapproving quality Facial Expression: facial grimacing Body Language: tense, distressed pacing, fidgeting Consolability: distracted or reassured by voice/touch PAINAD Score: 5 Pain Location: bottom while up in chair (with pillow under her chair pad for comfort) Pain Descriptors / Indicators: Discomfort, Grimacing, Guarding, Restless (often leaning forward and toward her L side, until pt reclined further) Pain Intervention(s): Limited activity within patient's tolerance, Monitored during session, Repositioned, Patient requesting pain meds-RN notified, Other (comment) (RN notified pt needs new sheets on her bed and wants to reposition back in bed once new sheets on it (old sheets were soiled in stool and removed by PTA during session))    Home Living                          Prior Function            PT Goals (current goals can now be found in the care plan section) Acute Rehab PT Goals Patient Stated Goal: to go home PT Goal Formulation: With patient Time For Goal Achievement: 10/14/23 Progress towards PT goals: Progressing toward goals  Frequency    Min 2X/week      PT Plan      Co-evaluation              AM-PAC PT "6 Clicks" Mobility   Outcome Measure  Help needed turning from your back to your side while in a flat bed without using bedrails?: A Little Help needed moving from lying on your back to sitting on the side of a flat bed without using bedrails?: A Lot (w/o rail) Help needed moving to and from a bed to a chair  (including a wheelchair)?: A Lot Help needed standing up from a chair using your arms (e.g., wheelchair or bedside chair)?: A Lot Help needed to walk in hospital room?: Total Help needed climbing 3-5 steps with a railing? : Total 6 Click Score: 11    End of Session Equipment Utilized During Treatment: Gait belt Activity Tolerance: Patient limited by fatigue;Patient limited by pain Patient left: with call bell/phone within reach;Other (comment);in chair;with chair alarm set (cushions on recliner back and bottom, telesitter in room) Nurse Communication: Mobility status;Other (comment);Patient requests pain meds;Precautions (pt discomfort at her bottom; needs new sheets placed (PTA removed soiled linens)) PT Visit Diagnosis: Muscle weakness (generalized) (M62.81);Unsteadiness on feet (R26.81);Other abnormalities of gait and mobility (R26.89)     Time: 1610-9604 PT Time Calculation (min) (ACUTE ONLY): 28 min  Charges:    $Therapeutic Activity: 23-37 mins PT General Charges $$ ACUTE PT VISIT: 1 Visit                     Rane Blitch P., PTA Acute Rehabilitation Services Secure Chat Preferred 9a-5:30pm Office: [phone]    Arville Laughter 10/14/2023, 3:36 PM

## 2023-10-14 NOTE — Plan of Care (Signed)
  Problem: Education: Goal: Knowledge of General Education information will improve Description: Including pain rating scale, medication(s)/side effects and non-pharmacologic comfort measures Outcome: Progressing   Problem: Health Behavior/Discharge Planning: Goal: Ability to manage health-related needs will improve Outcome: Progressing   Problem: Clinical Measurements: Goal: Ability to maintain clinical measurements within normal limits will improve Outcome: Progressing Goal: Will remain free from infection Outcome: Progressing Goal: Diagnostic test results will improve Outcome: Progressing Goal: Respiratory complications will improve Outcome: Progressing Goal: Cardiovascular complication will be avoided Outcome: Progressing   Problem: Activity: Goal: Risk for activity intolerance will decrease Outcome: Progressing   Problem: Nutrition: Goal: Adequate nutrition will be maintained Outcome: Progressing   Problem: Coping: Goal: Level of anxiety will decrease Outcome: Progressing   Problem: Elimination: Goal: Will not experience complications related to bowel motility Outcome: Progressing Goal: Will not experience complications related to urinary retention Outcome: Progressing   Problem: Pain Managment: Goal: General experience of comfort will improve and/or be controlled Outcome: Progressing   Problem: Safety: Goal: Ability to remain free from injury will improve Outcome: Progressing   Problem: Skin Integrity: Goal: Risk for impaired skin integrity will decrease Outcome: Progressing   Problem: Education: Goal: Ability to describe self-care measures that may prevent or decrease complications (Diabetes Survival Skills Education) will improve Outcome: Progressing Goal: Individualized Educational Video(s) Outcome: Progressing   Problem: Coping: Goal: Ability to adjust to condition or change in health will improve Outcome: Progressing   Problem: Fluid  Volume: Goal: Ability to maintain a balanced intake and output will improve Outcome: Progressing   Problem: Health Behavior/Discharge Planning: Goal: Ability to identify and utilize available resources and services will improve Outcome: Progressing Goal: Ability to manage health-related needs will improve Outcome: Progressing   Problem: Metabolic: Goal: Ability to maintain appropriate glucose levels will improve Outcome: Progressing   Problem: Nutritional: Goal: Maintenance of adequate nutrition will improve Outcome: Progressing Goal: Progress toward achieving an optimal weight will improve Outcome: Progressing   Problem: Skin Integrity: Goal: Risk for impaired skin integrity will decrease Outcome: Progressing   Problem: Tissue Perfusion: Goal: Adequacy of tissue perfusion will improve Outcome: Progressing   Problem: Activity: Goal: Ability to tolerate increased activity will improve Outcome: Progressing   Problem: Respiratory: Goal: Ability to maintain a clear airway and adequate ventilation will improve Outcome: Progressing   Problem: Role Relationship: Goal: Method of communication will improve Outcome: Progressing   Problem: Education: Goal: Ability to describe self-care measures that may prevent or decrease complications (Diabetes Survival Skills Education) will improve Outcome: Progressing Goal: Individualized Educational Video(s) Outcome: Progressing   Problem: Cardiac: Goal: Ability to maintain an adequate cardiac output will improve Outcome: Progressing   Problem: Health Behavior/Discharge Planning: Goal: Ability to identify and utilize available resources and services will improve Outcome: Progressing Goal: Ability to manage health-related needs will improve Outcome: Progressing   Problem: Fluid Volume: Goal: Ability to achieve a balanced intake and output will improve Outcome: Progressing   Problem: Metabolic: Goal: Ability to maintain  appropriate glucose levels will improve Outcome: Progressing   Problem: Nutritional: Goal: Maintenance of adequate nutrition will improve Outcome: Progressing Goal: Maintenance of adequate weight for body size and type will improve Outcome: Progressing   Problem: Respiratory: Goal: Will regain and/or maintain adequate ventilation Outcome: Progressing   Problem: Urinary Elimination: Goal: Ability to achieve and maintain adequate renal perfusion and functioning will improve Outcome: Progressing

## 2023-10-14 NOTE — Progress Notes (Signed)
 Speech Language Pathology Treatment: Dysphagia  Patient Details Name: Lori Gates MRN: [id number] DOB: 10-21-1955 Today's Date: 10/14/2023 Time: 4098-1191 SLP Time Calculation (min) (ACUTE ONLY): 6 min  Assessment / Plan / Recommendation Clinical Impression  Pt seen for ongoing dysphagia management.  RN reports good tolerance of POs.  Pt eating graham crackers on SLP arrival.  She exhibited good tolerance of solids.  Pt consumed 4 oz of honey thick liquid with no clinical s/s of aspiration.  With nectar thick liquid there was immediate wet cough.  Pt is stable for d/c but without placement yet.  SLP will continue to follow while in house for hopeful repeat instrumental assessment; however, suspect given vocal fold dysfunction observed on laryngoscopy that pt will need SLP and ENT follow up at next level of care.   Recommend mechanical soft solids with honey thick liquids.    HPI HPI: Lori Gates is a 68 year old female who presented to ED after daughter found on floor actively seizing, CBG read "high". Continued to seize from time of EMS dispatch to arrival to ER, and was intubated for airway protection.  ETT 4/29-5/4.  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 Pt with poorly controlled IDDM, CKD stage IV, HTN, LLD, HFpEF, substance abuse (cocaine) followed by nephrology in [place].      SLP Plan  Continue with current plan of care      Recommendations for follow up therapy are one component of a multi-disciplinary discharge planning process, led by the attending physician.  Recommendations may be updated based on patient status,  additional functional criteria and insurance authorization.    Recommendations  Diet recommendations: Dysphagia 3 (mechanical soft);Honey-thick liquid Liquids provided via: Cup Medication Administration: Whole meds with puree Supervision: Intermittent supervision to cue for compensatory strategies Compensations: Minimize environmental distractions;Slow rate;Small sips/bites Postural Changes and/or Swallow Maneuvers: Seated upright 90 degrees                  Oral care BID   Frequent or constant Supervision/Assistance Dysphagia, oropharyngeal phase (R13.12)     Continue with current plan of care     Elester Grim, MA, CCC-SLP Acute Rehabilitation Services Office: [phone] 10/14/2023, 9:07 AM

## 2023-10-14 NOTE — Progress Notes (Addendum)
 Lori Gates  ZOX:[id number] DOB: 1955/10/11 DOA: 09/24/2023 PCP: Default, Provider, MD    Brief Narrative:  68 year old with uncontrolled DM2, CKD stage IV, HTN, diastolic CHF, and cocaine abuse who was admitted to the hospital 4/29 after she was found down on the floor unresponsive and seizing and ultimately diagnosed with DKA with seizure activity.  She required endotracheal intubation upon arrival to the ER, and was admitted to the ICU.  CT head was negative for acute bleeding.  MRI brain was negative for acute process.  After correction of her DKA she was able to be extubated but suffered with stridor post-extubation.  ENT was consulted and the patient was found to have Lori Gates left vocal cord polyp.  She was taken to the OR by ENT with excision of the polyp and required approximately 12 hours of rest on the ventilator postoperatively before she could be re-extubated.  Significant Events: 4/29 found unconscious and seizing on floor -intubated in ER -admit to ICU in DKA 5/1 EEG negative for ongoing seizures -temporary HD cath placed 5/2 HD performed 5/4 extubated -postextubation developed stridor 5/6 ongoing stridor without respiratory distress -ENT performed fiberoptic laryngoscopy showing right vocal fold paralysis and large hemorrhagic polyp on the left vocal fold  5/7 2 OR for left vocal fold polyp excision -remained intubated postop due to low tidal volumes requiring additional night in ICU 5/9 transferred to [facility]  The patient has been evaluated by PT. They have recommended an air mattress and Lori Gates telesitter so that patient may be sat up in Lori Gates without risk of falling due to her impulsivity. She will also be given an air mattress.   The patient is medically cleared for discharge pending placement.  Goals of Care:   Code Status: Full Code   DVT prophylaxis: heparin  injection 5,000 Units Start: 09/25/23 0600  Assessment & Plan:  Uncontrolled DM2 - DKA with coma A1c 14 - DKA  resolved - CBG now well-controlled with adjustments made in insulin  regimen during this admission. Glucoses are still uncontrolled.  Continue lantus  to 20 units daily.   Acute hypoxic and hypercapnic respiratory failure Due to seizure activity and DKA - resolved with patient now stable on room air  Aspiration pneumonia versus pneumonitis Has completed Lori Gates 7-day course of empiric antibiotic therapy -  no persisting symptoms to suggest active infection  Post extubation stridor with right vocal fold paralysis and large left hemorrhagic polyp of the vocal fold Status post polypectomy by ENT 5/7  ENT to consider repeat flexible scope exam to re-evaluate her right vocal cord - if her vocal cord remains inoperable she will need Khalon Cansler contrasted CT of the neck and chest to evaluate her recurrent laryngeal nerve - presently it is not felt wise to administer IV contrast while renal function is recovering will need follow up  Metabolic encephalopathy due to DKA and toxic encephalopathy due to cocaine Mental status slow to improve - still seems altered today, difficult to understand speech/repeating some words TSH elevated but free T4 normal (follow outpatient) B12 is normal  folic acid  is low at 5.5  MRI with punctate focus of acute/early/subacute infarct involving the subcortical white matter of the L postcentral gyrus on 4/29 - per 4/30 neurology note, they did not think it needed further chasing "as it is either an incidental stroke finding due to small vessel disease or it could be secondary to seizure" Confusion noted 5/18 -> better today, but still with abnormal stuttering speech, but more alert today -  EEG without seizures or epileptiform discharges.  This may all be delirium?  Delirium precautions.  Will monitor, workup additionally prn.   Folic acid  deficiency Likely simply nutritional in setting of substance abuse - supplementing  Seizure with postictal state Incited by DKA - continue  Keppra   Acute kidney injury on CKD stage IV Required HD during initial portion of hospital stay  Nephrology has assisted in her care- baseline creatinine approximately 2.5-2.8  HD cath removed 5/10  creatinine up to 3.07 today (BUN 73), will trend - will ask for renal assistance if worsening Plan for follow up in [place] in 3-4 weeks renal US  without hydro  Normocytic anemia Hb has downtrended gradually during her hospitalization 12.7 at admission (not sure how accurate this was, ? Hemoconcentration in setting of DKA) -> has primarily been 8-10 recently.  Now Hb in 7's. 6.5 today, improved on repeat  Follow iron, b12, folate, ferritin  Paroxysmal atrial fibrillation NSR at present   Uncontrolled HTN BP ok Bidil , bystolic , torsemide   HLD Continue Lipitor   Chronic diastolic CHF Appears euvolemic   Cocaine abuse Encourage cessation   Acute Superficial Vein Thrombosis of L Basilic Vein  Noted 4/30   Family Communication: called grandson for collateral no answer Disposition:  SNF when medically stable   Interim Hx: Denies any complaints, doesn't remember seeing me yesterday Lori Gates&Ox3 (knows hospital, but notably can't specify which)   Objective: Blood pressure (!) 130/57, pulse 76, temperature 97.9 F (36.6 C), temperature source Oral, resp. rate 18, height 5\' 5"  (1.651 m), weight 64.5 kg, SpO2 99%.  Intake/Output Summary (Last 24 hours) at 10/14/2023 1448 Last data filed at 10/14/2023 1300 Gross per 24 hour  Intake 1107 ml  Output 100 ml  Net 1007 ml    Filed Weights   10/12/23 0528 10/13/23 0446 10/14/23 0500  Weight: 59.6 kg 65.2 kg 64.5 kg    Exam:  General: No acute distress. Cardiovascular: RRR Lungs: unlabored Abdomen: Soft, nontender, nondistended  Neurological: Alert and oriented 3 today, more alert.  Still abnormal speech, dysarthric and stuttuering. Moves all extremities 4. Cranial nerves II through XII grossly intact. Extremities: No clubbing  or cyanosis. No edema.   CBC: Recent Labs  Lab 10/12/23 0651 10/13/23 0949 10/14/23 0517 10/14/23 0822  WBC 6.0 5.1 5.2 5.5  NEUTROABS 2.9 2.8 2.9  --   HGB 7.2* 7.0* 6.5* 7.6*  HCT 22.3* 22.8* 20.8* 24.3*  MCV 98.2 98.7 99.0 99.2  PLT 225 228 211 240   Basic Metabolic Panel: Recent Labs  Lab 10/12/23 0651 10/13/23 0949 10/14/23 0517  NA 145 141 141  K 4.8 4.8 5.0  CL 108 109 110  CO2 23 23 25   GLUCOSE 168* 187* 174*  BUN 63* 65* 73*  CREATININE 2.64* 2.93* 3.07*  CALCIUM  8.3* 8.1* 8.2*  MG  --   --  1.9  PHOS  --   --  5.0*   GFR: Estimated Creatinine Clearance: 15.8 mL/min (Merisa Julio) (by C-G formula based on SCr of 3.07 mg/dL (H)).   Scheduled Meds:  atorvastatin   80 mg Oral Daily   Chlorhexidine  Gluconate Cloth  6 each Topical Daily   docusate sodium   100 mg Oral BID   feeding supplement (GLUCERNA SHAKE)  237 mL Oral TID BM   folic acid   1 mg Oral Daily   heparin   5,000 Units Subcutaneous Q8H   insulin  aspart  0-20 Units Subcutaneous TID WC   insulin  aspart  0-5 Units Subcutaneous QHS   insulin  glargine-yfgn  20 Units Subcutaneous Daily   isosorbide -hydrALAZINE   1.5 tablet Oral TID   levETIRAcetam   500 mg Oral BID   multivitamin with minerals  1 tablet Oral Daily   nebivolol   7.5 mg Oral Daily   mouth rinse  15 mL Mouth Rinse 4 times per day   pantoprazole   40 mg Oral Daily   polyethylene glycol  17 g Oral Daily   QUEtiapine   12.5 mg Oral QHS   sodium chloride  flush  10-40 mL Intracatheter Q12H   thiamine   100 mg Oral Daily   torsemide   20 mg Oral Daily      LOS: 20 days   Ava Swayze, DO [facility] Pager - Text Page per Tilford Foley  If 7PM-7AM, please contact night-coverage per Amion 10/14/2023, 2:48 PM

## 2023-10-14 NOTE — TOC Progression Note (Signed)
 Transition of Care [facility]) - Progression Note    Patient Details  Name: Lori Gates MRN: [id number] Date of Birth: February 07, 1956  Transition of Care [facility]) CM/SW Contact  Mackinley Kiehn A [place], LCSW Phone Number: 10/14/2023, 10:09 AM  Clinical Narrative:     Authorization approved for [facility].   Provided with report info when pt is able to DC, as not stable for discharge today.   Room 3221, report # [phone], ext [phone].    TOC will continue to follow.    Expected Discharge Plan: IP Rehab Facility Barriers to Discharge: Continued Medical Work up  Expected Discharge Plan and Services                                               Social Determinants of Health (SDOH) Interventions SDOH Screenings   Food Insecurity: Patient Unable To Answer (09/25/2023)  Housing: Unknown (09/25/2023)  Transportation Needs: Patient Unable To Answer (09/25/2023)  Utilities: Patient Unable To Answer (09/25/2023)  Social Connections: Patient Unable To Answer (09/25/2023)  Tobacco Use: Medium Risk (09/24/2023)    Readmission Risk Interventions     No data to display

## 2023-10-14 NOTE — Progress Notes (Signed)
 1 unit PRBCs ordered to be transfused for hemoglobin of 6.5.  CBC post blood transfusion ordered.  No charge note.

## 2023-10-15 DIAGNOSIS — E1110 Type 2 diabetes mellitus with ketoacidosis without coma: Secondary | ICD-10-CM | POA: Diagnosis not present

## 2023-10-15 DIAGNOSIS — G9340 Encephalopathy, unspecified: Secondary | ICD-10-CM | POA: Diagnosis not present

## 2023-10-15 DIAGNOSIS — F1490 Cocaine use, unspecified, uncomplicated: Secondary | ICD-10-CM | POA: Diagnosis not present

## 2023-10-15 DIAGNOSIS — R569 Unspecified convulsions: Secondary | ICD-10-CM | POA: Diagnosis not present

## 2023-10-15 LAB — COMPREHENSIVE METABOLIC PANEL WITH GFR
ALT: 73 U/L — ABNORMAL HIGH (ref 0–44)
AST: 67 U/L — ABNORMAL HIGH (ref 15–41)
Albumin: 2.6 g/dL — ABNORMAL LOW (ref 3.5–5.0)
Alkaline Phosphatase: 323 U/L — ABNORMAL HIGH (ref 38–126)
Anion gap: 10 (ref 5–15)
BUN: 75 mg/dL — ABNORMAL HIGH (ref 8–23)
CO2: 26 mmol/L (ref 22–32)
Calcium: 8.8 mg/dL — ABNORMAL LOW (ref 8.9–10.3)
Chloride: 109 mmol/L (ref 98–111)
Creatinine, Ser: 3.09 mg/dL — ABNORMAL HIGH (ref 0.44–1.00)
GFR, Estimated: 16 mL/min — ABNORMAL LOW (ref >60)
Glucose, Bld: 132 mg/dL — ABNORMAL HIGH (ref 70–99)
Potassium: 4.7 mmol/L (ref 3.5–5.1)
Sodium: 145 mmol/L (ref 135–145)
Total Bilirubin: 0.6 mg/dL (ref 0.0–1.2)
Total Protein: 6.8 g/dL (ref 6.5–8.1)

## 2023-10-15 LAB — CBC WITH DIFFERENTIAL/PLATELET
Abs Immature Granulocytes: 0.01 10*3/uL (ref 0.00–0.07)
Basophils Absolute: 0.1 10*3/uL (ref 0.0–0.1)
Basophils Relative: 1 %
Eosinophils Absolute: 0.2 10*3/uL (ref 0.0–0.5)
Eosinophils Relative: 5 %
HCT: 25.5 % — ABNORMAL LOW (ref 36.0–46.0)
Hemoglobin: 7.9 g/dL — ABNORMAL LOW (ref 12.0–15.0)
Immature Granulocytes: 0 %
Lymphocytes Relative: 32 %
Lymphs Abs: 1.6 10*3/uL (ref 0.7–4.0)
MCH: 30.7 pg (ref 26.0–34.0)
MCHC: 31 g/dL (ref 30.0–36.0)
MCV: 99.2 fL (ref 80.0–100.0)
Monocytes Absolute: 0.5 10*3/uL (ref 0.1–1.0)
Monocytes Relative: 10 %
Neutro Abs: 2.5 10*3/uL (ref 1.7–7.7)
Neutrophils Relative %: 52 %
Platelets: 263 10*3/uL (ref 150–400)
RBC: 2.57 MIL/uL — ABNORMAL LOW (ref 3.87–5.11)
RDW: 13.5 % (ref 11.5–15.5)
WBC: 4.9 10*3/uL (ref 4.0–10.5)
nRBC: 0 % (ref 0.0–0.2)

## 2023-10-15 LAB — FOLATE: Folate: 24.7 ng/mL (ref >5.9)

## 2023-10-15 LAB — FERRITIN: Ferritin: 63 ng/mL (ref 11–307)

## 2023-10-15 LAB — MAGNESIUM: Magnesium: 2.1 mg/dL (ref 1.7–2.4)

## 2023-10-15 LAB — IRON AND TIBC
Iron: 36 ug/dL (ref 28–170)
Saturation Ratios: 10 % — ABNORMAL LOW (ref 10.4–31.8)
TIBC: 357 ug/dL (ref 250–450)
UIBC: 321 ug/dL

## 2023-10-15 LAB — GLUCOSE, CAPILLARY
Glucose-Capillary: 125 mg/dL — ABNORMAL HIGH (ref 70–99)
Glucose-Capillary: 211 mg/dL — ABNORMAL HIGH (ref 70–99)
Glucose-Capillary: 153 mg/dL — ABNORMAL HIGH (ref 70–99)
Glucose-Capillary: 101 mg/dL — ABNORMAL HIGH (ref 70–99)

## 2023-10-15 LAB — VITAMIN B12: Vitamin B-12: 683 pg/mL (ref 180–914)

## 2023-10-15 LAB — PHOSPHORUS: Phosphorus: 5.4 mg/dL — ABNORMAL HIGH (ref 2.5–4.6)

## 2023-10-15 MED ORDER — LEVETIRACETAM 250 MG PO TABS
250.0000 mg | ORAL_TABLET | Freq: Two times a day (BID) | ORAL | Status: DC
  Administered 2023-10-15 – 2023-10-19 (×8): 250 mg via ORAL
  Filled 2023-10-15 (×9): qty 1

## 2023-10-15 NOTE — Progress Notes (Addendum)
 Lori Gates  ONG:[id number] DOB: 1955/12/06 DOA: 09/24/2023 PCP: Default, Provider, MD    Brief Narrative:  68 year old with uncontrolled DM2, CKD stage IV, HTN, diastolic CHF, and cocaine abuse who was admitted to the hospital 4/29 after she was found down on the floor unresponsive and seizing and ultimately diagnosed with DKA with seizure activity.  She required endotracheal intubation upon arrival to the ER, and was admitted to the ICU.  CT head was negative for acute bleeding.  MRI brain was negative for acute process.  After correction of her DKA she was able to be extubated but suffered with stridor post-extubation.  ENT was consulted and the patient was found to have Jesscia Imm left vocal cord polyp.  She was taken to the OR by ENT with excision of the polyp and required approximately 12 hours of rest on the ventilator postoperatively before she could be re-extubated.  Significant Events: 4/29 found unconscious and seizing on floor -intubated in ER -admit to ICU in DKA 5/1 EEG negative for ongoing seizures -temporary HD cath placed 5/2 HD performed 5/4 extubated -postextubation developed stridor 5/6 ongoing stridor without respiratory distress -ENT performed fiberoptic laryngoscopy showing right vocal fold paralysis and large hemorrhagic polyp on the left vocal fold  5/7 2 OR for left vocal fold polyp excision -remained intubated postop due to low tidal volumes requiring additional night in ICU 5/9 transferred to [facility] 5/20 neurology reeval requested for potential wean of Lori Gates Lori Gates for AMS  Goals of Care:   Code Status: Full Code   DVT prophylaxis: heparin  injection 5,000 Units Start: 09/25/23 0600  Assessment & Plan:  Uncontrolled DM2 - DKA with coma A1c 14 - DKA resolved - CBG now well-controlled with adjustments made in insulin  regimen during this admission. Glucoses are still uncontrolled.  Continue lantus  to 20 units daily.   Acute hypoxic and hypercapnic respiratory  failure Due to seizure activity and DKA - resolved with patient now stable on room air  Aspiration pneumonia versus pneumonitis Has completed Lori Gates 7-day course of empiric antibiotic therapy -  no persisting symptoms to suggest active infection  Post extubation stridor with right vocal fold paralysis and large left hemorrhagic polyp of the vocal fold Status post polypectomy by ENT 5/7  ENT to consider repeat flexible scope exam to re-evaluate her right vocal cord - if her vocal cord remains inoperable she will need Lori Gates contrasted CT of the neck and chest to evaluate her recurrent laryngeal nerve - presently it is not felt wise to administer IV contrast while renal function is recovering will need follow up timing of this with ENT (secure chat sent today -> plan is for this to be outpt when she recovers)?  Metabolic encephalopathy due to DKA and toxic encephalopathy due to cocaine Mental status slow to improve - waxing/waning TSH elevated but free T4 normal (follow outpatient) B12 is normal  folic acid  is low at 5.5  MRI with punctate focus of acute/early/subacute infarct involving the subcortical white matter of the L postcentral gyrus on 4/29 - per 4/30 neurology note, they did not think it needed further chasing "as it is either an incidental stroke finding due to small vessel disease or it could be secondary to seizure" Confusion noted 5/18 -> better 5/19 and 5/20 (sleepier today), but still with abnormal stuttering speech and doesn't sound like she's at her baseline from before the hospitalization per my discussion with her grandson  Repeat EEG without seizures or epileptiform discharges.  This may all be delirium?  Delirium precautions.  Will monitor, workup additionally prn.   Neurology to see today  Seizure with postictal state Incited by DKA - continue Lori Gates    Per neuro, when "the patient is fully awake and alert" we can consider gradual tapering of her Lori Gates  (5/4 plan of care, Dr.  Renaee Gates).  Reached out to neurology today 5/20 regarding this plan and her encephalopathy.    Acute kidney injury on CKD stage IV Required HD during initial portion of hospital stay  Nephrology has assisted in her care- baseline creatinine approximately 2.5-2.8  HD cath removed 5/10  creatinine up to 3.09 today (BUN 75), will trend - not far off from estimated baseline.  Consider renal consult if significant worsening.  Plan for follow up in [place] in 3-4 weeks renal US  without hydro  Normocytic anemia Folic acid  deficiency Hb has downtrended gradually during her hospitalization 12.7 at admission (not sure how accurate this was, ? Hemoconcentration in setting of DKA) -> has primarily been 8-10 recently.  Now Hb in 7's. No si/sx bleeding, will monitor.  Follow iron (low normal), b12 (wnl), ferritin (wnl) Folate supplementation  Paroxysmal atrial fibrillation NSR at present   Uncontrolled HTN Bidil , bystolic , torsemide  Adjust regimen prn, fluctuating  HLD Continue Lipitor   Chronic diastolic CHF Appears euvolemic   Cocaine abuse Encourage cessation   Acute Superficial Vein Thrombosis of L Basilic Vein  Noted 4/30   Family Communication: grandson 5/18 and 5/20  Disposition:  SNF when medically stable   Interim Hx: Sleepy, says she doesn't feel good today, can't get more specific  Objective: Blood pressure (!) 152/67, pulse 72, temperature 98.3 F (36.8 C), resp. rate 18, height 5\' 5"  (1.651 m), weight 62.7 kg, SpO2 95%.  Intake/Output Summary (Last 24 hours) at 10/15/2023 1127 Last data filed at 10/14/2023 1300 Gross per 24 hour  Intake 300 ml  Output --  Net 300 ml    Filed Weights   10/13/23 0446 10/14/23 0500 10/15/23 0711  Weight: 65.2 kg 64.5 kg 62.7 kg    Exam:  General: No acute distress. Cardiovascular: RRR Lungs: unlabored Abdomen: Soft, nontender, nondistended Neurological: more sleepy today, Lori Gates&O (knew hospital, but didn't know Lori Gates),  stuttering speech, moving all extremities Extremities: No clubbing or cyanosis. No edema.   CBC: Recent Labs  Lab 10/13/23 0949 10/14/23 0517 10/14/23 0822 10/14/23 1626 10/15/23 0848  WBC 5.1 5.2 5.5  --  4.9  NEUTROABS 2.8 2.9  --   --  2.5  HGB 7.0* 6.5* 7.6* 7.2* 7.9*  HCT 22.8* 20.8* 24.3* 23.7* 25.5*  MCV 98.7 99.0 99.2  --  99.2  PLT 228 211 240  --  263   Basic Metabolic Panel: Recent Labs  Lab 10/13/23 0949 10/14/23 0517 10/15/23 0848  NA 141 141 145  K 4.8 5.0 4.7  CL 109 110 109  CO2 23 25 26   GLUCOSE 187* 174* 132*  BUN 65* 73* 75*  CREATININE 2.93* 3.07* 3.09*  CALCIUM  8.1* 8.2* 8.8*  MG  --  1.9 2.1  PHOS  --  5.0* 5.4*   GFR: Estimated Creatinine Clearance: 15.7 mL/min (Lori Gates) (by C-G formula based on SCr of 3.09 mg/dL (H)).   Scheduled Meds:  atorvastatin   80 mg Oral Daily   Chlorhexidine  Gluconate Cloth  6 each Topical Daily   docusate sodium   100 mg Oral BID   feeding supplement (GLUCERNA SHAKE)  237 mL Oral TID BM   folic acid   1 mg Oral Daily   heparin   5,000 Units Subcutaneous Q8H   insulin  aspart  0-20 Units Subcutaneous TID WC   insulin  aspart  0-5 Units Subcutaneous QHS   insulin  glargine-yfgn  20 Units Subcutaneous Daily   isosorbide -hydrALAZINE   1.5 tablet Oral TID   levETIRAcetam   500 mg Oral BID   multivitamin with minerals  1 tablet Oral Daily   nebivolol   7.5 mg Oral Daily   mouth rinse  15 mL Mouth Rinse 4 times per day   pantoprazole   40 mg Oral Daily   polyethylene glycol  17 g Oral Daily   QUEtiapine   12.5 mg Oral QHS   sodium chloride  flush  10-40 mL Intracatheter Q12H   thiamine   100 mg Oral Daily   torsemide   20 mg Oral Daily      LOS: 21 days   Lori Swayze, DO [facility] Pager - Text Page per Lori Gates  If 7PM-7AM, please contact night-coverage per Amion 10/15/2023, 11:27 AM

## 2023-10-15 NOTE — Plan of Care (Signed)
  Problem: Education: Goal: Knowledge of General Education information will improve Description: Including pain rating scale, medication(s)/side effects and non-pharmacologic comfort measures Outcome: Progressing   Problem: Health Behavior/Discharge Planning: Goal: Ability to manage health-related needs will improve Outcome: Progressing   Problem: Clinical Measurements: Goal: Ability to maintain clinical measurements within normal limits will improve Outcome: Progressing Goal: Will remain free from infection Outcome: Progressing Goal: Diagnostic test results will improve Outcome: Progressing Goal: Respiratory complications will improve Outcome: Progressing Goal: Cardiovascular complication will be avoided Outcome: Progressing   Problem: Activity: Goal: Risk for activity intolerance will decrease Outcome: Progressing   Problem: Nutrition: Goal: Adequate nutrition will be maintained Outcome: Progressing   Problem: Coping: Goal: Level of anxiety will decrease Outcome: Progressing   Problem: Elimination: Goal: Will not experience complications related to bowel motility Outcome: Progressing Goal: Will not experience complications related to urinary retention Outcome: Progressing   Problem: Pain Managment: Goal: General experience of comfort will improve and/or be controlled Outcome: Progressing   Problem: Safety: Goal: Ability to remain free from injury will improve Outcome: Progressing   Problem: Skin Integrity: Goal: Risk for impaired skin integrity will decrease Outcome: Progressing   Problem: Education: Goal: Ability to describe self-care measures that may prevent or decrease complications (Diabetes Survival Skills Education) will improve Outcome: Progressing Goal: Individualized Educational Video(s) Outcome: Progressing   Problem: Coping: Goal: Ability to adjust to condition or change in health will improve Outcome: Progressing   Problem: Fluid  Volume: Goal: Ability to maintain a balanced intake and output will improve Outcome: Progressing   Problem: Health Behavior/Discharge Planning: Goal: Ability to identify and utilize available resources and services will improve Outcome: Progressing Goal: Ability to manage health-related needs will improve Outcome: Progressing   Problem: Metabolic: Goal: Ability to maintain appropriate glucose levels will improve Outcome: Progressing   Problem: Nutritional: Goal: Maintenance of adequate nutrition will improve Outcome: Progressing Goal: Progress toward achieving an optimal weight will improve Outcome: Progressing   Problem: Skin Integrity: Goal: Risk for impaired skin integrity will decrease Outcome: Progressing   Problem: Tissue Perfusion: Goal: Adequacy of tissue perfusion will improve Outcome: Progressing   Problem: Activity: Goal: Ability to tolerate increased activity will improve Outcome: Progressing   Problem: Respiratory: Goal: Ability to maintain a clear airway and adequate ventilation will improve Outcome: Progressing   Problem: Role Relationship: Goal: Method of communication will improve Outcome: Progressing   Problem: Education: Goal: Ability to describe self-care measures that may prevent or decrease complications (Diabetes Survival Skills Education) will improve Outcome: Progressing Goal: Individualized Educational Video(s) Outcome: Progressing   Problem: Cardiac: Goal: Ability to maintain an adequate cardiac output will improve Outcome: Progressing   Problem: Health Behavior/Discharge Planning: Goal: Ability to identify and utilize available resources and services will improve Outcome: Progressing Goal: Ability to manage health-related needs will improve Outcome: Progressing   Problem: Fluid Volume: Goal: Ability to achieve a balanced intake and output will improve Outcome: Progressing   Problem: Metabolic: Goal: Ability to maintain  appropriate glucose levels will improve Outcome: Progressing   Problem: Nutritional: Goal: Maintenance of adequate nutrition will improve Outcome: Progressing Goal: Maintenance of adequate weight for body size and type will improve Outcome: Progressing   Problem: Respiratory: Goal: Will regain and/or maintain adequate ventilation Outcome: Progressing   Problem: Urinary Elimination: Goal: Ability to achieve and maintain adequate renal perfusion and functioning will improve Outcome: Progressing

## 2023-10-15 NOTE — Progress Notes (Signed)
 Mobility Specialist: Progress Note   10/15/23 1145  Mobility  Activity Dangled on edge of bed  Level of Assistance Standby assist, set-up cues, supervision of patient - no hands on  Range of Motion/Exercises Left leg;Right leg;Active  Activity Response Tolerated fair  Mobility Referral Yes  Mobility visit 1 Mobility  Mobility Specialist Start Time (ACUTE ONLY) 0955  Mobility Specialist Stop Time (ACUTE ONLY) 1010  Mobility Specialist Time Calculation (min) (ACUTE ONLY) 15 min    Pt agreeable to mobility with encouragement, received in bed. Pt initially c/o being freezing cold and asking for some warmed blankets. After receiving the blankets, pt agreed to sit EOB and perform exercises. Performed seated marches and knee kick exercises but pt c/o headache and not feeling well throughout session. Unable to describe how bad her pain was. Returned pt to supine. Left in bed with all needs met, call bell in reach. Bed alarm on. Tele sitter in room.   Deloria Fetch Mobility Specialist Please contact via SecureChat or Rehab office at [phone]

## 2023-10-15 NOTE — Progress Notes (Signed)
 Subjective: No acute events overnight.  We called by medicine team due to waxing and waning mental status.  Patient was ready to try lunch, denied any concerns.  ROS: negative except above  Examination  Vital signs in last 24 hours: Temp:  [98.2 F (36.8 C)-98.4 F (36.9 C)] 98.3 F (36.8 C) (05/20 0752) Pulse Rate:  [69-74] 72 (05/20 0752) Resp:  [18-20] 18 (05/20 0533) BP: (128-152)/(62-72) 152/67 (05/20 0752) SpO2:  [95 %-98 %] 95 % (05/20 0752) Weight:  [62.7 kg] 62.7 kg (05/20 0711)  General: lying in bed, NAD Neuro: Awake, alert, oriented to time place person (did need some motivation to answer orientation questions), no aphasia, cranial nerves appear grossly intact,4+5 in all 4 extremities, sensory intact to light touch, FTN intact bilaterally  Basic Metabolic Panel: Recent Labs  Lab 10/10/23 0712 10/12/23 0651 10/13/23 0949 10/14/23 0517 10/15/23 0848  NA 143 145 141 141 145  K 4.1 4.8 4.8 5.0 4.7  CL 108 108 109 110 109  CO2 26 23 23 25 26   GLUCOSE 59* 168* 187* 174* 132*  BUN 51* 63* 65* 73* 75*  CREATININE 2.24* 2.64* 2.93* 3.07* 3.09*  CALCIUM  8.2* 8.3* 8.1* 8.2* 8.8*  MG  --   --   --  1.9 2.1  PHOS  --   --   --  5.0* 5.4*    CBC: Recent Labs  Lab 10/10/23 0712 10/12/23 0651 10/13/23 0949 10/14/23 0517 10/14/23 0822 10/14/23 1626 10/15/23 0848  WBC 8.0 6.0 5.1 5.2 5.5  --  4.9  NEUTROABS 4.6 2.9 2.8 2.9  --   --  2.5  HGB 8.1* 7.2* 7.0* 6.5* 7.6* 7.2* 7.9*  HCT 25.4* 22.3* 22.8* 20.8* 24.3* 23.7* 25.5*  MCV 96.6 98.2 98.7 99.0 99.2  --  99.2  PLT 300 225 228 211 240  --  263     Coagulation Studies: No results for input(s): "LABPROT", "INR" in the last 72 hours.  Imaging No new brain imaging overnight   ASSESSMENT AND PLAN: 68 year old female who presented with seizures in the setting of DKA and cocaine use disorder.  Reconsulted today due to waxing and waning mental status.  Acute encephalopathy - Reportedly patient has had waxing  and waning mental status.  Unfortunately when I went evaluate the patient she was pretty close to her baseline.  I requested medicine team to document when patient's mental status is worse because if it is worse after sundowning or early in the morning it could be related to delirium.  However if there is abrupt change in mental status throughout the day with gradual return to baseline that could be due to intermittent seizures and may require video EEG - Other causes of encephalopathy have been worked up by medicine team including TSH, folate, B12.  Patient is afebrile and no leukocytosis so low suspicion for infection - Continue delirium precautions - Discussed plan with Dr. Ada Acres via secure chat  Provoked seizure -In the setting of DKA and cocaine use - Reduce Keppra  to 250 mg twice daily as patient remains seizure-free - Continue seizure precautions - As needed IV Versed  for clinical seizure lasting more than 2 minutes - Recommend follow-up with neurology in 2 to 3 months after discharge and at that time patient remains seizure-free, will consider stopping Keppra   Seizure precautions: Per [place]  DMV statutes, patients with seizures are not allowed to drive until they have been seizure-free for six months and cleared by a physician    Use  caution when using heavy equipment or power tools. Avoid working on ladders or at heights. Take showers instead of baths. Ensure the water temperature is not too high on the home water heater. Do not go swimming alone. Do not lock yourself in a room alone (i.e. bathroom). When caring for infants or small children, sit down when holding, feeding, or changing them to minimize risk of injury to the child in the event you have a seizure. Maintain good sleep hygiene. Avoid alcohol.    If patient has another seizure, call 911 and bring them back to the ED if: A.  The seizure lasts longer than 5 minutes.      B.  The patient doesn't wake shortly after the  seizure or has new problems such as difficulty seeing, speaking or moving following the seizure C.  The patient was injured during the seizure D.  The patient has a temperature over 102 F (39C) E.  The patient vomited during the seizure and now is having trouble breathing    During the Seizure   - First, ensure adequate ventilation and place patients on the floor on their left side  Loosen clothing around the neck and ensure the airway is patent. If the patient is clenching the teeth, do not force the mouth open with any object as this can cause severe damage - Remove all items from the surrounding that can be hazardous. The patient may be oblivious to what's happening and may not even know what he or she is doing. If the patient is confused and wandering, either gently guide him/her away and block access to outside areas - Reassure the individual and be comforting - Call 911. In most cases, the seizure ends before EMS arrives. However, there are cases when seizures may last over 3 to 5 minutes. Or the individual may have developed breathing difficulties or severe injuries. If a pregnant patient or a person with diabetes develops a seizure, it is prudent to call an ambulance.   After the Seizure (Postictal Stage)   After a seizure, most patients experience confusion, fatigue, muscle pain and/or a headache. Thus, one should permit the individual to sleep. For the next few days, reassurance is essential. Being calm and helping reorient the person is also of importance.   Most seizures are painless and end spontaneously. Seizures are not harmful to others but can lead to complications such as stress on the lungs, brain and the heart. Individuals with prior lung problems may develop labored breathing and respiratory distress.     Thank you for allowing us  to participate in the care of this patient.  Please call us  back for any further questions.   I have spent a total of  36  minutes with the  patient reviewing hospital notes,  test results, labs and examining the patient as well as establishing an assessment and plan that was discussed personally with the patient.  > 50% of time was spent in direct patient care.       Roxy Cordial Epilepsy [facility] For questions after 5pm please refer to AMION to reach the Neurologist on call

## 2023-10-16 DIAGNOSIS — R569 Unspecified convulsions: Secondary | ICD-10-CM | POA: Diagnosis not present

## 2023-10-16 LAB — GLUCOSE, CAPILLARY
Glucose-Capillary: 72 mg/dL (ref 70–99)
Glucose-Capillary: 178 mg/dL — ABNORMAL HIGH (ref 70–99)
Glucose-Capillary: 200 mg/dL — ABNORMAL HIGH (ref 70–99)
Glucose-Capillary: 103 mg/dL — ABNORMAL HIGH (ref 70–99)

## 2023-10-16 NOTE — Progress Notes (Signed)
 OT Cancellation Note  Patient Details Name: LADINA SHUTTERS MRN: [id number] DOB: [birth year]   Cancelled Treatment:    Reason Eval/Treat Not Completed: Patient declined, no reason specified  Aiyah Scarpelli K, OTD, OTR/L [facility] [phone]   Benedict Brain Koonce 10/16/2023, 2:19 PM

## 2023-10-16 NOTE — Progress Notes (Signed)
 PROGRESS NOTE    Lori Gates  LKG:[id number] DOB: 07-02-1955 DOA: 09/24/2023 PCP: Default, Provider, MD   Brief Narrative:  This 68 year old  Female with uncontrolled DM2, CKD stage IV, HTN, diastolic CHF, and cocaine abuse who was admitted to the hospital on 4/29 after she was found down on the floor unresponsive and seizing and ultimately diagnosed with DKA with seizure activity.  She required endotracheal intubation upon arrival to the ER, and was admitted to the ICU.  CT head was negative for acute bleeding.  MRI brain was negative for acute process. After correction of her DKA she was able to be extubated but suffered with stridor post-extubation.  ENT was consulted and the patient was found to have a left vocal cord polyp.  She was taken to the OR by ENT with excision of the polyp and required approximately 12 hours of rest on the ventilator postoperatively before she could be re-extubated. TRH Pick up 5/14  Assessment & Plan:   Principal Problem:   Seizure (HCC) Active Problems:   Acute encephalopathy   Acute hypercapnic respiratory failure (HCC)   Protein-calorie malnutrition, severe   Stridor   Vocal cord paralysis   Vocal cord polyp   DKA with coma , uncontrolled type 2 diabetes: Hb A1c 14 - DKA resolved - CBG now well-controlled with adjustments made in insulin  regimen during this admission. Glucoses are still uncontrolled.  Continue lantus  to 20 units daily.  Diabetic coordinator consulted.   Acute hypoxic and hypercapnic respiratory failure: Due to seizure activity and DKA - resolved. Now patient stable on room air.   Aspiration pneumonia versus pneumonitis: Has completed a 7-day course of empiric antibiotic therapy . No persisting symptoms to suggest active infection.   Post extubation stridor with right vocal fold paralysis and large left hemorrhagic polyp of the vocal fold. Status post polypectomy by ENT 5/7  ENT to consider repeat flexible scope exam to  re-evaluate her right vocal cord - if her vocal cord remains inoperable,  She will need a contrasted CT of the neck and chest to evaluate her recurrent laryngeal nerve - presently it is not felt wise to administer IV contrast while renal function is recovering.  will need follow up timing of this with ENT (secure chat sent by Dr. Ada Acres -> plan is for this to be outpt when she recovers)?   Metabolic encephalopathy likely multifactorial: It could be due to DKA and toxic encephalopathy due to cocaine Mental status slow to improve - waxing/waning TSH elevated but free T4 normal (follow outpatient) B12 is normal , Folic acid  is low at 5.5  MRI with punctate focus of acute/early/subacute infarct . Neurology did not think it needed further chasing "as it is either an incidental stroke finding due to small vessel disease or it could be secondary to seizure". Repeat EEG without seizures or epileptiform discharges.  This may all be delirium?   Delirium precautions.  Will monitor, workup additionally prn.   Neurology thinks patient is back to her baseline mental status.   Seizure with postictal state Incited by DKA - continue Keppra . Reduce Keppra  to 250 mg twice daily as patient remains seizure-free.   Acute kidney injury on CKD stage IV Required HD during initial portion of hospital stay. Nephrology has assisted in her care- baseline creatinine approximately 2.5-2.8  HD cath removed 5/10  Creatinine up to 3.09 today (BUN 75), will trend - not far off from estimated baseline.  Consider renal consult if significant worsening.  Plan  for follow up in [place] in 3-4 weeks Renal US  without hydronephrosis.   Normocytic anemia Folic acid  deficiency Hb has downtrended gradually during her hospitalization. Hb 12.7 at admission (not sure how accurate this was, ? Hemoconcentration in setting of DKA) -> has primarily been 8-10 recently.  Now Hb in 7's. No si/sx bleeding, will monitor.  Follow iron (low  normal), b12 (wnl), ferritin (wnl) Folate supplementation   Paroxysmal atrial fibrillation NSR at present.  Heart rate controlled.   Uncontrolled HTN: Continue Bidil , bystolic , torsemide  Adjust regimen prn, fluctuating   HLD Continue Lipitor    Chronic diastolic CHF Appears euvolemic    Cocaine abuse Encourage cessation    Acute Superficial Vein Thrombosis of L Basilic Vein  Noted 4/30      DVT prophylaxis: Heparin  Code Status: Full code Family Communication: No family at bedside. Disposition Plan: SNF when medically ready  Consultants:  Neurology  Procedures:  Antimicrobials: Anti-infectives (From admission, onward)    Start     Dose/Rate Route Frequency Ordered Stop   09/25/23 1800  cefTRIAXone  (ROCEPHIN ) 2 g in sodium chloride  0.9 % 100 mL IVPB        2 g 200 mL/hr over 30 Minutes Intravenous Every 24 hours 09/25/23 0015 09/29/23 1833   09/24/23 1915  cefTRIAXone  (ROCEPHIN ) 1 g in sodium chloride  0.9 % 100 mL IVPB        1 g 200 mL/hr over 30 Minutes Intravenous  Once 09/24/23 1902 09/24/23 2010       Subjective: Patient was seen and examined at bedside.  Overnight events noted. Patient reports she is better than before.  Still complains of swallowing.  Objective: Vitals:   10/15/23 1618 10/15/23 2019 10/16/23 0549 10/16/23 1022  BP: 129/61 128/74 124/60 (!) 163/72  Pulse: 69 74 68 70  Resp:  18 18 16   Temp: 97.8 F (36.6 C) 98.3 F (36.8 C) 98.5 F (36.9 C) 97.7 F (36.5 C)  TempSrc:  Oral Oral Oral  SpO2: 97% 95% 97% 98%  Weight:   62.7 kg   Height:        Intake/Output Summary (Last 24 hours) at 10/16/2023 1122 Last data filed at 10/15/2023 2115 Gross per 24 hour  Intake 237 ml  Output --  Net 237 ml   Filed Weights   10/14/23 0500 10/15/23 0711 10/16/23 0549  Weight: 64.5 kg 62.7 kg 62.7 kg    Examination:  General exam: Appears calm and comfortable, severely deconditioned, not in any acute distress. Respiratory system: Clear to  auscultation. Respiratory effort normal.  RR 15 Cardiovascular system: S1 & S2 heard, RRR. No JVD, murmurs, rubs, gallops or clicks. Gastrointestinal system: Abdomen is non distended, soft and non tender. No organomegaly or masses felt. Normal bowel sounds heard. Central nervous system: Alert and oriented x 2. No focal neurological deficits.  Stuttering speech Extremities: No edema, no cyanosis, no clubbing Skin: No rashes, lesions or ulcers Psychiatry: . Mood & affect appropriate.   Data Reviewed: I have personally reviewed following labs and imaging studies  CBC: Recent Labs  Lab 10/10/23 0712 10/12/23 0651 10/13/23 0949 10/14/23 0517 10/14/23 0822 10/14/23 1626 10/15/23 0848  WBC 8.0 6.0 5.1 5.2 5.5  --  4.9  NEUTROABS 4.6 2.9 2.8 2.9  --   --  2.5  HGB 8.1* 7.2* 7.0* 6.5* 7.6* 7.2* 7.9*  HCT 25.4* 22.3* 22.8* 20.8* 24.3* 23.7* 25.5*  MCV 96.6 98.2 98.7 99.0 99.2  --  99.2  PLT 300 225 228 211 240  --  263   Basic Metabolic Panel: Recent Labs  Lab 10/10/23 0712 10/12/23 0651 10/13/23 0949 10/14/23 0517 10/15/23 0848  NA 143 145 141 141 145  K 4.1 4.8 4.8 5.0 4.7  CL 108 108 109 110 109  CO2 26 23 23 25 26   GLUCOSE 59* 168* 187* 174* 132*  BUN 51* 63* 65* 73* 75*  CREATININE 2.24* 2.64* 2.93* 3.07* 3.09*  CALCIUM  8.2* 8.3* 8.1* 8.2* 8.8*  MG  --   --   --  1.9 2.1  PHOS  --   --   --  5.0* 5.4*   GFR: Estimated Creatinine Clearance: 15.7 mL/min (A) (by C-G formula based on SCr of 3.09 mg/dL (H)). Liver Function Tests: Recent Labs  Lab 10/14/23 0517 10/15/23 0848  AST 103* 67*  ALT 71* 73*  ALKPHOS 264* 323*  BILITOT 0.5 0.6  PROT 5.6* 6.8  ALBUMIN 2.1* 2.6*   No results for input(s): "LIPASE", "AMYLASE" in the last 168 hours. No results for input(s): "AMMONIA" in the last 168 hours. Coagulation Profile: No results for input(s): "INR", "PROTIME" in the last 168 hours. Cardiac Enzymes: No results for input(s): "CKTOTAL", "CKMB", "CKMBINDEX",  "TROPONINI" in the last 168 hours. BNP (last 3 results) No results for input(s): "PROBNP" in the last 8760 hours. HbA1C: No results for input(s): "HGBA1C" in the last 72 hours. CBG: Recent Labs  Lab 10/15/23 0754 10/15/23 1203 10/15/23 1621 10/15/23 2020 10/16/23 0757  GLUCAP 125* 211* 153* 101* 72   Lipid Profile: No results for input(s): "CHOL", "HDL", "LDLCALC", "TRIG", "CHOLHDL", "LDLDIRECT" in the last 72 hours. Thyroid Function Tests: No results for input(s): "TSH", "T4TOTAL", "FREET4", "T3FREE", "THYROIDAB" in the last 72 hours. Anemia Panel: Recent Labs    10/15/23 0848  VITAMINB12 683  FOLATE 24.7  FERRITIN 63  TIBC 357  IRON 36   Sepsis Labs: No results for input(s): "PROCALCITON", "LATICACIDVEN" in the last 168 hours.  No results found for this or any previous visit (from the past 240 hours).   Radiology Studies: No results found.  Scheduled Meds:  atorvastatin   80 mg Oral Daily   Chlorhexidine  Gluconate Cloth  6 each Topical Daily   docusate sodium   100 mg Oral BID   feeding supplement (GLUCERNA SHAKE)  237 mL Oral TID BM   folic acid   1 mg Oral Daily   heparin   5,000 Units Subcutaneous Q8H   insulin  aspart  0-20 Units Subcutaneous TID WC   insulin  aspart  0-5 Units Subcutaneous QHS   insulin  glargine-yfgn  20 Units Subcutaneous Daily   isosorbide -hydrALAZINE   1.5 tablet Oral TID   levETIRAcetam   250 mg Oral BID   multivitamin with minerals  1 tablet Oral Daily   nebivolol   7.5 mg Oral Daily   mouth rinse  15 mL Mouth Rinse 4 times per day   pantoprazole   40 mg Oral Daily   polyethylene glycol  17 g Oral Daily   QUEtiapine   12.5 mg Oral QHS   sodium chloride  flush  10-40 mL Intracatheter Q12H   thiamine   100 mg Oral Daily   torsemide   20 mg Oral Daily   Continuous Infusions:   LOS: 22 days    Time spent: 50 mins    Magdalene School, MD [facility]   If 7PM-7AM, please contact night-coverage

## 2023-10-16 NOTE — Plan of Care (Signed)
  Problem: Education: Goal: Knowledge of General Education information will improve Description: Including pain rating scale, medication(s)/side effects and non-pharmacologic comfort measures Outcome: Progressing   Problem: Health Behavior/Discharge Planning: Goal: Ability to manage health-related needs will improve Outcome: Progressing   Problem: Clinical Measurements: Goal: Ability to maintain clinical measurements within normal limits will improve Outcome: Progressing Goal: Will remain free from infection Outcome: Progressing Goal: Diagnostic test results will improve Outcome: Progressing Goal: Respiratory complications will improve Outcome: Progressing Goal: Cardiovascular complication will be avoided Outcome: Progressing   Problem: Activity: Goal: Risk for activity intolerance will decrease Outcome: Progressing   Problem: Nutrition: Goal: Adequate nutrition will be maintained Outcome: Progressing   Problem: Coping: Goal: Level of anxiety will decrease Outcome: Progressing   Problem: Elimination: Goal: Will not experience complications related to bowel motility Outcome: Progressing Goal: Will not experience complications related to urinary retention Outcome: Progressing   Problem: Pain Managment: Goal: General experience of comfort will improve and/or be controlled Outcome: Progressing   Problem: Safety: Goal: Ability to remain free from injury will improve Outcome: Progressing   Problem: Skin Integrity: Goal: Risk for impaired skin integrity will decrease Outcome: Progressing   Problem: Education: Goal: Ability to describe self-care measures that may prevent or decrease complications (Diabetes Survival Skills Education) will improve Outcome: Progressing Goal: Individualized Educational Video(s) Outcome: Progressing   Problem: Coping: Goal: Ability to adjust to condition or change in health will improve Outcome: Progressing   Problem: Fluid  Volume: Goal: Ability to maintain a balanced intake and output will improve Outcome: Progressing   Problem: Health Behavior/Discharge Planning: Goal: Ability to identify and utilize available resources and services will improve Outcome: Progressing Goal: Ability to manage health-related needs will improve Outcome: Progressing   Problem: Metabolic: Goal: Ability to maintain appropriate glucose levels will improve Outcome: Progressing   Problem: Nutritional: Goal: Maintenance of adequate nutrition will improve Outcome: Progressing Goal: Progress toward achieving an optimal weight will improve Outcome: Progressing   Problem: Skin Integrity: Goal: Risk for impaired skin integrity will decrease Outcome: Progressing   Problem: Tissue Perfusion: Goal: Adequacy of tissue perfusion will improve Outcome: Progressing   Problem: Activity: Goal: Ability to tolerate increased activity will improve Outcome: Progressing   Problem: Respiratory: Goal: Ability to maintain a clear airway and adequate ventilation will improve Outcome: Progressing   Problem: Role Relationship: Goal: Method of communication will improve Outcome: Progressing   Problem: Education: Goal: Ability to describe self-care measures that may prevent or decrease complications (Diabetes Survival Skills Education) will improve Outcome: Progressing Goal: Individualized Educational Video(s) Outcome: Progressing   Problem: Cardiac: Goal: Ability to maintain an adequate cardiac output will improve Outcome: Progressing   Problem: Health Behavior/Discharge Planning: Goal: Ability to identify and utilize available resources and services will improve Outcome: Progressing Goal: Ability to manage health-related needs will improve Outcome: Progressing   Problem: Fluid Volume: Goal: Ability to achieve a balanced intake and output will improve Outcome: Progressing   Problem: Metabolic: Goal: Ability to maintain  appropriate glucose levels will improve Outcome: Progressing   Problem: Nutritional: Goal: Maintenance of adequate nutrition will improve Outcome: Progressing Goal: Maintenance of adequate weight for body size and type will improve Outcome: Progressing   Problem: Respiratory: Goal: Will regain and/or maintain adequate ventilation Outcome: Progressing   Problem: Urinary Elimination: Goal: Ability to achieve and maintain adequate renal perfusion and functioning will improve Outcome: Progressing

## 2023-10-17 DIAGNOSIS — R569 Unspecified convulsions: Secondary | ICD-10-CM | POA: Diagnosis not present

## 2023-10-17 LAB — GLUCOSE, CAPILLARY
Glucose-Capillary: 61 mg/dL — ABNORMAL LOW (ref 70–99)
Glucose-Capillary: 80 mg/dL (ref 70–99)
Glucose-Capillary: 188 mg/dL — ABNORMAL HIGH (ref 70–99)
Glucose-Capillary: 256 mg/dL — ABNORMAL HIGH (ref 70–99)
Glucose-Capillary: 206 mg/dL — ABNORMAL HIGH (ref 70–99)

## 2023-10-17 MED ORDER — LOPERAMIDE HCL 2 MG PO CAPS
2.0000 mg | ORAL_CAPSULE | Freq: Two times a day (BID) | ORAL | Status: DC | PRN
  Administered 2023-10-17 (×2): 2 mg via ORAL
  Filled 2023-10-17 (×2): qty 1

## 2023-10-17 NOTE — Progress Notes (Signed)
 Speech Language Pathology Treatment: Dysphagia  Patient Details Name: Lori Gates MRN: [id number] DOB: November 28, 1955 Today's Date: 10/17/2023 Time: 1478-2956 SLP Time Calculation (min) (ACUTE ONLY): 21 min  Assessment / Plan / Recommendation Clinical Impression  Pt seen for dysphagia f/u tx session with progression of liquids to nectar-thickened with increased speed of swallow/efficiency and improved cognitive status as pt was alert/interactive during entire session.  Initially, pt refused po intake, but with encouragement agreed to eat graham crackers and consume 5-6 tsp of nectar-thickened juice.  Post intake, she exhibited a wet vocal quality and delayed cough after consuming both consistencies, so unsure of catalyst for s/s of potential aspiration, but likely nectar-thick liquids.  Pt may benefit from repeat MBS prior to d/c to progress liquids, but continued mechanical soft consistency recommended d/t endentulous state/impaired cognition, and hx of VF paralysis for safety reasons.  Continue nectar-thick liquid trials/diet tolerance/education and/or objective assessment with ST in further sessions in acute setting.  HPI HPI: Lori Gates is a 68 year old female who presented to ED after daughter found on floor actively seizing, CBG read "high". Continued to seize from time of EMS dispatch to arrival to ER, and was intubated for airway protection. ETT 4/29-5/4. 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 "Stable right basilar atelectasis or infiltrate is noted." ENT scoped 5/6 "with no movement seen on the right side (suspect R VF paralysis), left VF with a large hemorrhagic polyp along left vocal fold." S/p VF surgery with Dr Larkin Plumb 5/7. Pt with poorly controlled IDDM, CKD stage IV, HTN, LLD, HFpEF, substance abuse (cocaine) followed by nephrology in [place].      SLP Plan   Continue with current plan of care;New goals to be determined pending instrumental study      Recommendations for follow up therapy are one component of a multi-disciplinary discharge planning process, led by the attending physician.  Recommendations may be updated based on patient status, additional functional criteria and insurance authorization.    Recommendations  Diet recommendations: Dysphagia 3 (mechanical soft);Other(comment);Nectar-thick liquid Liquids provided via: Cup Medication Administration: Whole meds with puree Supervision: Intermittent supervision to cue for compensatory strategies Compensations: Minimize environmental distractions;Slow rate;Small sips/bites Postural Changes and/or Swallow Maneuvers: Seated upright 90 degrees                  Oral care BID   Frequent or constant Supervision/Assistance Dysphagia, oropharyngeal phase (R13.12)     Continue with current plan of care;New goals to be determined pending instrumental study     Pat Rajvir Ernster,M.S.,CCC-SLP  10/17/2023, 2:01 PM

## 2023-10-17 NOTE — TOC Progression Note (Addendum)
 Transition of Care [facility]) - Progression Note    Patient Details  Name: Lori Gates MRN: [id number] Date of Birth: 09-16-1955  [facility]) CM/SW Contact  Zayyan Mullen A [place], LCSW Phone Number: 10/17/2023, 11:03 AM  Clinical Narrative:     Update 1520 [facility] notified CSW that pt needs a new authorization. Facility to start re-auth once updated notes from acute therapy team.    CSW was notified by provider that pt may be stable for DC tomorrow. CSW notified [facility] of information, waiting to hear back regarding authorization still approved or need to restart request and bed availability.    TOC will continue to follow.    Expected Discharge Plan: IP Rehab Facility Barriers to Discharge: Continued Medical Work up  Expected Discharge Plan and Services                                               Social Determinants of Health (SDOH) Interventions SDOH Screenings   Food Insecurity: Patient Unable To Answer (09/25/2023)  Recent Concern: Food Insecurity - Food Insecurity Present (07/18/2023)   Received from [facility]  Housing: Unknown (09/25/2023)  Transportation Needs: Patient Unable To Answer (09/25/2023)  Recent Concern: Transportation Needs - Unmet Transportation Needs (07/18/2023)   Received [facility]  Utilities: Patient Unable To Answer (09/25/2023)  Financial Resource Strain: Medium Risk (07/18/2023)   Received from [facility]  Physical Activity: Inactive (07/18/2023)   Received from [facility]  Social Connections: Patient Unable To Answer (09/25/2023)  Tobacco Use: Medium Risk (09/24/2023)  Health Literacy: High Risk (07/18/2023)   Received from [facility]    Readmission Risk Interventions     No data to display

## 2023-10-17 NOTE — Progress Notes (Signed)
 Physical Therapy Treatment Patient Details Name: Lori Gates MRN: [id number] DOB: 1955/07/02 Today's Date: 10/17/2023   History of Present Illness 68 y.o. female admitted 09/24/23 after being found down, unresponsive, seizure and hyperglycemia; UDS (+) cocaine, benzos. Workup for DKA, seizures, UTI, sepsis, aspiration PNA vs pneumonitis. 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 ETT 4/29-5/2. iHD initiated 5/1. S/p microlaryngoscopy with excision of L true vocal cord polyp 5/7; ETT 5/7-5/8. [name] PMH includes asthma, DM2, HTN.    PT Comments  Pt received in supine, c/o fatigue after getting back to bed from chair ~1 hour prior (with nursing assist), pt agreeable to participating in session with heavy encouragement. Pt noted to have soiled bed linens, and needed encouragement and assist for completion of peri-care but pt with much improved standing/activity tolerance this date with encouragement from therapists. Pt given close chair follow for safety due to mild impulsivity and concern for possible breakthrough seizure activity vs encephalopathy waxing/waning, see gait comments, RN and MD notified. Pt with intermittent increased dysarthria and disorganized speech, as well as pauses with repetitive limb movements when speech disorganization increases, that tends to be less apparent when pt resting. Pt will continue to benefit from skilled rehab in a post acute setting to maximize functional gains before returning home.     If plan is discharge home, recommend the following: Two people to help with bathing/dressing/bathroom;Assistance with cooking/housework;Direct supervision/assist for medications management;Direct supervision/assist for financial management;Assist for transportation;Help with stairs or ramp for entrance;Supervision due to cognitive status;Two people to help with walking and/or transfers   Can travel by private vehicle     No   Equipment Recommendations  Other (comment) (TBD - potential walker, wheelchair with pressure cushion, BSC)    Recommendations for Other Services       Precautions / Restrictions Precautions Precautions: Fall;Other (comment) Recall of Precautions/Restrictions: Impaired Precaution/Restrictions Comments: bladder/bowel incontinence, LUE edema (pt states this is chronic), sacral pressure sore Restrictions Weight Bearing Restrictions Per Provider Order: No     Mobility  Bed Mobility Overal bed mobility: Needs Assistance Bed Mobility: Rolling, Sidelying to Sit, Sit to Sidelying Rolling: Supervision, Used rails Sidelying to sit: HOB elevated, Used rails, Contact guard assist     Sit to sidelying: HOB elevated, Contact guard assist, Used rails General bed mobility comments: to R EOB; bed linens observed to be soiled as pt sitting up to EOB. Heavy encouragement for pt to initiate.    Transfers Overall transfer level: Needs assistance Equipment used: Rolling walker (2 wheels) Transfers: Sit to/from Stand, Bed to chair/wheelchair/BSC Sit to Stand: Min assist           General transfer comment: from EOB and toilet heights with use of BUE support to push up and pt using wall rail to pull up from lower toilet seat with PTA encouragement    Ambulation/Gait Ambulation/Gait assistance: Min assist, Mod assist, +2 safety/equipment Gait Distance (Feet): 15 Feet (40ft to sink, seated break on BSC, 86ft to toilet from sink, then ~73ft back to EOB) Assistive device: Rolling walker (2 wheels) Gait Pattern/deviations: Step-through pattern, Decreased stride length, Trunk flexed       General Gait Details: Frequent pause type breaks and some repetitive movements of LLE (example: pt stepping a few feet then forefoot appears "stuck" on ground with pt heel bobbing up/down as pt going from stance phase to swing phase with RLE, MD notified after session). "pauses" appear to coincide with increase in  disorganized speech  and increased dysarthria, RN and MD notified. Pt then able to continue with stepping/return to supine. Similar occurrence when pt preparing to transfer from supine to EOB with BLE shaking/knees flexing and extending repetitively a few times prior to pt continuing transfer to EOB. Pt mostly needing minA for safety/stability with forward stepping but at times needs assist for proximity to RW and a couple episodes of modA when turning and due to initial imbalance.   Stairs             Wheelchair Mobility     Tilt Bed    Modified Rankin (Stroke Patients Only) Modified Rankin (Stroke Patients Only) Pre-Morbid Rankin Score: Moderate disability Modified Rankin: Severe disability     Balance Overall balance assessment: Needs assistance Sitting-balance support: No upper extremity supported, Feet unsupported Sitting balance-Leahy Scale: Poor Sitting balance - Comments: appears fair but pt can be impulsive to flex forward at trunk or to the side, needs close Supervision for safety   Standing balance support: Bilateral upper extremity supported, Reliant on assistive device for balance, During functional activity Standing balance-Leahy Scale: Poor Standing balance comment: CGA standing at sink and mostly minA external assist when supported by RW                            Communication Communication Communication: Impaired Factors Affecting Communication: Reduced clarity of speech;Difficulty expressing self;Other (comment) (dysarthric)  Cognition Arousal: Alert Behavior During Therapy: Restless, Impulsive   PT - Cognitive impairments: No family/caregiver present to determine baseline, Attention, Initiation, Sequencing, Problem solving, Safety/Judgement, Memory, Orientation Difficult to assess due to: Impaired communication Orientation impairments: Place, Situation, Time                   PT - Cognition Comments: Pt aware she is having some memory  deficits, no family present to determine her baseline, but pt more participatory after discussion on plan for session and pt assistance to get cleaned up with notable soiling on bed/gown when staff arrived to her room. Pt was appreciative of warm blankets to encourage her to sit in chair comfortably but once pt positioned in recliner, pt becomes restless again and frustrated by posture due to c/o bottom pain, RN and NT notified pt may need to get back to bed soon but needs new sheets. Following commands: Impaired Following commands impaired: Follows one step commands with increased time, Only follows one step commands consistently    Cueing Cueing Techniques: Verbal cues, Gestural cues, Tactile cues  Exercises      General Comments General comments (skin integrity, edema, etc.): pt with frequent loose pale colored stools, RN aware; needs assist for hygiene thoroughness but pt able to perform some peri-care in standing with CGA to minA external support; needs reminders to wash her hands      Pertinent Vitals/Pain Pain Assessment Pain Assessment: PAINAD Breathing: normal Negative Vocalization: none Facial Expression: sad, frightened, frown Body Language: relaxed Consolability: no need to console PAINAD Score: 1 Pain Location: no specific complaint today Pain Descriptors / Indicators: Grimacing Pain Intervention(s): Monitored during session, Repositioned    Home Living                          Prior Function            PT Goals (current goals can now be found in the care plan section) Acute Rehab PT Goals Patient Stated Goal: to go  home PT Goal Formulation: With patient Time For Goal Achievement: 10/14/23 (supervising PT notified) Progress towards PT goals: Progressing toward goals    Frequency    Min 2X/week      PT Plan      Co-evaluation PT/OT/SLP Co-Evaluation/Treatment: Yes Reason for Co-Treatment: Complexity of the patient's impairments (multi-system  involvement);Necessary to address cognition/behavior during functional activity;For patient/therapist safety;To address functional/ADL transfers PT goals addressed during session: Mobility/safety with mobility;Balance;Proper use of DME        AM-PAC PT "6 Clicks" Mobility   Outcome Measure  Help needed turning from your back to your side while in a flat bed without using bedrails?: A Little Help needed moving from lying on your back to sitting on the side of a flat bed without using bedrails?: A Lot (w/o rail) Help needed moving to and from a bed to a chair (including a wheelchair)?: A Lot Help needed standing up from a chair using your arms (e.g., wheelchair or bedside chair)?: A Lot Help needed to walk in hospital room?: A Lot (chair follow) Help needed climbing 3-5 steps with a railing? : Total 6 Click Score: 12    End of Session Equipment Utilized During Treatment: Gait belt Activity Tolerance: Patient tolerated treatment well Patient left: with call bell/phone within reach;Other (comment);in bed;with bed alarm set (HOB at 50 deg as pt wanting to drink her thickened water) Nurse Communication: Mobility status;Other (comment);Precautions (loose stools, PTA and OT assisted her with hygiene; possible breakthrough seizure activity during mobility? MD also notified) PT Visit Diagnosis: Muscle weakness (generalized) (M62.81);Unsteadiness on feet (R26.81);Other abnormalities of gait and mobility (R26.89)     Time: 1411-1444 PT Time Calculation (min) (ACUTE ONLY): 33 min  Charges:    $Gait Training: 8-22 mins PT General Charges $$ ACUTE PT VISIT: 1 Visit                     Winslow Ederer P., PTA Acute Rehabilitation Services Secure Chat Preferred 9a-5:30pm Office: [phone]    Mariel Shope [facility] 10/17/2023, 3:26 PM

## 2023-10-17 NOTE — Plan of Care (Signed)
  Problem: Education: Goal: Knowledge of General Education information will improve Description: Including pain rating scale, medication(s)/side effects and non-pharmacologic comfort measures Outcome: Progressing   Problem: Health Behavior/Discharge Planning: Goal: Ability to manage health-related needs will improve Outcome: Progressing   Problem: Clinical Measurements: Goal: Ability to maintain clinical measurements within normal limits will improve Outcome: Progressing Goal: Will remain free from infection Outcome: Progressing Goal: Diagnostic test results will improve Outcome: Progressing Goal: Respiratory complications will improve Outcome: Progressing Goal: Cardiovascular complication will be avoided Outcome: Progressing   Problem: Activity: Goal: Risk for activity intolerance will decrease Outcome: Progressing   Problem: Nutrition: Goal: Adequate nutrition will be maintained Outcome: Progressing   Problem: Coping: Goal: Level of anxiety will decrease Outcome: Progressing   Problem: Elimination: Goal: Will not experience complications related to bowel motility Outcome: Progressing Goal: Will not experience complications related to urinary retention Outcome: Progressing   Problem: Pain Managment: Goal: General experience of comfort will improve and/or be controlled Outcome: Progressing   Problem: Safety: Goal: Ability to remain free from injury will improve Outcome: Progressing   Problem: Skin Integrity: Goal: Risk for impaired skin integrity will decrease Outcome: Progressing   Problem: Education: Goal: Ability to describe self-care measures that may prevent or decrease complications (Diabetes Survival Skills Education) will improve Outcome: Progressing Goal: Individualized Educational Video(s) Outcome: Progressing   Problem: Coping: Goal: Ability to adjust to condition or change in health will improve Outcome: Progressing   Problem: Fluid  Volume: Goal: Ability to maintain a balanced intake and output will improve Outcome: Progressing   Problem: Health Behavior/Discharge Planning: Goal: Ability to identify and utilize available resources and services will improve Outcome: Progressing Goal: Ability to manage health-related needs will improve Outcome: Progressing   Problem: Metabolic: Goal: Ability to maintain appropriate glucose levels will improve Outcome: Progressing   Problem: Nutritional: Goal: Maintenance of adequate nutrition will improve Outcome: Progressing Goal: Progress toward achieving an optimal weight will improve Outcome: Progressing   Problem: Skin Integrity: Goal: Risk for impaired skin integrity will decrease Outcome: Progressing   Problem: Tissue Perfusion: Goal: Adequacy of tissue perfusion will improve Outcome: Progressing   Problem: Activity: Goal: Ability to tolerate increased activity will improve Outcome: Progressing   Problem: Respiratory: Goal: Ability to maintain a clear airway and adequate ventilation will improve Outcome: Progressing   Problem: Role Relationship: Goal: Method of communication will improve Outcome: Progressing   Problem: Education: Goal: Ability to describe self-care measures that may prevent or decrease complications (Diabetes Survival Skills Education) will improve Outcome: Progressing Goal: Individualized Educational Video(s) Outcome: Progressing   Problem: Cardiac: Goal: Ability to maintain an adequate cardiac output will improve Outcome: Progressing   Problem: Health Behavior/Discharge Planning: Goal: Ability to identify and utilize available resources and services will improve Outcome: Progressing Goal: Ability to manage health-related needs will improve Outcome: Progressing   Problem: Fluid Volume: Goal: Ability to achieve a balanced intake and output will improve Outcome: Progressing   Problem: Metabolic: Goal: Ability to maintain  appropriate glucose levels will improve Outcome: Progressing   Problem: Nutritional: Goal: Maintenance of adequate nutrition will improve Outcome: Progressing Goal: Maintenance of adequate weight for body size and type will improve Outcome: Progressing   Problem: Respiratory: Goal: Will regain and/or maintain adequate ventilation Outcome: Progressing   Problem: Urinary Elimination: Goal: Ability to achieve and maintain adequate renal perfusion and functioning will improve Outcome: Progressing

## 2023-10-17 NOTE — Progress Notes (Signed)
 Occupational Therapy Treatment Patient Details Name: Lori Gates MRN: [id number] DOB: 1956/05/28 Today's Date: 10/17/2023   History of present illness 68 y.o. female admitted 09/24/23 after being found down, unresponsive, seizure and hyperglycemia; UDS (+) cocaine, benzos. Workup for DKA, seizures, UTI, sepsis, aspiration PNA vs pneumonitis. 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 ETT 4/29-5/2. iHD initiated 5/1. S/p microlaryngoscopy with excision of L true vocal cord polyp 5/7; ETT 5/7-5/8. Lori Gates PMH includes asthma, DM2, HTN.   OT comments  Pt open to perform mobility and ADLs with encouragement today, completing bed mobility with CGA and ambulating min A + RW, moments/pauses when OOB where pt is unable to advance BLEs. Pt requires cues for thoroughness with grooming/hygiene but was able to perform some at the sink with mod A to CGA. Pt fatigued with room level activity, OT to continue working with pt to progress activity tolerance and ADL deficits. Patient will benefit from continued inpatient follow up therapy, <3 hours/day       If plan is discharge home, recommend the following:  Assistance with feeding;Assistance with cooking/housework;Direct supervision/assist for medications management;Direct supervision/assist for financial management;Assist for transportation;Help with stairs or ramp for entrance;Supervision due to cognitive status;A lot of help with bathing/dressing/bathroom;A lot of help with walking and/or transfers   Equipment Recommendations  BSC/3in1    Recommendations for Other Services      Precautions / Restrictions Precautions Precautions: Fall;Other (comment) Recall of Precautions/Restrictions: Impaired Precaution/Restrictions Comments: bladder/bowel incontinence, LUE edema (pt states this is chronic), sacral pressure sore Restrictions Weight Bearing Restrictions Per Provider Order: No       Mobility Bed  Mobility Overal bed mobility: Needs Assistance Bed Mobility: Rolling, Sidelying to Sit, Sit to Sidelying Rolling: Supervision, Used rails Sidelying to sit: HOB elevated, Used rails, Contact guard assist     Sit to sidelying: HOB elevated, Contact guard assist, Used rails General bed mobility comments: to R EOB; bed linens observed to be soiled as pt sitting up to EOB. Heavy encouragement for pt to initiate.    Transfers Overall transfer level: Needs assistance Equipment used: Rolling walker (2 wheels) Transfers: Sit to/from Stand Sit to Stand: Min assist                 Balance Overall balance assessment: Needs assistance Sitting-balance support: No upper extremity supported, Feet unsupported Sitting balance-Leahy Scale: Poor Sitting balance - Comments: appears fair but pt can be impulsive to flex forward at trunk or to the side, needs close Supervision for safety Postural control:  (forward lean) Standing balance support: Bilateral upper extremity supported, Reliant on assistive device for balance, During functional activity Standing balance-Leahy Scale: Poor Standing balance comment: CGA standing at sink and mostly minA external assist when supported by RW                           ADL either performed or assessed with clinical judgement   ADL     Eating/Feeding Details (indicate cue type and reason): nectar thick Grooming: Standing;Wash/dry face;Wash/dry hands;Contact guard assist Grooming Details (indicate cue type and reason): cues for thoroughness                 Toilet Transfer: Minimal assistance;Rolling walker (2 wheels)   Toileting- Clothing Manipulation and Hygiene: Moderate assistance;Sit to/from stand Toileting - Clothing Manipulation Details (indicate cue type and reason): mod A for standing rear pericare, wipes front without challenge.  Functional mobility during ADLs: Rolling walker (2 wheels);Minimal assistance       Extremity/Trunk Assessment              Vision       Perception     Praxis     Communication Communication Communication: Impaired Factors Affecting Communication: Reduced clarity of speech;Difficulty expressing self;Other (comment) (dysarthric)   Cognition Arousal: Alert Behavior During Therapy: Restless, Impulsive       Awareness: Intellectual awareness impaired, Online awareness impaired Memory impairment (select all impairments): Working [employer] fast streamer, Armed forces training and education officer functioning impairment (select all impairments): Reasoning, Problem solving                   Following commands: Impaired Following commands impaired: Follows one step commands with increased time, Only follows one step commands consistently      Cueing   Cueing Techniques: Verbal cues, Gestural cues, Tactile cues  Exercises      Shoulder Instructions       General Comments pt with frequent loose pale colored stools, RN aware; needs assist for hygiene thoroughness but pt able to perform some peri-care in standing with CGA to minA external support; needs reminders to wash her hands    Pertinent Vitals/ Pain       Pain Assessment Pain Assessment: PAINAD Breathing: normal Negative Vocalization: none Facial Expression: sad, frightened, frown Body Language: relaxed Consolability: no need to console PAINAD Score: 1 Pain Location: no specific complaint today Pain Descriptors / Indicators: Grimacing Pain Intervention(s): Monitored during session  Home Living                                          Prior Functioning/Environment              Frequency  Min 2X/week        Progress Toward Goals  OT Goals(current goals can now be found in the care plan section)  Progress towards OT goals: Progressing toward goals  Acute Rehab OT Goals Patient Stated Goal: to go home OT Goal Formulation: With patient Time For Goal Achievement:  10/31/23 Potential to Achieve Goals: Good ADL Goals Pt Will Perform Grooming: standing;with supervision Pt Will Perform Upper Body Bathing: standing;with set-up;with supervision Pt Will Perform Lower Body Bathing: sit to/from stand;with contact guard assist Pt Will Perform Lower Body Dressing: sit to/from stand;with supervision Pt Will Transfer to Toilet: with supervision;ambulating  Plan      Co-evaluation      Reason for Co-Treatment: Complexity of the patient's impairments (multi-system involvement);Necessary to address cognition/behavior during functional activity;For patient/therapist safety;To address functional/ADL transfers PT goals addressed during session: Mobility/safety with mobility;Balance;Proper use of DME OT goals addressed during session: ADL's and self-care;Proper use of Adaptive equipment and DME      AM-PAC OT "6 Clicks" Daily Activity     Outcome Measure   Help from another person eating meals?: None Help from another person taking care of personal grooming?: A Little Help from another person toileting, which includes using toliet, bedpan, or urinal?: A Lot Help from another person bathing (including washing, rinsing, drying)?: A Lot Help from another person to put on and taking off regular upper body clothing?: A Little Help from another person to put on and taking off regular lower body clothing?: A Lot 6 Click Score: 16    End of Session Equipment Utilized During Treatment: Gait  belt;Rolling walker (2 wheels)  OT Visit Diagnosis: Unsteadiness on feet (R26.81);Muscle weakness (generalized) (M62.81);History of falling (Z91.81);Other symptoms and signs involving cognitive function;Other abnormalities of gait and mobility (R26.89)   Activity Tolerance Patient tolerated treatment well   Patient Left in bed;with call bell/phone within reach;with bed alarm set   Nurse Communication Mobility status        Time: 2956-2130 OT Time Calculation (min): 31  min  Charges: OT General Charges $OT Visit: 1 Visit OT Treatments $Therapeutic Activity: 8-22 mins  10/17/2023  AB, OTR/L  Acute Rehabilitation Services  Office: [phone]   Jorene New 10/17/2023, 3:45 PM

## 2023-10-17 NOTE — Progress Notes (Signed)
 PROGRESS NOTE    Lori Gates  ZOX:[id number] DOB: 1956-01-05 DOA: 09/24/2023 PCP: Default, Provider, MD   Brief Narrative:  This 68 year old  Female with uncontrolled DM2, CKD stage IV, HTN, diastolic CHF, and cocaine abuse who was admitted to the hospital on 4/29 after she was found down on the floor unresponsive and seizing and ultimately diagnosed with DKA with seizure activity.  She required endotracheal intubation upon arrival to the ER, and was admitted to the ICU.  CT head was negative for acute bleeding.  MRI brain was negative for acute process. After correction of her DKA she was able to be extubated but suffered with stridor post-extubation.  ENT was consulted and the patient was found to have a left vocal cord polyp.  She was taken to the OR by ENT with excision of the polyp and required approximately 12 hours of rest on the ventilator postoperatively before she could be re-extubated. TRH Pick up 5/14  Assessment & Plan:   Principal Problem:   Seizure (HCC) Active Problems:   Acute encephalopathy   Acute hypercapnic respiratory failure (HCC)   Protein-calorie malnutrition, severe   Stridor   Vocal cord paralysis   Vocal cord polyp   DKA with coma , uncontrolled type 2 diabetes: Hb A1c 14 - DKA resolved - CBG now well-controlled with adjustments made in insulin  regimen during this admission. Glucoses are still uncontrolled.  Continue lantus  to 20 units daily.  Diabetic coordinator consulted.   Acute hypoxic and hypercapnic respiratory failure: Due to seizure activity and DKA - resolved. Now patient stable on room air.   Aspiration pneumonia versus pneumonitis: Has completed a 7-day course of empiric antibiotic therapy . No persisting symptoms to suggest active infection.   Post extubation stridor with right vocal fold paralysis and large left hemorrhagic polyp of the vocal fold. Status post polypectomy by ENT 5/7  ENT to consider repeat flexible scope exam to  re-evaluate her right vocal cord - if her vocal cord remains inoperable,  She will need a contrasted CT of the neck and chest to evaluate her recurrent laryngeal nerve - presently it is not felt wise to administer IV contrast while renal function is recovering.  will need follow up timing of this with ENT (secure chat sent by Dr. Ada Acres -> plan is for this to be outpt when she recovers)?   Metabolic encephalopathy likely multifactorial: It could be due to DKA and toxic encephalopathy due to cocaine Mental status slow to improve - waxing/waning TSH elevated but free T4 normal (follow outpatient) B12 is normal , Folic acid  is low at 5.5  MRI with punctate focus of acute/early/subacute infarct . Neurology did not think it needed further chasing "as it is either an incidental stroke finding due to small vessel disease or it could be secondary to seizure". Repeat EEG without seizures or epileptiform discharges.  This may all be delirium?   Delirium precautions.  Will monitor, workup additionally prn.   Neurology thinks patient is back to her baseline mental status.   Seizure with postictal state Incited by DKA - continue Keppra . Reduce Keppra  to 250 mg twice daily as patient remains seizure-free.   Acute kidney injury on CKD stage IV Required HD during initial portion of hospital stay. Nephrology has assisted in her care- baseline creatinine approximately 2.5-2.8  HD cath removed 5/10  Creatinine up to 3.09 today (BUN 75), will trend - not far off from estimated baseline.  Consider renal consult if significant worsening.  Plan  for follow up in [place] in 3-4 weeks Renal US  without hydronephrosis.   Normocytic anemia Folic acid  deficiency Hb has downtrended gradually during her hospitalization. Hb 12.7 at admission (not sure how accurate this was, ? Hemoconcentration in setting of DKA) -> has primarily been 8-10 recently.  Now Hb in 7's. No si/sx bleeding, will monitor.  Follow iron (low  normal), b12 (wnl), ferritin (wnl) Folate supplementation   Paroxysmal atrial fibrillation NSR at present.  Heart rate controlled.   Uncontrolled HTN: Continue Bidil , bystolic , torsemide  Adjust regimen prn, fluctuating   HLD Continue Lipitor    Chronic diastolic CHF Appears euvolemic    Cocaine abuse Encourage cessation    Acute Superficial Vein Thrombosis of L Basilic Vein  Noted 4/30      DVT prophylaxis: Heparin  Code Status: Full code Family Communication: No family at bedside. Disposition Plan: SNF when medically ready  Consultants:  Neurology  Procedures:  Antimicrobials: Anti-infectives (From admission, onward)    Start     Dose/Rate Route Frequency Ordered Stop   09/25/23 1800  cefTRIAXone  (ROCEPHIN ) 2 g in sodium chloride  0.9 % 100 mL IVPB        2 g 200 mL/hr over 30 Minutes Intravenous Every 24 hours 09/25/23 0015 09/29/23 1833   09/24/23 1915  cefTRIAXone  (ROCEPHIN ) 1 g in sodium chloride  0.9 % 100 mL IVPB        1 g 200 mL/hr over 30 Minutes Intravenous  Once 09/24/23 1902 09/24/23 2010       Subjective: Patient was seen and examined at bedside.  Overnight events noted. Patient reports feeling better denies any chest pain or shortness of breath.  Still reports having throat irritation.  Objective: Vitals:   10/16/23 2148 10/17/23 0500 10/17/23 0613 10/17/23 0740  BP: 136/63  (!) 147/67 (!) 146/71  Pulse: 66  64 62  Resp: 16  16 18   Temp: 98.5 F (36.9 C)  97.7 F (36.5 C) 97.6 F (36.4 C)  TempSrc:    Oral  SpO2: 95%  99%   Weight:  68 kg    Height:        Intake/Output Summary (Last 24 hours) at 10/17/2023 1137 Last data filed at 10/16/2023 2106 Gross per 24 hour  Intake 237 ml  Output --  Net 237 ml   Filed Weights   10/15/23 0711 10/16/23 0549 10/17/23 0500  Weight: 62.7 kg 62.7 kg 68 kg    Examination:  General exam: Appears comfortable, severely deconditioned, not in any acute distress. Respiratory system: CTA Bilaterally  . Respiratory effort normal.  RR 15 Cardiovascular system: S1 & S2 heard, RRR. No JVD, murmurs, rubs, gallops or clicks. Gastrointestinal system: Abdomen is non distended, soft and non tender. No organomegaly or masses felt. Normal bowel sounds heard. Central nervous system: Alert and oriented x 2. No focal neurological deficits.  Stuttering speech Extremities: No edema, no cyanosis, no clubbing Skin: No rashes, lesions or ulcers Psychiatry: . Mood & affect appropriate.   Data Reviewed: I have personally reviewed following labs and imaging studies  CBC: Recent Labs  Lab 10/12/23 0651 10/13/23 0949 10/14/23 0517 10/14/23 0822 10/14/23 1626 10/15/23 0848  WBC 6.0 5.1 5.2 5.5  --  4.9  NEUTROABS 2.9 2.8 2.9  --   --  2.5  HGB 7.2* 7.0* 6.5* 7.6* 7.2* 7.9*  HCT 22.3* 22.8* 20.8* 24.3* 23.7* 25.5*  MCV 98.2 98.7 99.0 99.2  --  99.2  PLT 225 228 211 240  --  263  Basic Metabolic Panel: Recent Labs  Lab 10/12/23 0651 10/13/23 0949 10/14/23 0517 10/15/23 0848  NA 145 141 141 145  K 4.8 4.8 5.0 4.7  CL 108 109 110 109  CO2 23 23 25 26   GLUCOSE 168* 187* 174* 132*  BUN 63* 65* 73* 75*  CREATININE 2.64* 2.93* 3.07* 3.09*  CALCIUM  8.3* 8.1* 8.2* 8.8*  MG  --   --  1.9 2.1  PHOS  --   --  5.0* 5.4*   GFR: Estimated Creatinine Clearance: 15.7 mL/min (A) (by C-G formula based on SCr of 3.09 mg/dL (H)). Liver Function Tests: Recent Labs  Lab 10/14/23 0517 10/15/23 0848  AST 103* 67*  ALT 71* 73*  ALKPHOS 264* 323*  BILITOT 0.5 0.6  PROT 5.6* 6.8  ALBUMIN 2.1* 2.6*   No results for input(s): "LIPASE", "AMYLASE" in the last 168 hours. No results for input(s): "AMMONIA" in the last 168 hours. Coagulation Profile: No results for input(s): "INR", "PROTIME" in the last 168 hours. Cardiac Enzymes: No results for input(s): "CKTOTAL", "CKMB", "CKMBINDEX", "TROPONINI" in the last 168 hours. BNP (last 3 results) No results for input(s): "PROBNP" in the last 8760  hours. HbA1C: No results for input(s): "HGBA1C" in the last 72 hours. CBG: Recent Labs  Lab 10/16/23 1302 10/16/23 1722 10/16/23 2150 10/17/23 0735 10/17/23 0814  GLUCAP 178* 200* 103* 61* 80   Lipid Profile: No results for input(s): "CHOL", "HDL", "LDLCALC", "TRIG", "CHOLHDL", "LDLDIRECT" in the last 72 hours. Thyroid Function Tests: No results for input(s): "TSH", "T4TOTAL", "FREET4", "T3FREE", "THYROIDAB" in the last 72 hours. Anemia Panel: Recent Labs    10/15/23 0848  VITAMINB12 683  FOLATE 24.7  FERRITIN 63  TIBC 357  IRON 36   Sepsis Labs: No results for input(s): "PROCALCITON", "LATICACIDVEN" in the last 168 hours.  No results found for this or any previous visit (from the past 240 hours).   Radiology Studies: No results found.  Scheduled Meds:  atorvastatin   80 mg Oral Daily   Chlorhexidine  Gluconate Cloth  6 each Topical Daily   docusate sodium   100 mg Oral BID   feeding supplement (GLUCERNA SHAKE)  237 mL Oral TID BM   folic acid   1 mg Oral Daily   heparin   5,000 Units Subcutaneous Q8H   insulin  aspart  0-20 Units Subcutaneous TID WC   insulin  aspart  0-5 Units Subcutaneous QHS   insulin  glargine-yfgn  20 Units Subcutaneous Daily   isosorbide -hydrALAZINE   1.5 tablet Oral TID   levETIRAcetam   250 mg Oral BID   multivitamin with minerals  1 tablet Oral Daily   nebivolol   7.5 mg Oral Daily   mouth rinse  15 mL Mouth Rinse 4 times per day   pantoprazole   40 mg Oral Daily   polyethylene glycol  17 g Oral Daily   QUEtiapine   12.5 mg Oral QHS   sodium chloride  flush  10-40 mL Intracatheter Q12H   thiamine   100 mg Oral Daily   torsemide   20 mg Oral Daily   Continuous Infusions:   LOS: 23 days    Time spent: 35 mins    Magdalene School, MD [facility]   If 7PM-7AM, please contact night-coverage

## 2023-10-18 DIAGNOSIS — R569 Unspecified convulsions: Secondary | ICD-10-CM | POA: Diagnosis not present

## 2023-10-18 LAB — BPAM RBC
Blood Product Expiration Date: 202505242359
Unit Type and Rh: 5100

## 2023-10-18 LAB — CBC
HCT: 23.6 % — ABNORMAL LOW (ref 36.0–46.0)
Hemoglobin: 7.2 g/dL — ABNORMAL LOW (ref 12.0–15.0)
MCH: 30.4 pg (ref 26.0–34.0)
MCHC: 30.5 g/dL (ref 30.0–36.0)
MCV: 99.6 fL (ref 80.0–100.0)
Platelets: 227 K/uL (ref 150–400)
RBC: 2.37 MIL/uL — ABNORMAL LOW (ref 3.87–5.11)
RDW: 13.4 % (ref 11.5–15.5)
WBC: 4.9 K/uL (ref 4.0–10.5)
nRBC: 0 % (ref 0.0–0.2)

## 2023-10-18 LAB — BASIC METABOLIC PANEL WITH GFR
Anion gap: 9 (ref 5–15)
BUN: 85 mg/dL — ABNORMAL HIGH (ref 8–23)
CO2: 24 mmol/L (ref 22–32)
Calcium: 8.6 mg/dL — ABNORMAL LOW (ref 8.9–10.3)
Chloride: 109 mmol/L (ref 98–111)
Creatinine, Ser: 3.29 mg/dL — ABNORMAL HIGH (ref 0.44–1.00)
GFR, Estimated: 15 mL/min — ABNORMAL LOW (ref >60)
Glucose, Bld: 87 mg/dL (ref 70–99)
Potassium: 4.6 mmol/L (ref 3.5–5.1)
Sodium: 142 mmol/L (ref 135–145)

## 2023-10-18 LAB — TYPE AND SCREEN
ABO/RH(D): O POS
Antibody Screen: NEGATIVE
Unit division: 0

## 2023-10-18 LAB — HEMOGLOBIN AND HEMATOCRIT, BLOOD
HCT: 23.6 % — ABNORMAL LOW (ref 36.0–46.0)
Hemoglobin: 7.4 g/dL — ABNORMAL LOW (ref 12.0–15.0)

## 2023-10-18 LAB — GLUCOSE, CAPILLARY
Glucose-Capillary: 113 mg/dL — ABNORMAL HIGH (ref 70–99)
Glucose-Capillary: 254 mg/dL — ABNORMAL HIGH (ref 70–99)
Glucose-Capillary: 212 mg/dL — ABNORMAL HIGH (ref 70–99)
Glucose-Capillary: 141 mg/dL — ABNORMAL HIGH (ref 70–99)

## 2023-10-18 LAB — PHOSPHORUS: Phosphorus: 6.4 mg/dL — ABNORMAL HIGH (ref 2.5–4.6)

## 2023-10-18 LAB — MAGNESIUM: Magnesium: 2.2 mg/dL (ref 1.7–2.4)

## 2023-10-18 MED ORDER — VITAMIN C 500 MG PO TABS
500.0000 mg | ORAL_TABLET | Freq: Two times a day (BID) | ORAL | Status: DC
  Administered 2023-10-18 – 2023-10-19 (×3): 500 mg via ORAL
  Filled 2023-10-18 (×3): qty 1

## 2023-10-18 MED ORDER — ZINC SULFATE 220 (50 ZN) MG PO CAPS
220.0000 mg | ORAL_CAPSULE | Freq: Every day | ORAL | Status: DC
  Administered 2023-10-18 – 2023-10-19 (×2): 220 mg via ORAL
  Filled 2023-10-18 (×2): qty 1

## 2023-10-18 NOTE — Plan of Care (Signed)
  Problem: Education: Goal: Knowledge of General Education information will improve Description: Including pain rating scale, medication(s)/side effects and non-pharmacologic comfort measures Outcome: Progressing   Problem: Clinical Measurements: Goal: Ability to maintain clinical measurements within normal limits will improve Outcome: Progressing   Problem: Activity: Goal: Risk for activity intolerance will decrease Outcome: Progressing   Problem: Nutrition: Goal: Adequate nutrition will be maintained Outcome: Progressing   Problem: Elimination: Goal: Will not experience complications related to bowel motility Outcome: Progressing   Problem: Safety: Goal: Ability to remain free from injury will improve Outcome: Progressing   Problem: Skin Integrity: Goal: Risk for impaired skin integrity will decrease Outcome: Progressing   

## 2023-10-18 NOTE — TOC Progression Note (Addendum)
 Transition of Care [facility]) - Progression Note    Patient Details  Name: Lori Gates MRN: [id number] Date of Birth: Feb 21, 1956  [facility] CM/SW Contact  Pleasant Britz A [place], LCSW Phone Number: 10/18/2023, 2:11 PM  Clinical Narrative:     Update 1439: CSW was notified by [facility] that pt's authorization has been approved. Can take pt as early as Saturday. Provider notified.    TOC will continue to follow.   CSW was informed by [facility] that pt's authorization request has been started, status pending with insurance for [facility] on 10/17/23.   Pt can DC over the weekend if authorization gets approved and pt is medically stable.    TOC will continue to follow.   Expected Discharge Plan: IP Rehab Facility Barriers to Discharge: Continued Medical Work up  Expected Discharge Plan and Services                                               Social Determinants of Health (SDOH) Interventions SDOH Screenings   Food Insecurity: Patient Unable To Answer (09/25/2023)  Recent Concern: Food Insecurity - Food Insecurity Present (07/18/2023)   Received from [facility]  Housing: Unknown (09/25/2023)  Transportation Needs: Patient Unable To Answer (09/25/2023)  Recent Concern: Transportation Needs - Unmet Transportation Needs (07/18/2023)   Received from [facility]  Utilities: Patient Unable To Answer (09/25/2023)  Financial Resource Strain: Medium Risk (07/18/2023)   Received from [facility]  Physical Activity: Inactive (07/18/2023)   Received from [facility]  Social Connections: Patient Unable To Answer (09/25/2023)  Tobacco Use: Medium Risk (09/24/2023)  Health Literacy: High Risk (07/18/2023)   Received from [facility]    Readmission Risk Interventions     No data to display

## 2023-10-18 NOTE — Progress Notes (Signed)
 PROGRESS NOTE    BERDENE ASKARI  ZOX:[id number] DOB: Aug 26, 1955 DOA: 09/24/2023 PCP: Default, Provider, MD   Brief Narrative:  This 68 year old  Female with uncontrolled DM2, CKD stage IV, HTN, diastolic CHF, and cocaine abuse who was admitted to the hospital on 4/29 after she was found down on the floor unresponsive and seizing and ultimately diagnosed with DKA with seizure activity.  She required endotracheal intubation upon arrival to the ER, and was admitted to the ICU.  CT head was negative for acute bleeding.  MRI brain was negative for acute process. After correction of her DKA she was able to be extubated but suffered with stridor post-extubation.  ENT was consulted and the patient was found to have a left vocal cord polyp.  She was taken to the OR by ENT with excision of the polyp and required approximately 12 hours of rest on the ventilator postoperatively before she could be re-extubated. TRH Pick up 5/14  Assessment & Plan:   Principal Problem:   Seizure (HCC) Active Problems:   Acute encephalopathy   Acute hypercapnic respiratory failure (HCC)   Protein-calorie malnutrition, severe   Stridor   Vocal cord paralysis   Vocal cord polyp   DKA with coma , uncontrolled type 2 diabetes: Hb A1c 14 - DKA resolved - CBG now well-controlled with adjustments made in insulin  regimen during this admission.  Glucoses are still uncontrolled.  Continue lantus  to 20 units daily.  Diabetic coordinator consulted.   Acute hypoxic and hypercapnic respiratory failure: Due to seizure activity and DKA - resolved. Now patient stable on room air.   Aspiration pneumonia versus pneumonitis: Has completed a 7-day course of empiric antibiotic therapy . No persisting symptoms to suggest active infection.   Post extubation stridor with right vocal fold paralysis and large left hemorrhagic polyp of the vocal fold. Status post polypectomy by ENT 5/7  ENT to consider repeat flexible scope exam to  re-evaluate her right vocal cord - if her vocal cord remains inoperable,  She will need a contrasted CT of the neck and chest to evaluate her recurrent laryngeal nerve - presently it is not felt wise to administer IV contrast while renal function is recovering.  will need follow up timing of this with ENT (secure chat sent by Dr. Ada Acres -> plan is for this to be outpt when she recovers)?   Metabolic encephalopathy likely multifactorial: It could be due to DKA and toxic encephalopathy due to cocaine Mental status slow to improve - waxing/waning TSH elevated but free T4 normal (follow outpatient) B12 is normal , Folic acid  is low at 5.5  MRI with punctate focus of acute/early/subacute infarct . Neurology did not think it needed further chasing "as it is either an incidental stroke finding due to small vessel disease or it could be secondary to seizure". Repeat EEG without seizures or epileptiform discharges.  This may all be delirium. Delirium precautions.  Will monitor, workup additionally prn.   Neurology thinks patient is back to her baseline mental status.   Seizure with postictal state Incited by DKA - continue Keppra . Reduce Keppra  to 250 mg twice daily as patient remains seizure-free.   Acute kidney injury on CKD stage IV Required HD during initial portion of hospital stay. Nephrology has assisted in her care- baseline creatinine approximately 2.5-2.8  HD cath removed 5/10  Creatinine up to 3.09 today (BUN 75), will trend - not far off from estimated baseline.   Consider renal consult if significant worsening.  Plan  for follow up in [place] in 3-4 weeks Renal US  without hydronephrosis.   Normocytic anemia Folic acid  deficiency Hb has downtrended gradually during her hospitalization. Hb 12.7 at admission (not sure how accurate this was, ? Hemoconcentration in setting of DKA) -> has primarily been 8-10 recently.   Now Hb in 7's. No si/sx bleeding, will monitor.  Follow iron (low  normal), b12 (wnl), ferritin (wnl) Folate supplementation   Paroxysmal atrial fibrillation NSR at present.  Heart rate controlled.   Uncontrolled HTN: Continue Bidil , bystolic , torsemide  Adjust regimen prn, fluctuating   HLD Continue Lipitor    Chronic diastolic CHF Appears euvolemic    Cocaine abuse Encourage cessation.   Acute Superficial Vein Thrombosis of L Basilic Vein  Noted 4/30      DVT prophylaxis: Heparin  Code Status: Full code Family Communication: No family at bedside. Disposition Plan: SNF when medically ready  Consultants:  Neurology  Procedures:  Antimicrobials: Anti-infectives (From admission, onward)    Start     Dose/Rate Route Frequency Ordered Stop   09/25/23 1800  cefTRIAXone  (ROCEPHIN ) 2 g in sodium chloride  0.9 % 100 mL IVPB        2 g 200 mL/hr over 30 Minutes Intravenous Every 24 hours 09/25/23 0015 09/29/23 1833   09/24/23 1915  cefTRIAXone  (ROCEPHIN ) 1 g in sodium chloride  0.9 % 100 mL IVPB        1 g 200 mL/hr over 30 Minutes Intravenous  Once 09/24/23 1902 09/24/23 2010       Subjective: Patient was seen and examined at bedside.Overnight events noted. Patient reports feeling better,  denies any shortness of breath,  still reports having throat irritation.   She asks when she can be discharged.   Objective: Vitals:   10/17/23 2111 10/18/23 0400 10/18/23 0500 10/18/23 0903  BP: (!) 120/53 128/79  (!) 148/61  Pulse: 64 65  69  Resp: 18 18    Temp: 97.6 F (36.4 C) 98 F (36.7 C)  (!) 97.5 F (36.4 C)  TempSrc: Oral     SpO2: 98% 99%  98%  Weight:   66.4 kg   Height:        Intake/Output Summary (Last 24 hours) at 10/18/2023 1142 Last data filed at 10/17/2023 2027 Gross per 24 hour  Intake 50 ml  Output --  Net 50 ml   Filed Weights   10/16/23 0549 10/17/23 0500 10/18/23 0500  Weight: 62.7 kg 68 kg 66.4 kg    Examination:  General exam: Appears comfortable, severely deconditioned, not in any acute  distress. Respiratory system: CTA Bilaterally . Respiratory effort normal.  RR 14 Cardiovascular system: S1 & S2 heard, RRR. No JVD, murmurs, rubs, gallops or clicks. Gastrointestinal system: Abdomen is non distended, soft and non tender. Normal bowel sounds heard. Central nervous system: Alert and oriented x 2. No focal neurological deficits.  Stuttering speech at baseline. Extremities: No edema, no cyanosis, no clubbing Skin: No rashes, lesions or ulcers Psychiatry: . Mood & affect appropriate.   Data Reviewed: I have personally reviewed following labs and imaging studies  CBC: Recent Labs  Lab 10/12/23 0651 10/13/23 0949 10/14/23 0517 10/14/23 0822 10/14/23 1626 10/15/23 0848 10/18/23 0737  WBC 6.0 5.1 5.2 5.5  --  4.9 4.9  NEUTROABS 2.9 2.8 2.9  --   --  2.5  --   HGB 7.2* 7.0* 6.5* 7.6* 7.2* 7.9* 7.2*  HCT 22.3* 22.8* 20.8* 24.3* 23.7* 25.5* 23.6*  MCV 98.2 98.7 99.0 99.2  --  99.2  99.6  PLT 225 228 211 240  --  263 227   Basic Metabolic Panel: Recent Labs  Lab 10/12/23 0651 10/13/23 0949 10/14/23 0517 10/15/23 0848 10/18/23 0737  NA 145 141 141 145 142  K 4.8 4.8 5.0 4.7 4.6  CL 108 109 110 109 109  CO2 23 23 25 26 24   GLUCOSE 168* 187* 174* 132* 87  BUN 63* 65* 73* 75* 85*  CREATININE 2.64* 2.93* 3.07* 3.09* 3.29*  CALCIUM  8.3* 8.1* 8.2* 8.8* 8.6*  MG  --   --  1.9 2.1 2.2  PHOS  --   --  5.0* 5.4* 6.4*   GFR: Estimated Creatinine Clearance: 14.7 mL/min (A) (by C-G formula based on SCr of 3.29 mg/dL (H)). Liver Function Tests: Recent Labs  Lab 10/14/23 0517 10/15/23 0848  AST 103* 67*  ALT 71* 73*  ALKPHOS 264* 323*  BILITOT 0.5 0.6  PROT 5.6* 6.8  ALBUMIN 2.1* 2.6*   No results for input(s): "LIPASE", "AMYLASE" in the last 168 hours. No results for input(s): "AMMONIA" in the last 168 hours. Coagulation Profile: No results for input(s): "INR", "PROTIME" in the last 168 hours. Cardiac Enzymes: No results for input(s): "CKTOTAL", "CKMB",  "CKMBINDEX", "TROPONINI" in the last 168 hours. BNP (last 3 results) No results for input(s): "PROBNP" in the last 8760 hours. HbA1C: No results for input(s): "HGBA1C" in the last 72 hours. CBG: Recent Labs  Lab 10/17/23 0814 10/17/23 1223 10/17/23 1649 10/17/23 2105 10/18/23 0859  GLUCAP 80 188* 256* 206* 113*   Lipid Profile: No results for input(s): "CHOL", "HDL", "LDLCALC", "TRIG", "CHOLHDL", "LDLDIRECT" in the last 72 hours. Thyroid Function Tests: No results for input(s): "TSH", "T4TOTAL", "FREET4", "T3FREE", "THYROIDAB" in the last 72 hours. Anemia Panel: No results for input(s): "VITAMINB12", "FOLATE", "FERRITIN", "TIBC", "IRON", "RETICCTPCT" in the last 72 hours.  Sepsis Labs: No results for input(s): "PROCALCITON", "LATICACIDVEN" in the last 168 hours.  No results found for this or any previous visit (from the past 240 hours).   Radiology Studies: No results found.  Scheduled Meds:  atorvastatin   80 mg Oral Daily   Chlorhexidine  Gluconate Cloth  6 each Topical Daily   docusate sodium   100 mg Oral BID   feeding supplement (GLUCERNA SHAKE)  237 mL Oral TID BM   folic acid   1 mg Oral Daily   heparin   5,000 Units Subcutaneous Q8H   insulin  aspart  0-20 Units Subcutaneous TID WC   insulin  aspart  0-5 Units Subcutaneous QHS   insulin  glargine-yfgn  20 Units Subcutaneous Daily   isosorbide -hydrALAZINE   1.5 tablet Oral TID   levETIRAcetam   250 mg Oral BID   multivitamin with minerals  1 tablet Oral Daily   nebivolol   7.5 mg Oral Daily   mouth rinse  15 mL Mouth Rinse 4 times per day   pantoprazole   40 mg Oral Daily   polyethylene glycol  17 g Oral Daily   QUEtiapine   12.5 mg Oral QHS   sodium chloride  flush  10-40 mL Intracatheter Q12H   thiamine   100 mg Oral Daily   torsemide   20 mg Oral Daily   Continuous Infusions:   LOS: 24 days    Time spent: 35 mins    Magdalene School, MD [facility]   If 7PM-7AM, please contact night-coverage

## 2023-10-18 NOTE — Plan of Care (Signed)
  Problem: Education: Goal: Knowledge of General Education information will improve Description: Including pain rating scale, medication(s)/side effects and non-pharmacologic comfort measures Outcome: Progressing   Problem: Health Behavior/Discharge Planning: Goal: Ability to manage health-related needs will improve Outcome: Progressing   Problem: Clinical Measurements: Goal: Ability to maintain clinical measurements within normal limits will improve Outcome: Progressing Goal: Will remain free from infection Outcome: Progressing Goal: Diagnostic test results will improve Outcome: Progressing Goal: Respiratory complications will improve Outcome: Progressing Goal: Cardiovascular complication will be avoided Outcome: Progressing   Problem: Activity: Goal: Risk for activity intolerance will decrease Outcome: Progressing   Problem: Nutrition: Goal: Adequate nutrition will be maintained Outcome: Progressing   Problem: Coping: Goal: Level of anxiety will decrease Outcome: Progressing   Problem: Elimination: Goal: Will not experience complications related to bowel motility Outcome: Progressing Goal: Will not experience complications related to urinary retention Outcome: Progressing   Problem: Pain Managment: Goal: General experience of comfort will improve and/or be controlled Outcome: Progressing   Problem: Safety: Goal: Ability to remain free from injury will improve Outcome: Progressing   Problem: Skin Integrity: Goal: Risk for impaired skin integrity will decrease Outcome: Progressing   Problem: Education: Goal: Ability to describe self-care measures that may prevent or decrease complications (Diabetes Survival Skills Education) will improve Outcome: Progressing Goal: Individualized Educational Video(s) Outcome: Progressing   Problem: Coping: Goal: Ability to adjust to condition or change in health will improve Outcome: Progressing   Problem: Fluid  Volume: Goal: Ability to maintain a balanced intake and output will improve Outcome: Progressing   Problem: Health Behavior/Discharge Planning: Goal: Ability to identify and utilize available resources and services will improve Outcome: Progressing Goal: Ability to manage health-related needs will improve Outcome: Progressing   Problem: Metabolic: Goal: Ability to maintain appropriate glucose levels will improve Outcome: Progressing   Problem: Nutritional: Goal: Maintenance of adequate nutrition will improve Outcome: Progressing Goal: Progress toward achieving an optimal weight will improve Outcome: Progressing   Problem: Skin Integrity: Goal: Risk for impaired skin integrity will decrease Outcome: Progressing   Problem: Tissue Perfusion: Goal: Adequacy of tissue perfusion will improve Outcome: Progressing   Problem: Activity: Goal: Ability to tolerate increased activity will improve Outcome: Progressing   Problem: Respiratory: Goal: Ability to maintain a clear airway and adequate ventilation will improve Outcome: Progressing   Problem: Role Relationship: Goal: Method of communication will improve Outcome: Progressing   Problem: Education: Goal: Ability to describe self-care measures that may prevent or decrease complications (Diabetes Survival Skills Education) will improve Outcome: Progressing Goal: Individualized Educational Video(s) Outcome: Progressing   Problem: Cardiac: Goal: Ability to maintain an adequate cardiac output will improve Outcome: Progressing   Problem: Health Behavior/Discharge Planning: Goal: Ability to identify and utilize available resources and services will improve Outcome: Progressing Goal: Ability to manage health-related needs will improve Outcome: Progressing   Problem: Fluid Volume: Goal: Ability to achieve a balanced intake and output will improve Outcome: Progressing   Problem: Metabolic: Goal: Ability to maintain  appropriate glucose levels will improve Outcome: Progressing   Problem: Nutritional: Goal: Maintenance of adequate nutrition will improve Outcome: Progressing Goal: Maintenance of adequate weight for body size and type will improve Outcome: Progressing   Problem: Respiratory: Goal: Will regain and/or maintain adequate ventilation Outcome: Progressing   Problem: Urinary Elimination: Goal: Ability to achieve and maintain adequate renal perfusion and functioning will improve Outcome: Progressing

## 2023-10-18 NOTE — Progress Notes (Signed)
 Nutrition Follow-up  DOCUMENTATION CODES:   Severe malnutrition in context of chronic illness  INTERVENTION:   -Continue dysphagia 3 diet with honey thick liquids -Continue double protein portions with meals -Continue Glucerna Shake po TID, each supplement provides 220 kcal and 10 grams of protein  -Continue Magic cup TID with meals, each supplement provides 290 kcal and 9 grams of protein  -Continue MVI with minerals daily -Continue 1 mg folic acid  daily -Continue 100 mg thiamine  daily -500 mg vitamin C BID -220 mg zinc sulfate daily  NUTRITION DIAGNOSIS:   Severe Malnutrition related to chronic illness (uncontrolled DM, substance abuse) as evidenced by severe fat depletion, severe muscle depletion.  Ongoing  GOAL:   Patient will meet greater than or equal to 90% of their needs  Progressing   MONITOR:   PO intake, Supplement acceptance, Diet advancement  REASON FOR ASSESSMENT:   Consult Enteral/tube feeding initiation and management  ASSESSMENT:   Pt with PMH of CKD stage IV, uncontrolled IDDM, HTN, HLD, smoking, cocaine abuse (reports stopped March 2024), CHF admitted from home for seizures. UDS positive for cocaine and benzos.  4/29 - admit from home with seizures, intubated  5/1 - nutrition initiated, HD initiated 5/4 - extubated, BSE NPO 5/5 - cortrak placed, BSE NPO, transferred out of ICU 5/7 -  s/p excision of L vocal cord polyp/vocal fold stripping; pt self removed Cortrak  5/8 - MBS - diet advanced to dysphagia 3, nectar thick liquids 5/15: MBS - Dysphagia 3, liquids changed to honey thick  5/19- s/p BSE- dysphagia 3 diet with honey thick liquids 5/22- s/p BSE- dysphagia 3 diet with nectar thick liquids  Reviewed I/O's: +50 ml x 24 hours and +3.2 L since 10/04/23  Pt unavailable at time of visit. Attempted to speak with pt via call to hospital room phone, however, unable to reach.  Case discussed with RN, who reports pt has a good appetite and  tolerating diet consistency well. Meal completions 40-100% of dysphagia 3 diet on honey thick liquids.   Wt has been stable over the past week.   Medications reviewed and include colace, folic acid , keppra , protonix , miralax , thiamine , and demadex .   Per TOC notes, plan for SNF placement once medically stable. Noted possibility to d/c over the weekend.  Labs reviewed: CBGS: [id number] (inpatient orders for glycemic control are 0-20 units insulin  aspart TID with meals, 0-5 units inuslin aspart daily at bedtime, and 20 units insulin  glargine-yfgn daily).    Diet Order:   Diet Order             DIET DYS 3 Fluid consistency: Honey Thick  Diet effective now                   EDUCATION NEEDS:   Not appropriate for education at this time  Skin:  Skin Assessment: Skin Integrity Issues: Skin Integrity Issues:: Stage II Stage II: medial sacrum  Last BM:  10/18/23 (type 6)  Height:   Ht Readings from Last 1 Encounters:  10/02/23 5\' 5"  (1.651 m)    Weight:   Wt Readings from Last 1 Encounters:  10/18/23 66.4 kg    Ideal Body Weight:  56.8 kg  BMI:  Body mass index is 24.36 kg/m.  Estimated Nutritional Needs:   Kcal:  1600-1800  Protein:  90-100 grams  Fluid:  1.2 L/day    Herschel Lords, RD, LDN, CDCES Registered Dietitian III Certified Diabetes Care and Education Specialist If unable to reach this RD, please  use "RD Inpatient" group chat on secure chat between hours of 8am-4 pm daily

## 2023-10-18 NOTE — Progress Notes (Addendum)
 Incentive spirometry introduced with tech back, patient is not much aware about learning

## 2023-10-19 DIAGNOSIS — E43 Unspecified severe protein-calorie malnutrition: Secondary | ICD-10-CM | POA: Diagnosis not present

## 2023-10-19 DIAGNOSIS — G40309 Generalized idiopathic epilepsy and epileptic syndromes, not intractable, without status epilepticus: Secondary | ICD-10-CM | POA: Diagnosis not present

## 2023-10-19 DIAGNOSIS — R531 Weakness: Secondary | ICD-10-CM | POA: Diagnosis not present

## 2023-10-19 DIAGNOSIS — G9341 Metabolic encephalopathy: Secondary | ICD-10-CM | POA: Diagnosis not present

## 2023-10-19 DIAGNOSIS — M6281 Muscle weakness (generalized): Secondary | ICD-10-CM | POA: Diagnosis not present

## 2023-10-19 DIAGNOSIS — L0231 Cutaneous abscess of buttock: Secondary | ICD-10-CM | POA: Diagnosis not present

## 2023-10-19 DIAGNOSIS — J9602 Acute respiratory failure with hypercapnia: Secondary | ICD-10-CM | POA: Diagnosis not present

## 2023-10-19 DIAGNOSIS — L8990 Pressure ulcer of unspecified site, unspecified stage: Secondary | ICD-10-CM | POA: Diagnosis not present

## 2023-10-19 DIAGNOSIS — Z743 Need for continuous supervision: Secondary | ICD-10-CM | POA: Diagnosis not present

## 2023-10-19 DIAGNOSIS — I469 Cardiac arrest, cause unspecified: Secondary | ICD-10-CM | POA: Diagnosis not present

## 2023-10-19 DIAGNOSIS — R1312 Dysphagia, oropharyngeal phase: Secondary | ICD-10-CM | POA: Diagnosis not present

## 2023-10-19 DIAGNOSIS — J381 Polyp of vocal cord and larynx: Secondary | ICD-10-CM | POA: Diagnosis not present

## 2023-10-19 DIAGNOSIS — R55 Syncope and collapse: Secondary | ICD-10-CM | POA: Diagnosis not present

## 2023-10-19 DIAGNOSIS — E1110 Type 2 diabetes mellitus with ketoacidosis without coma: Secondary | ICD-10-CM | POA: Diagnosis not present

## 2023-10-19 DIAGNOSIS — R569 Unspecified convulsions: Secondary | ICD-10-CM | POA: Diagnosis not present

## 2023-10-19 DIAGNOSIS — G4089 Other seizures: Secondary | ICD-10-CM | POA: Diagnosis not present

## 2023-10-19 DIAGNOSIS — R110 Nausea: Secondary | ICD-10-CM | POA: Diagnosis not present

## 2023-10-19 DIAGNOSIS — J3800 Paralysis of vocal cords and larynx, unspecified: Secondary | ICD-10-CM | POA: Diagnosis not present

## 2023-10-19 DIAGNOSIS — Z7401 Bed confinement status: Secondary | ICD-10-CM | POA: Diagnosis not present

## 2023-10-19 DIAGNOSIS — R41841 Cognitive communication deficit: Secondary | ICD-10-CM | POA: Diagnosis not present

## 2023-10-19 DIAGNOSIS — F03911 Unspecified dementia, unspecified severity, with agitation: Secondary | ICD-10-CM | POA: Diagnosis not present

## 2023-10-19 LAB — MAGNESIUM: Magnesium: 2.2 mg/dL (ref 1.7–2.4)

## 2023-10-19 LAB — BASIC METABOLIC PANEL WITH GFR
Anion gap: 11 (ref 5–15)
BUN: 86 mg/dL — ABNORMAL HIGH (ref 8–23)
CO2: 21 mmol/L — ABNORMAL LOW (ref 22–32)
Calcium: 8.6 mg/dL — ABNORMAL LOW (ref 8.9–10.3)
Chloride: 109 mmol/L (ref 98–111)
Creatinine, Ser: 3.44 mg/dL — ABNORMAL HIGH (ref 0.44–1.00)
GFR, Estimated: 14 mL/min — ABNORMAL LOW (ref >60)
Glucose, Bld: 192 mg/dL — ABNORMAL HIGH (ref 70–99)
Potassium: 5.3 mmol/L — ABNORMAL HIGH (ref 3.5–5.1)
Sodium: 141 mmol/L (ref 135–145)

## 2023-10-19 LAB — GLUCOSE, CAPILLARY
Glucose-Capillary: 177 mg/dL — ABNORMAL HIGH (ref 70–99)
Glucose-Capillary: 232 mg/dL — ABNORMAL HIGH (ref 70–99)
Glucose-Capillary: 274 mg/dL — ABNORMAL HIGH (ref 70–99)

## 2023-10-19 LAB — PHOSPHORUS: Phosphorus: 6.3 mg/dL — ABNORMAL HIGH (ref 2.5–4.6)

## 2023-10-19 MED ORDER — ASCORBIC ACID 500 MG PO TABS: 500.0000 mg | ORAL_TABLET | Freq: Two times a day (BID) | ORAL | 0 refills | Status: AC

## 2023-10-19 MED ORDER — QUETIAPINE FUMARATE 25 MG PO TABS
12.5000 mg | ORAL_TABLET | Freq: Every day | ORAL | 0 refills | Status: AC
Start: 2023-10-19 — End: 2023-11-18

## 2023-10-19 MED ORDER — ISOSORB DINITRATE-HYDRALAZINE 20-37.5 MG PO TABS: 1.5000 | ORAL_TABLET | Freq: Three times a day (TID) | ORAL | 0 refills | Status: AC

## 2023-10-19 MED ORDER — FOLIC ACID 1 MG PO TABS: 1.0000 mg | ORAL_TABLET | Freq: Every day | ORAL | 0 refills | Status: AC

## 2023-10-19 MED ORDER — LEVETIRACETAM 250 MG PO TABS: 250.0000 mg | ORAL_TABLET | Freq: Two times a day (BID) | ORAL | 0 refills | Status: AC

## 2023-10-19 MED ORDER — NEBIVOLOL HCL 2.5 MG PO TABS: 7.5000 mg | ORAL_TABLET | Freq: Every day | ORAL | 0 refills | Status: AC

## 2023-10-19 NOTE — TOC Transition Note (Signed)
 Transition of Care [facility]) - Discharge Note   Patient Details  Name: Lori Gates MRN: [id number] Date of Birth: 1955-06-23  [facility] CM/SW Contact:  Jeffory Mings, LCSW Phone Number: 10/19/2023, 11:41 AM   Clinical Narrative:  Pt for dc to [facility] today. Spoke to [place] in admissions who confirmed they are prepared to admit pt to room 3218. Pt's grandson [name] aware of dc and reports agreeable. RN provided with number for report and [facility] arranged for transport. SW signing off at dc.   Paullette Boston, MSW, LCSW [phone] (coverage)      Final next level of care: Skilled Nursing Facility Barriers to Discharge: Barriers Resolved   Patient Goals and CMS Choice Patient states their goals for this hospitalization and ongoing recovery are:: currently intubated          Discharge Placement              Patient chooses bed at: [facility] Patient to be transferred to facility by: [facility] Name of family member notified: [name]/grandson Patient and family notified of of transfer: 10/19/23  Discharge Plan and Services Additional resources added to the After Visit Summary for                                       Social Drivers of Health (SDOH) Interventions SDOH Screenings   Food Insecurity: Patient Unable To Answer (09/25/2023)  Recent Concern: Food Insecurity - Food Insecurity Present (07/18/2023)   Received from [facility]  Housing: Unknown (09/25/2023)  Transportation Needs: Patient Unable To Answer (09/25/2023)  Recent Concern: Transportation Needs - Unmet Transportation Needs (07/18/2023)   Received from [facility]  Utilities: Patient Unable To Answer (09/25/2023)  Financial Resource Strain: Medium Risk (07/18/2023)   Received from [facility]  Physical Activity: Inactive (07/18/2023)   Received from [facility]  Social Connections: Patient Unable To Answer (09/25/2023)  Tobacco Use: Medium Risk  (09/24/2023)  Health Literacy: High Risk (07/18/2023)   Received from [facility]     Readmission Risk Interventions     No data to display

## 2023-10-19 NOTE — Discharge Summary (Signed)
 Physician Discharge Summary  Lori Gates VWU:[id number] DOB: [birth year] DOA: 09/24/2023  PCP: Default, Provider, MD  Admit date: 09/24/2023  Discharge date: 10/19/2023  Admitted From: Home.  Disposition:  [facility]NF[facility])  Recommendations for Outpatient Follow-up:  Follow up with PCP in 1-2 weeks. Please obtain BMP/CBC in one week. Advised to take torsemide  20 mg daily. Advised to follow-up with Nephrology as scheduled.  Home Health: None Equipment/Devices:None  Discharge Condition: Good CODE STATUS:Full code Diet recommendation: Renal Diet  Brief Summary/ Hospital Course: This 68 year old  Female with uncontrolled DM2, CKD stage IV, HTN, diastolic CHF, and cocaine abuse who was admitted to the hospital on 4/29 after she was found down on the floor unresponsive and seizing and ultimately diagnosed with DKA with seizure activity.  She required endotracheal intubation upon arrival to the ER, and was admitted to the ICU.  CT head was negative for acute bleeding.  MRI brain was negative for acute process. After correction of her DKA she was able to be extubated but suffered with stridor post-extubation.  ENT was consulted and the patient was found to have a left vocal cord polyp.  She was taken to the OR by ENT with excision of the polyp and required approximately 12 hours of rest on the ventilator postoperatively before she could be re-extubated. TRH Pick up 5/14.  Patient has made significant improvement throughout the hospital course. She has completed antibiotics course for aspiration pneumonia.  She is back to her baseline mental status. Patient was evaluated by neurologist,  recommended to reduce Keppra  dose to 250 mg twice daily as patient continued to remain seizure-free.  PT and OT recommended skilled nursing facility,  Insurance authorization approved.  Patient being discharged to [facility].  Discharge Diagnoses:  Principal Problem:   Seizure (HCC) Active Problems:   Acute  encephalopathy   Acute hypercapnic respiratory failure (HCC)   Protein-calorie malnutrition, severe   Stridor   Vocal cord paralysis   Vocal cord polyp  DKA with coma , uncontrolled type 2 diabetes: Hb A1c 14 - DKA resolved - CBG now well-controlled with adjustments made in insulin  regimen during this admission.  Glucoses are still uncontrolled.  Continue lantus  to 20 units daily.  Diabetic coordinator consulted.   Acute hypoxic and hypercapnic respiratory failure: Due to seizure activity and DKA - resolved. Now patient stable on room air.   Aspiration pneumonia versus pneumonitis: Has completed a 7-day course of empiric antibiotic therapy . No persisting symptoms to suggest active infection.   Post extubation stridor with right vocal fold paralysis and large left hemorrhagic polyp of the vocal fold. Status post polypectomy by ENT 5/7  ENT to consider repeat flexible scope exam to re-evaluate her right vocal cord - if her vocal cord remains inoperable,  She will need a contrasted CT of the neck and chest to evaluate her recurrent laryngeal nerve - presently it is not felt wise to administer IV contrast while renal function is recovering.  will need follow up timing of this with ENT (secure chat sent by Lori Gates -> plan is for this to be outpt when she recovers)?   Metabolic encephalopathy likely multifactorial: It could be due to DKA and toxic encephalopathy due to cocaine Mental status slow to improve - waxing/waning TSH elevated but free T4 normal (follow outpatient) B12 is normal , Folic acid  is low at 5.5  MRI with punctate focus of acute/early/subacute infarct . Neurology did not think it needed further chasing "as it is either an incidental  stroke finding due to small vessel disease or it could be secondary to seizure". Repeat EEG without seizures or epileptiform discharges.  This may all be delirium. Delirium precautions.  Will monitor, workup additionally prn.   Neurology  thinks patient is back to her baseline mental status.   Seizure with postictal state Incited by DKA - continue Keppra . Reduce Keppra  to 250 mg twice daily as patient remains seizure-free.   Acute kidney injury on CKD stage IV Required HD during initial portion of hospital stay. Nephrology has assisted in her care- baseline creatinine approximately 2.5-2.8  HD cath removed 5/10  Creatinine up to 3.09 today (BUN 75), will trend - not far off from estimated baseline.   Plan for follow up in [place] in 3-4 weeks Renal US  without hydronephrosis. Repeat BMP in 3 to 4 days.   Normocytic anemia Folic acid  deficiency Hb has downtrended gradually during her hospitalization. Hb 12.7 at admission (not sure how accurate this was, ? Hemoconcentration in setting of DKA) -> has primarily been 8-10 recently.   Now Hb in 7's. No si/sx bleeding, will monitor.  Follow iron (low normal), b12 (wnl), ferritin (wnl) Folate supplementation   Paroxysmal atrial fibrillation NSR at present.  Heart rate controlled.   Uncontrolled HTN: Continue Bidil , bystolic , torsemide . Adjust regimen prn, fluctuating   HLD Continue Lipitor    Chronic diastolic CHF Appears euvolemic    Cocaine abuse Encourage cessation.   Acute Superficial Vein Thrombosis of L Basilic Vein  Noted 4/30   Discharge Instructions  Discharge Instructions     Call MD for:  difficulty breathing, headache or visual disturbances   Complete by: As directed    Call MD for:  persistant dizziness or light-headedness   Complete by: As directed    Call MD for:  persistant nausea and vomiting   Complete by: As directed    Diet - low sodium heart healthy   Complete by: As directed    Diet general   Complete by: As directed    Discharge instructions   Complete by: As directed    Advised to follow-up with primary care physician in 1 week. Advised to take torsemide  20 mg daily. Advised to follow-up with nephrology as scheduled.    Increase activity slowly   Complete by: As directed    No wound care   Complete by: As directed       Allergies as of 10/19/2023   No Known Allergies      Medication List     STOP taking these medications    ibuprofen 200 MG tablet Commonly known as: ADVIL       TAKE these medications    albuterol -ipratropium 18-103 MCG/ACT inhaler Commonly known as: COMBIVENT Inhale 2 puffs into the lungs every 6 (six) hours as needed. For shortness of breath   amLODipine  5 MG tablet Commonly known as: NORVASC  Take 5 mg by mouth daily.   ascorbic acid 500 MG tablet Commonly known as: VITAMIN C Take 1 tablet (500 mg total) by mouth 2 (two) times daily.   atorvastatin  80 MG tablet Commonly known as: LIPITOR  Take 80 mg by mouth daily.   folic acid  1 MG tablet Commonly known as: FOLVITE  Take 1 tablet (1 mg total) by mouth daily. Start taking on: Oct 20, 2023   insulin  lispro 100 UNIT/ML KwikPen Commonly known as: HUMALOG Inject 13 Units into the skin 3 (three) times daily.   isosorbide -hydrALAZINE  20-37.5 MG tablet Commonly known as: BIDIL  Take 1.5 tablets by mouth  3 (three) times daily.   Lantus  SoloStar 100 UNIT/ML Solostar Pen Generic drug: insulin  glargine Inject 20 Units into the skin at bedtime.   levETIRAcetam  250 MG tablet Commonly known as: KEPPRA  Take 1 tablet (250 mg total) by mouth 2 (two) times daily.   nebivolol  2.5 MG tablet Commonly known as: BYSTOLIC  Take 3 tablets (7.5 mg total) by mouth daily. Start taking on: Oct 20, 2023 What changed: how much to take   ondansetron  4 MG tablet Commonly known as: Zofran  Take 1 tablet (4 mg total) by mouth every 8 (eight) hours as needed for nausea.   QUEtiapine  25 MG tablet Commonly known as: SEROQUEL  Take 0.5 tablets (12.5 mg total) by mouth at bedtime.   Rybelsus 3 MG Tabs Generic drug: Semaglutide Take 2 tablets by mouth daily.   thiamine  100 MG tablet Commonly known as: Vitamin B-1 Take 100 mg by  mouth daily.   torsemide  20 MG tablet Commonly known as: DEMADEX  Take 20 mg by mouth daily.        Contact information for follow-up providers     Chucky Craver, MD Follow up in 1 week(s).   Specialty: Nephrology Contact information: [address] [place] [address] [phone]              Contact information for after-discharge care     Destination     [facility] Preferred SNF .   Service: Skilled Nursing Contact information: [address] [place]  [address] [phone]                    No Known Allergies  Consultations: Nephrology Cardiology   Procedures/Studies: EEG adult Result Date: 10/13/2023 Arleene Lack, MD     10/13/2023  6:28 PM Patient Name: Lori Gates MRN: [id number] Epilepsy Attending: Arleene Lack Referring Physician/Provider: Etter Hermann., MD Date: 10/13/2023 Duration: 22.29 mins Patient history: 68yo F with ams. EEG to evaluate for seizure Level of alertness: Awake AEDs during EEG study: LEV Technical aspects: This EEG study was done with scalp electrodes positioned according to the 10-20 International system of electrode placement. Electrical activity was reviewed with band pass filter of 1-70Hz , sensitivity of 7 uV/mm, display speed of 27mm/sec with a 60Hz  notched filter applied as appropriate. EEG data were recorded continuously and digitally stored.  Video monitoring was available and reviewed as appropriate. Description: The posterior dominant rhythm consists of 8 Hz activity of moderate voltage (25-35 uV) seen predominantly in posterior head regions, symmetric and reactive to eye opening and eye closing. Hyperventilation and photic stimulation were not performed.   IMPRESSION: This study is within normal limits. No seizures or epileptiform discharges were seen throughout the recording. A normal interictal EEG does not exclude the diagnosis of epilepsy. Arleene Lack   US  RENAL Result Date: 10/13/2023 CLINICAL DATA:  [id number] AKI (acute kidney injury) (HCC) 244010 EXAM: RENAL / URINARY TRACT ULTRASOUND COMPLETE COMPARISON:  None Available. FINDINGS: Right Kidney: Renal measurements: 9.4 x 4.6 x 6.1 cm = volume: 138 mL. Echogenicity is mildly increased. No definitive mass or hydronephrosis visualized. Portions are suboptimally assessed secondary to shadowing bowel gas. Left Kidney: Renal measurements: 9.8 x 4.4 x 4.4 cm = volume: 100 mL. Echogenicity is mildly increased. No definitive mass or hydronephrosis visualized. Portions are suboptimally assessed secondary to shadowing bowel gas. Bladder: Appears normal for degree of bladder distention. Other: Small RIGHT pleural effusion. IMPRESSION: 1. No hydronephrosis. 2. Mildly increased renal echogenicity as can  be seen in medical renal disease. 3. Small RIGHT pleural effusion. Electronically Signed   By: Clancy Crimes M.D.   On: 10/13/2023 16:58   DG Swallowing Func-Speech Pathology Result Date: 10/10/2023 Table formatting from the original result was not included. Modified Barium Swallow Study Patient Details Name: Lori Gates MRN: [id number] Date of Birth: [birth year] Today's Date: 10/10/2023 HPI/PMH: HPI: Joyice Magda is a 68 year old female who presented to ED after daughter found on floor actively seizing, CBG read "high". Continued to seize from time of EMS dispatch to arrival to ER, and was intubated for airway protection.  ETT 4/29-5/4.  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 Pt with poorly controlled IDDM, CKD stage IV, HTN, LLD, HFpEF, substance  abuse (cocaine) followed by nephrology in [place] [facility]. Clinical Impression: Pt presents with a moderate oropharyngeal dysphagia c/b delayed swallow intiation, decreased base of tongue retraction, reduced laryngeal elevation, incomplete laryngeal closure, reduced UES opening, and diminished sensation. Pt also has known R VF immobility from ENT evaluation, although phonation is slightly improving.  Silent aspiration was observed with think and nectar/mildly thick liquids.  There was transient penetration of honey/moderately thick liquids.  There was no penetation or aspiration with purees or solid textures.  Today's evaluation marks as slight decline from MBSS on 5/8.  Pt's swallow function was very inconsistent across trials today. This may be related to cognition.  Pt was observed to hold boluses in oral cavity with piecemeal deglutition observed with liquids and solids.  When pt was given cup to drink independently, aspiration decreased in frequency but still occurred.  All aspiration was silent.  Some instances of aspiration were transient.  Pt was able to clear penetration aspiration with a cued cough in one instance, but could not reliably follow instructions for cough. Amount of pharyngeal residuals was inconsistent as well, and suspect cognition may be playing a roll here as well.  Pt intermittently required cues to swallow to clear residue with liquids.  Given waxing and waning performance, will changes liquid recommendation to honey thick at present.  Pill simulation was not evaluated today.  There was one instance of backflow observed to the pyriform sinuses without penetration/aspiraiton. Recommend mechanial soft solids with honey thick liquids. DIGEST Swallow Severity Rating*  Safety: 2  Efficiency: 1  Overall Pharyngeal Swallow Severity: 2 1: mild; 2: moderate; 3: severe; 4: profound *The Dynamic Imaging Grade of Swallowing Toxicity is standardized for the head and neck cancer population, however,  demonstrates promising clinical applications across populations to standardize the clinical rating of pharyngeal swallow safety and severity. Factors that may increase risk of adverse event in presence of aspiration Roderick Civatte & Jessy Morocco 2021): Factors that may increase risk of adverse event in presence of aspiration Roderick Civatte & Jessy Morocco 2021): Reduced cognitive function Recommendations/Plan: Swallowing Evaluation Recommendations Swallowing Evaluation Recommendations Recommendations: PO diet PO Diet Recommendation: Dysphagia 3 (Mechanical soft); Moderately thick liquids (Level 3, honey thick) Liquid Administration via: Cup Medication Administration: Crushed with puree Supervision: Intermittent supervision/cueing for swallowing strategies Swallowing strategies  : Slow rate; Small bites/sips Postural changes: Position pt fully upright for meals Oral care recommendations: Oral care BID (2x/day)  Caregiver Recommendations: Avoid jello, ice cream, thin soups, popsicles Treatment Plan Treatment Plan Treatment recommendations: Therapy as outlined in treatment plan below Follow-up recommendations: Skilled nursing-short term rehab (<3 hours/day) Functional status assessment: Patient has had a recent decline in their functional status and demonstrates the ability to make significant improvements in function in a reasonable and predictable amount of time. Treatment frequency: Min 2x/week Treatment duration: 2 weeks Interventions: Diet toleration management by SLP; Patient/family education; Aspiration precaution training; Trials of upgraded texture/liquids Recommendations Recommendations for follow up therapy are one component of a multi-disciplinary discharge planning process, led by the attending physician.  Recommendations may be updated based on patient status, additional functional criteria and insurance authorization. Assessment: Orofacial Exam: Orofacial Exam Oral Cavity: Oral Hygiene: WFL Oral Cavity - Dentition: Edentulous;  Dentures, not available Orofacial Anatomy: WFL Oral Motor/Sensory Function: -- (See BSE) Anatomy: Anatomy: Suspected cervical osteophytes (without obstruction of bolus flow) Boluses Administered: Boluses Administered Boluses Administered: Thin liquids (Level 0); Mildly thick liquids (Level 2, nectar thick); Moderately thick liquids (Level 3, honey thick); Puree; Solid  Oral Impairment Domain: Oral Impairment Domain Lip Closure: Escape from interlabial space or lateral juncture, no extension beyond vermillion border Tongue control during bolus hold: Cohesive bolus between tongue to palatal seal; Posterior escape of less than half of bolus Bolus preparation/mastication: Slow prolonged chewing/mashing with complete recollection Bolus transport/lingual motion: Brisk tongue motion Oral residue: Complete oral clearance Location of oral residue : N/A Initiation of pharyngeal swallow : Pyriform sinuses; Posterior angle of the ramus  Pharyngeal Impairment Domain: Pharyngeal Impairment Domain Soft palate elevation: No bolus between soft palate (SP)/pharyngeal wall (PW) Laryngeal elevation: Partial superior movement of thyroid cartilage/partial approximation of arytenoids to epiglottic petiole Anterior hyoid excursion: Complete anterior movement Epiglottic movement: Complete inversion Laryngeal vestibule closure: Incomplete, narrow column air/contrast in laryngeal vestibule Pharyngeal stripping wave : Present - diminished Pharyngeal contraction (A/P view only): N/A Pharyngoesophageal segment opening: Partial distention/partial duration, partial obstruction of flow Tongue base retraction: Trace column of contrast or air between tongue base and PPW Pharyngeal residue: Collection of residue within or on pharyngeal structures; Trace residue within or on pharyngeal structures Location of pharyngeal residue: Valleculae; Pyriform sinuses  Esophageal Impairment Domain: Esophageal Impairment Domain Esophageal clearance upright  position: Esophageal retention with retrograde flow through the PES (x1) Pill: Pill Consistency administered: -- (Not assessed) Penetration/Aspiration Scale Score: Penetration/Aspiration Scale Score 1.  Material does not enter airway: Puree; Solid 2.  Material enters airway, remains ABOVE vocal cords then ejected out: Moderately thick liquids (Level 3, honey thick) 8.  Material enters airway, passes BELOW cords without attempt by patient to eject out (silent aspiration) : Thin liquids (Level 0); Mildly thick liquids (Level 2, nectar thick) Compensatory Strategies: Compensatory Strategies Compensatory strategies: No   General Information: Caregiver present: No  Diet Prior to this Study: Dysphagia 3 (mechanical soft); Mildly thick liquids (Level 2, nectar thick)   No data recorded  Respiratory Status: WFL   Supplemental O2: None (Room air)   History of Recent Intubation: Yes  Behavior/Cognition: Alert; Cooperative Self-Feeding Abilities: Able to self-feed Baseline vocal quality/speech: Dysphonic No data recorded No data recorded Exam Limitations: -- (Unable to consistently follow directions) Goal Planning: Prognosis for improved oropharyngeal function: Guarded Barriers to Reach Goals: Cognitive deficits Barriers/Prognosis Comment: Physiological impariments 2/2 L VF paralysis Patient/Family Stated Goal: none stated Consulted and agree with results and recommendations: Nurse Pain: Pain Assessment Faces Pain Scale: 0 End of Session: Start Time:SLP Start Time (ACUTE ONLY): 0805 Stop Time:  SLP Stop Time (ACUTE ONLY): 0818 Time Calculation:SLP Time Calculation (min) (ACUTE ONLY): 13 min Charges: SLP Evaluations $ SLP Speech Visit: 1 Visit SLP Evaluations $MBS Swallow: 1 Procedure $Swallowing Treatment: 1 Procedure SLP visit diagnosis: SLP Visit Diagnosis: Dysphagia, oropharyngeal phase (R13.12) Past Medical History: Past Medical History: Diagnosis Date  Asthma   Diabetes mellitus without complication (HCC)   Hypertension   Past Surgical History: Past Surgical History: Procedure Laterality Date  MICROLARYNGOSCOPY WITH LASER Left 10/02/2023  Procedure: DIRECT MICROLARYNGOSCOPY, BRONCHOSCOPY, CO2 LASER EXCISION OF LEFT VOCAL FOLD POLYP/MASS;  Surgeon: Artice Last, MD;  Location: [facility] OR;  Service: ENT;  Laterality: Left;  RIGID BRONCHOSCOPY N/A 10/02/2023  Procedure: Francesco Inks;  Surgeon: Artice Last, MD;  Location: [facility] OR;  Service: ENT;  Laterality: N/A;  TUBAL LIGATION   Elester Grim, MA, CCC-SLP Acute Rehabilitation Services Office: [phone] 10/10/2023, 9:18 AM  DG Swallowing Func-Speech Pathology Result Date: 10/03/2023 Table formatting from the original result was not included. Modified Barium Swallow Study Patient Details Name: Lori Gates MRN: [id number] Date of Birth: 24-May-1956 Today's Date: 10/03/2023 HPI/PMH: HPI: Krina Mraz is a 68 year old female who presented to ED after daughter found on floor actively seizing, CBG read "high". Continued to seize from time of EMS dispatch to arrival to ER, and was intubated for airway protection.  ETT 4/29-5/4.  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 Pt with poorly controlled IDDM, CKD stage IV, HTN, LLD, HFpEF, substance abuse (cocaine) followed by nephrology in [place] [facility]. Clinical Impression: Pt presents with a mild oropharyngeal dysphagia c/b delayed swallow initiation, decreased base of tongue retraction, reduced laryngeal elevation, incomplete laryngeal closure and diminished sensation.  Pt has known immobile R VF per ENT asssessment.  These deficits resulted in silent aspiration of thin liquid at  posterior trachea during the swallow.  Pt was uanble to follow instructions for cued cough to clear aspiraiton, and suspect she would not reliably execute compensatory strategies at this time if any were effective.  Contrast progessed down posterior tracheal wall and then remained static in upper trachea for the majority of the duration of the study  There transient penetration of nectar thick liquid.  There was no penetration of puree, honey, or regular solid. Pt exhibited prolonged oral phase with solid 2/2 edentulism, but acheived adequate oral clearance.  During pill simulation pt was unable to transit tablet orally with puree.  Recommend crushing medications.   Pt appears safe to initiate a modified oral diet.  Recommend mechanical soft solids with nectar thick liquid. DIGEST Swallow Severity Rating*  Safety: 1  Efficiency: 1  Overall Pharyngeal Swallow Severity: 1 1: mild; 2: moderate; 3: severe; 4: profound *The Dynamic Imaging Grade of Swallowing Toxicity is standardized for the head and neck cancer population, however, demonstrates promising clinical applications across populations to standardize the clinical rating of pharyngeal swallow safety and severity. Factors that may increase risk of adverse event in presence of aspiration Roderick Civatte & Jessy Morocco 2021): Factors that may increase risk of adverse event in presence of aspiration Roderick Civatte & Jessy Morocco 2021): Reduced cognitive function Recommendations/Plan: Swallowing Evaluation Recommendations Swallowing Evaluation Recommendations Recommendations: PO diet PO  Diet Recommendation: Dysphagia 3 (Mechanical soft); Mildly thick liquids (Level 2, nectar thick) Liquid Administration via: Cup Medication Administration: Crushed with puree Supervision: Full assist for feeding Swallowing strategies  : Slow rate; Small bites/sips Postural changes: Position pt fully upright for meals Oral care recommendations: Oral care BID (2x/day) Caregiver Recommendations: Avoid jello, ice  cream, thin soups, popsicles Treatment Plan Treatment Plan Treatment recommendations: Therapy as outlined in treatment plan below Follow-up recommendations: -- (ST at next level of care) Functional status assessment: Patient has had a recent decline in their functional status and demonstrates the ability to make significant improvements in function in a reasonable and predictable amount of time. Treatment frequency: Min 2x/week Treatment duration: 2 weeks Interventions: Diet toleration management by SLP; Patient/family education (Exercises if pt becomes able to participate) Recommendations Recommendations for follow up therapy are one component of a multi-disciplinary discharge planning process, led by the attending physician.  Recommendations may be updated based on patient status, additional functional criteria and insurance authorization. Assessment: Orofacial Exam: Orofacial Exam Oral Cavity: Oral Hygiene: WFL Oral Cavity - Dentition: Edentulous; Dentures, not available Orofacial Anatomy: WFL Oral Motor/Sensory Function: -- (See BSE) Anatomy: Anatomy: Suspected cervical osteophytes (without impact on sw fn) Boluses Administered: Boluses Administered Boluses Administered: Thin liquids (Level 0); Mildly thick liquids (Level 2, nectar thick); Moderately thick liquids (Level 3, honey thick); Puree; Solid  Oral Impairment Domain: Oral Impairment Domain Lip Closure: Interlabial escape, no progression to anterior lip Tongue control during bolus hold: Cohesive bolus between tongue to palatal seal Bolus preparation/mastication: Slow prolonged chewing/mashing with complete recollection Bolus transport/lingual motion: Brisk tongue motion Oral residue: Trace residue lining oral structures Location of oral residue : Tongue Initiation of pharyngeal swallow : Pyriform sinuses  Pharyngeal Impairment Domain: Pharyngeal Impairment Domain Soft palate elevation: No bolus between soft palate (SP)/pharyngeal wall (PW) Laryngeal  elevation: Partial superior movement of thyroid cartilage/partial approximation of arytenoids to epiglottic petiole Anterior hyoid excursion: Complete anterior movement Epiglottic movement: Complete inversion Laryngeal vestibule closure: Incomplete, narrow column air/contrast in laryngeal vestibule Pharyngeal stripping wave : Present - diminished Pharyngeal contraction (A/P view only): N/A Pharyngoesophageal segment opening: Complete distension and complete duration, no obstruction of flow Tongue base retraction: Trace column of contrast or air between tongue base and PPW Pharyngeal residue: Trace residue within or on pharyngeal structures Location of pharyngeal residue: Valleculae  Esophageal Impairment Domain: Esophageal Impairment Domain Esophageal clearance upright position: -- (Not assessed) Pill: Pill Consistency administered: Puree Puree: Impaired (see clinical impressions) (Unable to transit orally) Penetration/Aspiration Scale Score: Penetration/Aspiration Scale Score 1.  Material does not enter airway: Moderately thick liquids (Level 3, honey thick); Puree; Solid 2.  Material enters airway, remains ABOVE vocal cords then ejected out: Mildly thick liquids (Level 2, nectar thick) 8.  Material enters airway, passes BELOW cords without attempt by patient to eject out (silent aspiration) : Thin liquids (Level 0) Compensatory Strategies: Compensatory Strategies Compensatory strategies: No   General Information: Caregiver present: No  Diet Prior to this Study: NPO   No data recorded  No data recorded  Supplemental O2: Nasal cannula   History of Recent Intubation: Yes  Behavior/Cognition: Alert; Cooperative Self-Feeding Abilities: Needs assist with self-feeding Baseline vocal quality/speech: Aphonic; Dysphonic Volitional Cough: Unable to elicit No data recorded Exam Limitations: -- (Unable to follow directions for compensatory strategies) Goal Planning: Prognosis for improved oropharyngeal function: Guarded No  data recorded No data recorded No data recorded Consulted and agree with results and recommendations: Patient; Nurse Pain: Pain Assessment Breathing: 0  Negative Vocalization: 0 Facial Expression: 0 Body Language: 0 Consolability: 0 PAINAD Score: 0 End of Session: Start Time:SLP Start Time (ACUTE ONLY): 1009 Stop Time: SLP Stop Time (ACUTE ONLY): 1021 Time Calculation:SLP Time Calculation (min) (ACUTE ONLY): 12 min Charges: SLP Evaluations $ SLP Speech Visit: 1 Visit SLP Evaluations $MBS Swallow: 1 Procedure $Swallowing Treatment: 1 Procedure SLP visit diagnosis: SLP Visit Diagnosis: Dysphagia, oropharyngeal phase (R13.12) Past Medical History: Past Medical History: Diagnosis Date  Asthma   Diabetes mellitus without complication (HCC)   Hypertension  Past Surgical History: Past Surgical History: Procedure Laterality Date  MICROLARYNGOSCOPY WITH LASER Left 10/02/2023  Procedure: DIRECT MICROLARYNGOSCOPY, BRONCHOSCOPY, CO2 LASER EXCISION OF LEFT VOCAL FOLD POLYP/MASS;  Surgeon: Artice Last, MD;  Location: [facility] OR;  Service: ENT;  Laterality: Left;  RIGID BRONCHOSCOPY N/A 10/02/2023  Procedure: Francesco Inks;  Surgeon: Artice Last, MD;  Location: [facility] OR;  Service: ENT;  Laterality: N/A;  TUBAL LIGATION   Elester Grim, MA, CCC-SLP Acute Rehabilitation Services Office: [phone] 10/03/2023, 11:12 AM  DG Chest Port 1 View Result Date: 10/02/2023 CLINICAL DATA:  Acute respiratory failure with hypoxia. EXAM: PORTABLE CHEST 1 VIEW COMPARISON:  Oct 01, 2023. FINDINGS: Stable cardiomediastinal silhouette. Left retrocardiac opacity is noted concerning for pneumonia or atelectasis with small associated effusion. Minimal right basilar subsegmental atelectasis is noted. Endotracheal and feeding tubes are in good position. Right internal jugular catheter is unchanged. IMPRESSION: Left retrocardiac opacity is noted concerning for pneumonia or atelectasis with small pleural effusion. Minimal right basilar subsegmental  atelectasis. Electronically Signed   By: Rosalene Colon M.D.   On: 10/02/2023 14:33   DG CHEST PORT 1 VIEW Result Date: 10/01/2023 CLINICAL DATA:  Shortness of breath EXAM: PORTABLE CHEST 1 VIEW COMPARISON:  Sep 29, 2023 FINDINGS: No change in the position of the right IJ dialysis catheter A feeding tube is in the stomach. There are bilateral basilar infiltrates, atelectasis and small bilateral pleural effusions without significant upper lobe consolidations The heart and mediastinum within normal limits IMPRESSION: Bilateral basilar infiltrates, atelectasis and small bilateral pleural effusions. Electronically Signed   By: Fredrich Jefferson M.D.   On: 10/01/2023 15:13   DG Chest Port 1 View Result Date: 09/29/2023 CLINICAL DATA:  Acute respiratory failure EXAM: PORTABLE CHEST 1 VIEW COMPARISON:  09/26/2023 FINDINGS: Stable cardiomediastinal silhouette. Aortic atherosclerotic calcification. Streaky airspace opacities in the bilateral lower lungs. Retrocardiac consolidation. Question layering bilateral pleural effusions. No pneumothorax. Right IJ CVC tip in the mid SVC. IMPRESSION: Streaky airspace opacities in the bilateral lower lungs and retrocardiac consolidation, atelectasis versus pneumonia. Question layering bilateral pleural effusions. Electronically Signed   By: Rozell Cornet M.D.   On: 09/29/2023 22:43   ECHOCARDIOGRAM COMPLETE Result Date: 09/27/2023    ECHOCARDIOGRAM REPORT   Patient Name:   Lori Gates Date of Exam: 09/27/2023 Medical Rec #:  [id number]        Height:       65.0 in Accession #:    [id number]       Weight:       141.1 lb Date of Birth:  1955/10/28       BSA:          1.706 m Patient Age:    67 years         BP:           152/63 mmHg Patient Gender: F                HR:  82 bpm. Exam Location:  Inpatient Procedure: 2D Echo, 3D Echo, Cardiac Doppler, Color Doppler and Strain Analysis            (Both Spectral and Color Flow Doppler were utilized during            procedure).  Indications:    CHF-acute diastolic  History:        Patient has no prior history of Echocardiogram examinations.  Sonographer:    Juanita Shaw Referring Phys: [id number] SUDHAM CHAND IMPRESSIONS  1. Left ventricular ejection fraction, by estimation, is 65 to 70%. Left ventricular ejection fraction by 3D volume is 60 %. The left ventricle has normal function. The left ventricle has no regional wall motion abnormalities. There is moderate concentric left ventricular hypertrophy. Left ventricular diastolic parameters are indeterminate. The average left ventricular global longitudinal strain is -21.2 %. The global longitudinal strain is normal.  2. Right ventricular systolic function is normal. The right ventricular size is normal. There is mildly elevated pulmonary artery systolic pressure.  3. Left atrial size was mild to moderately dilated.  4. The mitral valve is degenerative. Mild mitral valve regurgitation. No evidence of mitral stenosis.  5. The aortic valve is normal in structure. There is mild calcification of the aortic valve. Aortic valve regurgitation is mild. No aortic stenosis is present.  6. The inferior vena cava is normal in size with greater than 50% respiratory variability, suggesting right atrial pressure of 3 mmHg. FINDINGS  Left Ventricle: Left ventricular ejection fraction, by estimation, is 65 to 70%. Left ventricular ejection fraction by 3D volume is 60 %. The left ventricle has normal function. The left ventricle has no regional wall motion abnormalities. The average left ventricular global longitudinal strain is -21.2 %. Strain was performed and the global longitudinal strain is normal. The left ventricular internal cavity size was normal in size. There is moderate concentric left ventricular hypertrophy. Left ventricular diastolic parameters are indeterminate. Right Ventricle: The right ventricular size is normal. No increase in right ventricular wall thickness. Right ventricular systolic  function is normal. There is mildly elevated pulmonary artery systolic pressure. The tricuspid regurgitant velocity is 3.02  m/s, and with an assumed right atrial pressure of 8 mmHg, the estimated right ventricular systolic pressure is 44.5 mmHg. Left Atrium: Left atrial size was mild to moderately dilated. Right Atrium: Right atrial size was normal in size. Pericardium: There is no evidence of pericardial effusion. Mitral Valve: The mitral valve is degenerative in appearance. Mild mitral annular calcification. Mild mitral valve regurgitation. No evidence of mitral valve stenosis. MV peak gradient, 3.7 mmHg. The mean mitral valve gradient is 2.0 mmHg. Tricuspid Valve: The tricuspid valve is normal in structure. Tricuspid valve regurgitation is mild . No evidence of tricuspid stenosis. Aortic Valve: The aortic valve is normal in structure. There is mild calcification of the aortic valve. Aortic valve regurgitation is mild. No aortic stenosis is present. Aortic valve mean gradient measures 2.0 mmHg. Aortic valve peak gradient measures 3.5 mmHg. Aortic valve area, by VTI measures 4.76 cm. Pulmonic Valve: The pulmonic valve was normal in structure. Pulmonic valve regurgitation is trivial. No evidence of pulmonic stenosis. Aorta: The aortic root is normal in size and structure. Venous: The inferior vena cava is normal in size with greater than 50% respiratory variability, suggesting right atrial pressure of 3 mmHg. IAS/Shunts: No atrial level shunt detected by color flow Doppler. Additional Comments: 3D was performed not requiring image post processing on an independent workstation and was normal.  LEFT VENTRICLE PLAX 2D LVIDd:         3.00 cm         Diastology LVIDs:         2.00 cm         LV e' medial:    4.82 cm/s LV PW:         0.80 cm         LV E/e' medial:  18.2 LV IVS:        1.20 cm         LV e' lateral:   8.59 cm/s LVOT diam:     2.10 cm         LV E/e' lateral: 10.2 LV SV:         72 LV SV Index:   42               2D Longitudinal LVOT Area:     3.46 cm        Strain                                2D Strain GLS   -21.2 %                                Avg: LV Volumes (MOD) LV vol d, MOD    89.1 ml       3D Volume EF A2C:                           LV 3D EF:    Left LV vol d, MOD    93.6 ml                    ventricul A4C:                                        ar LV vol s, MOD    32.9 ml                    ejection A2C:                                        fraction LV vol s, MOD    32.3 ml                    by 3D A4C:                                        volume is LV SV MOD A2C:   56.2 ml                    60 %. LV SV MOD A4C:   93.6 ml LV SV MOD BP:    64.6 ml                                3D Volume EF:  3D EF:        60 %                                LV EDV:       122 ml                                LV ESV:       49 ml                                LV SV:        73 ml RIGHT VENTRICLE             IVC RV Basal diam:  3.50 cm     IVC diam: 1.60 cm RV Mid diam:    2.30 cm RV S prime:     14.40 cm/s TAPSE (M-mode): 2.5 cm LEFT ATRIUM           Index        RIGHT ATRIUM           Index LA diam:      3.20 cm 1.88 cm/m   RA Area:     12.40 cm LA Vol (A2C): 52.9 ml 31.02 ml/m  RA Volume:   28.30 ml  16.59 ml/m LA Vol (A4C): 53.6 ml 31.43 ml/m  AORTIC VALVE                    PULMONIC VALVE AV Area (Vmax):    3.75 cm     PV Vmax:       0.61 m/s AV Area (Vmean):   3.70 cm     PV Peak grad:  1.5 mmHg AV Area (VTI):     4.76 cm AV Vmax:           94.10 cm/s AV Vmean:          65.600 cm/s AV VTI:            0.152 m AV Peak Grad:      3.5 mmHg AV Mean Grad:      2.0 mmHg LVOT Vmax:         102.00 cm/s LVOT Vmean:        70.100 cm/s LVOT VTI:          0.209 m LVOT/AV VTI ratio: 1.38  AORTA Ao Root diam: 3.30 cm Ao Asc diam:  3.20 cm MITRAL VALVE               TRICUSPID VALVE MV Area (PHT): 4.63 cm    TR Peak grad:   36.5 mmHg MV Area VTI:   4.52 cm    TR Vmax:        302.00 cm/s  MV Peak grad:  3.7 mmHg MV Mean grad:  2.0 mmHg    SHUNTS MV Vmax:       0.96 m/s    Systemic VTI:  0.21 m MV Vmean:      60.0 cm/s   Systemic Diam: 2.10 cm MV Decel Time: 164 msec MV E velocity: 87.80 cm/s MV A velocity: 76.40 cm/s MV E/A ratio:  1.15 Jules Oar MD Electronically signed by Jules Oar MD Signature Date/Time: 09/27/2023/11:34:55 AM    Final    DG CHEST PORT 1 VIEW Result  Date: 09/26/2023 CLINICAL DATA:  Central line placement. EXAM: PORTABLE CHEST 1 VIEW COMPARISON:  September 25, 2023. FINDINGS: Stable cardiomediastinal silhouette. Endotracheal and nasogastric tubes are unchanged. Left lung is clear. Interval placement of right internal jugular catheter with distal tip in expected position of the SVC. Stable right basilar atelectasis or infiltrate is noted. Bony thorax is unremarkable. IMPRESSION: Interval placement of right internal jugular catheter with distal tip in expected position of the SVC. No pneumothorax. Stable right basilar opacity is noted above. Electronically Signed   By: Rosalene Colon M.D.   On: 09/26/2023 10:12   VAS US  UPPER EXTREMITY VENOUS DUPLEX Result Date: 09/25/2023 UPPER VENOUS STUDY  Patient Name:  Lori Gates  Date of Exam:   09/25/2023 Medical Rec #: [id number]         Accession #:    [id number] Date of Birth: Nov 04, 1955        Patient Gender: F Patient Age:   58 years Exam Location:  [facility] Procedure:      VAS US  UPPER EXTREMITY VENOUS DUPLEX Referring Phys: Autry Legions PAYNE --------------------------------------------------------------------------------  Indications: Swelling Risk Factors: Immobility past pregnancy. Limitations: Line. Comparison Study: None. Performing Technologist: Estanislao Heimlich  Examination Guidelines: A complete evaluation includes B-mode imaging, spectral Doppler, color Doppler, and power Doppler as needed of all accessible portions of each vessel. Bilateral testing is considered an integral part of a complete examination.  Limited examinations for reoccurring indications may be performed as noted.  Right Findings: +----------+------------+---------+-----------+----------+-------+ RIGHT     CompressiblePhasicitySpontaneousPropertiesSummary +----------+------------+---------+-----------+----------+-------+ Subclavian    Full       Yes       Yes                      +----------+------------+---------+-----------+----------+-------+  Left Findings: +----------+------------+---------+-----------+------------------+----------+ LEFT      CompressiblePhasicitySpontaneous    Properties     Summary   +----------+------------+---------+-----------+------------------+----------+ IJV                                                         Obstructed +----------+------------+---------+-----------+------------------+----------+ Subclavian    Full       Yes       Yes                                 +----------+------------+---------+-----------+------------------+----------+ Axillary      Full       Yes       Yes                                 +----------+------------+---------+-----------+------------------+----------+ Brachial      Full       Yes       Yes                                 +----------+------------+---------+-----------+------------------+----------+ Radial        Full       No        Yes                                 +----------+------------+---------+-----------+------------------+----------+  Ulnar         Full       No        Yes                                 +----------+------------+---------+-----------+------------------+----------+ Basilic       None       No        No     brightly echogenic  Acute    +----------+------------+---------+-----------+------------------+----------+ Left Brachial-Cephalic AVF is patent  Summary:  Right: No evidence of thrombosis in the subclavian.  Left: No evidence of deep vein thrombosis in the upper extremity. Findings  consistent with acute superficial vein thrombosis involving the left basilic vein.  *See table(s) above for measurements and observations.  Diagnosing physician: Genny Kid MD Electronically signed by Genny Kid MD on 09/25/2023 at 8:31:48 PM.    Final    EEG adult Result Date: 09/25/2023 Arleene Lack, MD     09/25/2023 12:43 PM Patient Name: Lori Gates MRN: [id number] Epilepsy Attending: Arleene Lack Referring Physician/Provider: Kimberley Penman, MD Date: 09/25/2023 Duration: 23.47 mins Patient history: 68 year old female transferred [facility] as a telestroke but with more concern for seizure in the setting of severe hyperglycemia then stroke. Level of alertness:  comatose AEDs during EEG study: LEV, propofol  Technical aspects: This EEG study was done with scalp electrodes positioned according to the 10-20 International system of electrode placement. Electrical activity was reviewed with band pass filter of 1-70Hz , sensitivity of 7 uV/mm, display speed of 58mm/sec with a 60Hz  notched filter applied as appropriate. EEG data were recorded continuously and digitally stored.  Video monitoring was available and reviewed as appropriate. Description: EEG showed continuous generalized 3 to 5 Hz theta-delta slowing. Hyperventilation and photic stimulation were not performed.   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 No seizures or epileptiform discharges were seen throughout the recording. Arleene Lack   DG Chest Port 1 View Result Date: 09/25/2023 CLINICAL DATA:  Respiratory failure EXAM: PORTABLE CHEST 1 VIEW COMPARISON:  09/24/2023 FINDINGS: The endotracheal tube is seen 4.7 cm above the carina. Nasogastric tube extends into the upper abdomen beyond the margin of the examination. Progressive right basilar pulmonary infiltrate. Small associated right pleural effusion suspected. Left lung is  clear. No pneumothorax. No pleural effusion on left. Cardiac size within normal limits. Pulmonary vascularity is normal. No acute bone abnormality. IMPRESSION: 1. Support tubes in appropriate position. 2. Progressive right basilar pulmonary infiltrate. Small associated right pleural effusion suspected. Electronically Signed   By: Worthy Heads M.D.   On: 09/25/2023 00:36   CT Maxillofacial Wo Contrast Result Date: 09/24/2023 CLINICAL DATA:  Found down, seizure, facial swelling on earlier CT EXAM: CT MAXILLOFACIAL WITHOUT CONTRAST TECHNIQUE: Multidetector CT imaging of the maxillofacial structures was performed. Multiplanar CT image reconstructions were also generated. RADIATION DOSE REDUCTION: This exam was performed according to the departmental dose-optimization program which includes automated exposure control, adjustment of the mA and/or kV according to patient size and/or use of iterative reconstruction technique. COMPARISON:  09/24/2023 FINDINGS: Osseous: No fracture or mandibular dislocation. No destructive process. Orbits: Negative. No traumatic or inflammatory finding. Sinuses: Small gas fluid level left maxillary sinus. Mild mucosal thickening within the sphenoid sinus. Soft tissues: Left periorbital soft tissue swelling. Subcutaneous edema is seen along the left side mandible and extending  into the left neck. Limited intracranial: No significant or unexpected finding. IMPRESSION: 1. Left periorbital soft tissue swelling, as well as subcutaneous edema along the left submandibular region and left side neck. 2. No acute facial bone fracture. 3. Minimal left maxillary and sphenoid sinus disease. Electronically Signed   By: Bobbye Burrow M.D.   On: 09/24/2023 19:15   CT Cervical Spine Wo Contrast Result Date: 09/24/2023 CLINICAL DATA:  Left periorbital soft tissue swelling, facial trauma, found down, possible seizure EXAM: CT CERVICAL SPINE WITHOUT CONTRAST TECHNIQUE: Multidetector CT imaging of the  cervical spine was performed without intravenous contrast. Multiplanar CT image reconstructions were also generated. RADIATION DOSE REDUCTION: This exam was performed according to the departmental dose-optimization program which includes automated exposure control, adjustment of the mA and/or kV according to patient size and/or use of iterative reconstruction technique. COMPARISON:  None Available. FINDINGS: Alignment: Alignment is grossly anatomic. Skull base and vertebrae: No acute fracture. No primary bone lesion or focal pathologic process. Soft tissues and spinal canal: No prevertebral fluid or swelling. No visible canal hematoma. Disc levels: Multilevel spondylosis and facet hypertrophy, most pronounced from C4-5 through C6-7. Circumferential disc bulge identified at C3-4, C4-5, C5-6, and C6-7. Changes are eccentric to the right at C3-4 and C4-5 with right greater than left neural foraminal narrowing. There is mild-to-moderate central canal stenosis at C4-5 and C5-6. Upper chest: Endotracheal and enteric catheters are partially visualized, distal margins excluded by slice selection. Lung apices are clear. Other: Reconstructed images demonstrate no additional findings. IMPRESSION: 1. No acute cervical spine fracture. 2. Extensive cervical spondylosis and facet hypertrophy as above. Electronically Signed   By: Bobbye Burrow M.D.   On: 09/24/2023 19:12   MR BRAIN WO CONTRAST Result Date: 09/24/2023 CLINICAL DATA:  Mental status change EXAM: MRI HEAD WITHOUT CONTRAST TECHNIQUE: Multiplanar, multiecho pulse sequences of the brain and surrounding structures were obtained without intravenous contrast. COMPARISON:  Earlier same day head CT. FINDINGS: Brain: Punctate focus of mild diffusion signal abnormality and ADC signal abnormality in the subcortical white matter involving the left postcentral gyrus concerning for acute/early subacute infarct. No additional region of infarct noted. No evidence of intracranial  hemorrhage. Mild T2/FLAIR signal abnormality in the periventricular and subcortical white matter. Mild parenchymal volume loss. No mass lesion or midline shift. Cerebellum is unremarkable. Normal appearance of midline structures. The basilar cisterns are patent. No extra-axial fluid collections. Ventricles: Prominence of the lateral ventricles suggestive of underlying parenchymal volume loss. Vascular: Skull base flow voids are visualized. Skull and upper cervical spine: Degenerative changes in the visualized upper cervical spine. Disc osteophyte complex at C3-4 indents the ventral thecal sac. Visualized calvarium is unremarkable. Sinuses/Orbits: Orbits are symmetric. Mild mucosal thickening throughout the paranasal sinuses. Additional more pronounced mucosal thickening in the posterior and inferior left maxillary sinus. Other: Right mastoid effusion. Partially visualized left facial soft tissue swelling. 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 Mild chronic microvascular ischemic changes and mild parenchymal volume loss. Paranasal sinus disease most pronounced in the left maxillary sinus. Right mastoid effusion. Partially visualized left facial soft tissue swelling. Electronically Signed   By: Denny Flack M.D.   On: 09/24/2023 17:54   DG Chest Portable 1 View Result Date: 09/24/2023 CLINICAL DATA:  et tube and og placement EXAM: PORTABLE CHEST - 1 VIEW COMPARISON:  September 24, 2023 4:14 p.m. FINDINGS: Endotracheal tube terminates in the mid trachea. Esophagogastric tube courses below the diaphragm terminating  in the stomach. Elevation of the right hemidiaphragm. Streaky right basilar atelectasis. No focal airspace consolidation, pleural effusion, or pneumothorax. No cardiomegaly. Tortuous aorta with aortic atherosclerosis. No acute fracture or destructive lesion. Likely external jugular IV in the left neck. IMPRESSION: 1.  Well-positioned endotracheal and esophagogastric tubes, as delineated above. No pneumothorax. 2. No significant interval change to the lungs. Electronically Signed   By: Rance Burrows M.D.   On: 09/24/2023 17:17   DG Chest Portable 1 View Result Date: 09/24/2023 CLINICAL DATA:  Found down, unresponsive, seizure EXAM: PORTABLE CHEST 1 VIEW COMPARISON:  07/18/2023 FINDINGS: Single frontal view of the chest demonstrates an unremarkable cardiac silhouette. Stable ectasia and atherosclerosis of the thoracic aorta. No acute airspace disease, effusion, or pneumothorax. No acute displaced fractures. IMPRESSION: 1. Stable chest, no acute process. Electronically Signed   By: Bobbye Burrow M.D.   On: 09/24/2023 16:41   CT HEAD CODE STROKE WO CONTRAST Result Date: 09/24/2023 CLINICAL DATA:  Code stroke. Neuro deficit, concern for stroke, found lying on floor this afternoon unresponsive possible seizure. EXAM: CT HEAD WITHOUT CONTRAST TECHNIQUE: Contiguous axial images were obtained from the base of the skull through the vertex without intravenous contrast. RADIATION DOSE REDUCTION: This exam was performed according to the departmental dose-optimization program which includes automated exposure control, adjustment of the mA and/or kV according to patient size and/or use of iterative reconstruction technique. COMPARISON:  None Available. FINDINGS: Brain: No acute intracranial hemorrhage. No CT evidence of acute infarct. No edema, mass effect, or midline shift. The basilar cisterns are patent. Ventricles: The ventricles are normal. Vascular: Atherosclerotic calcifications of the carotid siphons. No hyperdense vessel. Skull: No acute or aggressive finding. Orbits: Orbits are symmetric. Sinuses: Mucosal thickening and air-fluid level in the left maxillary sinus. Other: Right mastoid effusion. Left periorbital soft tissue swelling and left facial soft tissue swelling over the maxillary sinus and zygomatic arch. ASPECTS  [facility] Stroke Program Early CT Score) - Ganglionic level infarction (caudate, lentiform nuclei, internal capsule, insula, M1-M3 cortex): 7 - Supraganglionic infarction (M4-M6 cortex): 3 Total score (0-10 with 10 being normal): 10 IMPRESSION: 1. No CT evidence of acute intracranial abnormality. 2. Left periorbital and left facial soft tissue swelling is partially visualized. Consider CT maxillofacial for further evaluation of facial trauma if clinically indicated. 3. Right mastoid effusion. Recommend correlation with tenderness over the mastoid temporal bone. 4. ASPECTS is 10 These results were called by telephone at the time of interpretation on 09/24/2023 at 4:02 pm to provider Dr. Isaiah Marc, who verbally acknowledged these results. Electronically Signed   By: Denny Flack M.D.   On: 09/24/2023 16:07    Subjective: Patient was seen and examined at bedside.  Overnight events noted. Patient is at her baseline mental status.  She states she wants to be discharged.  Discharge Exam: Vitals:   10/19/23 0454 10/19/23 0802  BP: (!) 123/57 (!) 142/64  Pulse: 67 67  Resp: 20   Temp: 98 F (36.7 C) 97.8 F (36.6 C)  SpO2: 96% 96%   Vitals:   10/18/23 1738 10/18/23 1940 10/19/23 0454 10/19/23 0802  BP: (!) 140/78 135/62 (!) 123/57 (!) 142/64  Pulse: 69 65 67 67  Resp:  20 20   Temp: (!) 97.5 F (36.4 C) 98.1 F (36.7 C) 98 F (36.7 C) 97.8 F (36.6 C)  TempSrc:  Oral Oral   SpO2: 98% 97% 96% 96%  Weight:   66.4 kg   Height:        General:  Pt is alert, awake, not in acute distress Cardiovascular: RRR, S1/S2 +, no rubs, no gallops Respiratory: CTA bilaterally, no wheezing, no rhonchi Abdominal: Soft, NT, ND, bowel sounds + Extremities: no edema, no cyanosis    The results of significant diagnostics from this hospitalization (including imaging, microbiology, ancillary and laboratory) are listed below for reference.     Microbiology: No results found for this or any previous visit  (from the past 240 hours).   Labs: BNP (last 3 results) No results for input(s): "BNP" in the last 8760 hours. Basic Metabolic Panel: Recent Labs  Lab 10/13/23 0949 10/14/23 0517 10/15/23 0848 10/18/23 0737 10/19/23 0812  NA 141 141 145 142 141  K 4.8 5.0 4.7 4.6 5.3*  CL 109 110 109 109 109  CO2 23 25 26 24  21*  GLUCOSE 187* 174* 132* 87 192*  BUN 65* 73* 75* 85* 86*  CREATININE 2.93* 3.07* 3.09* 3.29* 3.44*  CALCIUM  8.1* 8.2* 8.8* 8.6* 8.6*  MG  --  1.9 2.1 2.2 2.2  PHOS  --  5.0* 5.4* 6.4* 6.3*   Liver Function Tests: Recent Labs  Lab 10/14/23 0517 10/15/23 0848  AST 103* 67*  ALT 71* 73*  ALKPHOS 264* 323*  BILITOT 0.5 0.6  PROT 5.6* 6.8  ALBUMIN 2.1* 2.6*   No results for input(s): "LIPASE", "AMYLASE" in the last 168 hours. No results for input(s): "AMMONIA" in the last 168 hours. CBC: Recent Labs  Lab 10/13/23 0949 10/14/23 0517 10/14/23 0822 10/14/23 1626 10/15/23 0848 10/18/23 0737 10/18/23 1332  WBC 5.1 5.2 5.5  --  4.9 4.9  --   NEUTROABS 2.8 2.9  --   --  2.5  --   --   HGB 7.0* 6.5* 7.6* 7.2* 7.9* 7.2* 7.4*  HCT 22.8* 20.8* 24.3* 23.7* 25.5* 23.6* 23.6*  MCV 98.7 99.0 99.2  --  99.2 99.6  --   PLT 228 211 240  --  263 227  --    Cardiac Enzymes: No results for input(s): "CKTOTAL", "CKMB", "CKMBINDEX", "TROPONINI" in the last 168 hours. BNP: Invalid input(s): "POCBNP" CBG: Recent Labs  Lab 10/18/23 0859 10/18/23 1253 10/18/23 1728 10/18/23 1939 10/19/23 0801  GLUCAP 113* 254* 212* 141* 177*   D-Dimer No results for input(s): "DDIMER" in the last 72 hours. Hgb A1c No results for input(s): "HGBA1C" in the last 72 hours. Lipid Profile No results for input(s): "CHOL", "HDL", "LDLCALC", "TRIG", "CHOLHDL", "LDLDIRECT" in the last 72 hours. Thyroid function studies No results for input(s): "TSH", "T4TOTAL", "T3FREE", "THYROIDAB" in the last 72 hours.  Invalid input(s): "FREET3" Anemia work up No results for input(s): "VITAMINB12",  "FOLATE", "FERRITIN", "TIBC", "IRON", "RETICCTPCT" in the last 72 hours. Urinalysis    Component Value Date/Time   COLORURINE YELLOW 10/14/2023 1009   APPEARANCEUR CLOUDY (A) 10/14/2023 1009   LABSPEC 1.017 10/14/2023 1009   PHURINE 5.0 10/14/2023 1009   GLUCOSEU NEGATIVE 10/14/2023 1009   HGBUR NEGATIVE 10/14/2023 1009   BILIRUBINUR NEGATIVE 10/14/2023 1009   KETONESUR NEGATIVE 10/14/2023 1009   PROTEINUR >=300 (A) 10/14/2023 1009   NITRITE NEGATIVE 10/14/2023 1009   LEUKOCYTESUR MODERATE (A) 10/14/2023 1009   Sepsis Labs Recent Labs  Lab 10/14/23 0517 10/14/23 0822 10/15/23 0848 10/18/23 0737  WBC 5.2 5.5 4.9 4.9   Microbiology No results found for this or any previous visit (from the past 240 hours).   Time coordinating discharge: Over 30 minutes  SIGNED:   Magdalene School, MD  [facility] 10/19/2023, 11:23 AM Pager  If 7PM-7AM, please contact night-coverage

## 2023-10-19 NOTE — Plan of Care (Signed)
  Problem: Education: Goal: Knowledge of General Education information will improve Description: Including pain rating scale, medication(s)/side effects and non-pharmacologic comfort measures Outcome: Progressing   Problem: Health Behavior/Discharge Planning: Goal: Ability to manage health-related needs will improve Outcome: Progressing   Problem: Clinical Measurements: Goal: Ability to maintain clinical measurements within normal limits will improve Outcome: Progressing Goal: Will remain free from infection Outcome: Progressing Goal: Diagnostic test results will improve Outcome: Progressing Goal: Respiratory complications will improve Outcome: Progressing Goal: Cardiovascular complication will be avoided Outcome: Progressing   Problem: Activity: Goal: Risk for activity intolerance will decrease Outcome: Progressing   Problem: Nutrition: Goal: Adequate nutrition will be maintained Outcome: Progressing   Problem: Coping: Goal: Level of anxiety will decrease Outcome: Progressing   Problem: Elimination: Goal: Will not experience complications related to bowel motility Outcome: Progressing Goal: Will not experience complications related to urinary retention Outcome: Progressing   Problem: Pain Managment: Goal: General experience of comfort will improve and/or be controlled Outcome: Progressing   Problem: Safety: Goal: Ability to remain free from injury will improve Outcome: Progressing   Problem: Skin Integrity: Goal: Risk for impaired skin integrity will decrease Outcome: Progressing   Problem: Education: Goal: Ability to describe self-care measures that may prevent or decrease complications (Diabetes Survival Skills Education) will improve Outcome: Progressing Goal: Individualized Educational Video(s) Outcome: Progressing   Problem: Coping: Goal: Ability to adjust to condition or change in health will improve Outcome: Progressing   Problem: Fluid  Volume: Goal: Ability to maintain a balanced intake and output will improve Outcome: Progressing   Problem: Health Behavior/Discharge Planning: Goal: Ability to identify and utilize available resources and services will improve Outcome: Progressing Goal: Ability to manage health-related needs will improve Outcome: Progressing   Problem: Metabolic: Goal: Ability to maintain appropriate glucose levels will improve Outcome: Progressing   Problem: Nutritional: Goal: Maintenance of adequate nutrition will improve Outcome: Progressing Goal: Progress toward achieving an optimal weight will improve Outcome: Progressing   Problem: Skin Integrity: Goal: Risk for impaired skin integrity will decrease Outcome: Progressing   Problem: Tissue Perfusion: Goal: Adequacy of tissue perfusion will improve Outcome: Progressing   Problem: Activity: Goal: Ability to tolerate increased activity will improve Outcome: Progressing   Problem: Respiratory: Goal: Ability to maintain a clear airway and adequate ventilation will improve Outcome: Progressing   Problem: Role Relationship: Goal: Method of communication will improve Outcome: Progressing   Problem: Education: Goal: Ability to describe self-care measures that may prevent or decrease complications (Diabetes Survival Skills Education) will improve Outcome: Progressing Goal: Individualized Educational Video(s) Outcome: Progressing   Problem: Cardiac: Goal: Ability to maintain an adequate cardiac output will improve Outcome: Progressing   Problem: Health Behavior/Discharge Planning: Goal: Ability to identify and utilize available resources and services will improve Outcome: Progressing Goal: Ability to manage health-related needs will improve Outcome: Progressing   Problem: Fluid Volume: Goal: Ability to achieve a balanced intake and output will improve Outcome: Progressing   Problem: Metabolic: Goal: Ability to maintain  appropriate glucose levels will improve Outcome: Progressing   Problem: Nutritional: Goal: Maintenance of adequate nutrition will improve Outcome: Progressing Goal: Maintenance of adequate weight for body size and type will improve Outcome: Progressing   Problem: Respiratory: Goal: Will regain and/or maintain adequate ventilation Outcome: Progressing   Problem: Urinary Elimination: Goal: Ability to achieve and maintain adequate renal perfusion and functioning will improve Outcome: Progressing

## 2023-10-19 NOTE — Discharge Instructions (Signed)
 Advised to follow-up with primary care physician in 1 week. Advised to take torsemide  20 mg daily. Advised to follow-up with nephrology as scheduled.

## 2023-10-22 DIAGNOSIS — R41841 Cognitive communication deficit: Secondary | ICD-10-CM | POA: Diagnosis not present

## 2023-10-22 DIAGNOSIS — J3800 Paralysis of vocal cords and larynx, unspecified: Secondary | ICD-10-CM | POA: Diagnosis not present

## 2023-10-22 DIAGNOSIS — J381 Polyp of vocal cord and larynx: Secondary | ICD-10-CM | POA: Diagnosis not present

## 2023-10-22 DIAGNOSIS — G4089 Other seizures: Secondary | ICD-10-CM | POA: Diagnosis not present

## 2023-10-22 DIAGNOSIS — M6281 Muscle weakness (generalized): Secondary | ICD-10-CM | POA: Diagnosis not present

## 2023-10-22 DIAGNOSIS — E43 Unspecified severe protein-calorie malnutrition: Secondary | ICD-10-CM | POA: Diagnosis not present

## 2023-10-22 DIAGNOSIS — R1312 Dysphagia, oropharyngeal phase: Secondary | ICD-10-CM | POA: Diagnosis not present

## 2023-10-22 DIAGNOSIS — G9341 Metabolic encephalopathy: Secondary | ICD-10-CM | POA: Diagnosis not present

## 2023-10-22 DIAGNOSIS — E1110 Type 2 diabetes mellitus with ketoacidosis without coma: Secondary | ICD-10-CM | POA: Diagnosis not present

## 2023-10-23 DIAGNOSIS — J3800 Paralysis of vocal cords and larynx, unspecified: Secondary | ICD-10-CM | POA: Diagnosis not present

## 2023-10-23 DIAGNOSIS — G9341 Metabolic encephalopathy: Secondary | ICD-10-CM | POA: Diagnosis not present

## 2023-10-23 DIAGNOSIS — G4089 Other seizures: Secondary | ICD-10-CM | POA: Diagnosis not present

## 2023-10-23 DIAGNOSIS — E43 Unspecified severe protein-calorie malnutrition: Secondary | ICD-10-CM | POA: Diagnosis not present

## 2023-10-23 DIAGNOSIS — J381 Polyp of vocal cord and larynx: Secondary | ICD-10-CM | POA: Diagnosis not present

## 2023-10-23 DIAGNOSIS — R1312 Dysphagia, oropharyngeal phase: Secondary | ICD-10-CM | POA: Diagnosis not present

## 2023-10-23 DIAGNOSIS — E1110 Type 2 diabetes mellitus with ketoacidosis without coma: Secondary | ICD-10-CM | POA: Diagnosis not present

## 2023-10-23 DIAGNOSIS — M6281 Muscle weakness (generalized): Secondary | ICD-10-CM | POA: Diagnosis not present

## 2023-10-23 DIAGNOSIS — R41841 Cognitive communication deficit: Secondary | ICD-10-CM | POA: Diagnosis not present

## 2023-10-25 DIAGNOSIS — M6281 Muscle weakness (generalized): Secondary | ICD-10-CM | POA: Diagnosis not present

## 2023-10-25 DIAGNOSIS — J3800 Paralysis of vocal cords and larynx, unspecified: Secondary | ICD-10-CM | POA: Diagnosis not present

## 2023-10-25 DIAGNOSIS — E43 Unspecified severe protein-calorie malnutrition: Secondary | ICD-10-CM | POA: Diagnosis not present

## 2023-10-25 DIAGNOSIS — G4089 Other seizures: Secondary | ICD-10-CM | POA: Diagnosis not present

## 2023-10-25 DIAGNOSIS — J381 Polyp of vocal cord and larynx: Secondary | ICD-10-CM | POA: Diagnosis not present

## 2023-10-25 DIAGNOSIS — R41841 Cognitive communication deficit: Secondary | ICD-10-CM | POA: Diagnosis not present

## 2023-10-25 DIAGNOSIS — G9341 Metabolic encephalopathy: Secondary | ICD-10-CM | POA: Diagnosis not present

## 2023-10-25 DIAGNOSIS — R1312 Dysphagia, oropharyngeal phase: Secondary | ICD-10-CM | POA: Diagnosis not present

## 2023-10-25 DIAGNOSIS — E1110 Type 2 diabetes mellitus with ketoacidosis without coma: Secondary | ICD-10-CM | POA: Diagnosis not present

## 2023-10-28 DIAGNOSIS — J3800 Paralysis of vocal cords and larynx, unspecified: Secondary | ICD-10-CM | POA: Diagnosis not present

## 2023-10-28 DIAGNOSIS — R41841 Cognitive communication deficit: Secondary | ICD-10-CM | POA: Diagnosis not present

## 2023-10-28 DIAGNOSIS — E1110 Type 2 diabetes mellitus with ketoacidosis without coma: Secondary | ICD-10-CM | POA: Diagnosis not present

## 2023-10-28 DIAGNOSIS — G4089 Other seizures: Secondary | ICD-10-CM | POA: Diagnosis not present

## 2023-10-28 DIAGNOSIS — R1312 Dysphagia, oropharyngeal phase: Secondary | ICD-10-CM | POA: Diagnosis not present

## 2023-10-28 DIAGNOSIS — M6281 Muscle weakness (generalized): Secondary | ICD-10-CM | POA: Diagnosis not present

## 2023-10-28 DIAGNOSIS — J381 Polyp of vocal cord and larynx: Secondary | ICD-10-CM | POA: Diagnosis not present

## 2023-10-28 DIAGNOSIS — G9341 Metabolic encephalopathy: Secondary | ICD-10-CM | POA: Diagnosis not present

## 2023-10-28 DIAGNOSIS — E43 Unspecified severe protein-calorie malnutrition: Secondary | ICD-10-CM | POA: Diagnosis not present

## 2023-10-30 DIAGNOSIS — M6281 Muscle weakness (generalized): Secondary | ICD-10-CM | POA: Diagnosis not present

## 2023-10-30 DIAGNOSIS — R1312 Dysphagia, oropharyngeal phase: Secondary | ICD-10-CM | POA: Diagnosis not present

## 2023-10-30 DIAGNOSIS — G9341 Metabolic encephalopathy: Secondary | ICD-10-CM | POA: Diagnosis not present

## 2023-10-30 DIAGNOSIS — E43 Unspecified severe protein-calorie malnutrition: Secondary | ICD-10-CM | POA: Diagnosis not present

## 2023-10-30 DIAGNOSIS — R41841 Cognitive communication deficit: Secondary | ICD-10-CM | POA: Diagnosis not present

## 2023-10-30 DIAGNOSIS — J381 Polyp of vocal cord and larynx: Secondary | ICD-10-CM | POA: Diagnosis not present

## 2023-10-30 DIAGNOSIS — J3800 Paralysis of vocal cords and larynx, unspecified: Secondary | ICD-10-CM | POA: Diagnosis not present

## 2023-10-30 DIAGNOSIS — E1110 Type 2 diabetes mellitus with ketoacidosis without coma: Secondary | ICD-10-CM | POA: Diagnosis not present

## 2023-10-30 DIAGNOSIS — G4089 Other seizures: Secondary | ICD-10-CM | POA: Diagnosis not present

## 2023-10-31 DIAGNOSIS — E1110 Type 2 diabetes mellitus with ketoacidosis without coma: Secondary | ICD-10-CM | POA: Diagnosis not present

## 2023-10-31 DIAGNOSIS — G9341 Metabolic encephalopathy: Secondary | ICD-10-CM | POA: Diagnosis not present

## 2023-10-31 DIAGNOSIS — J381 Polyp of vocal cord and larynx: Secondary | ICD-10-CM | POA: Diagnosis not present

## 2023-10-31 DIAGNOSIS — J3800 Paralysis of vocal cords and larynx, unspecified: Secondary | ICD-10-CM | POA: Diagnosis not present

## 2023-10-31 DIAGNOSIS — R41841 Cognitive communication deficit: Secondary | ICD-10-CM | POA: Diagnosis not present

## 2023-10-31 DIAGNOSIS — R1312 Dysphagia, oropharyngeal phase: Secondary | ICD-10-CM | POA: Diagnosis not present

## 2023-10-31 DIAGNOSIS — E43 Unspecified severe protein-calorie malnutrition: Secondary | ICD-10-CM | POA: Diagnosis not present

## 2023-10-31 DIAGNOSIS — M6281 Muscle weakness (generalized): Secondary | ICD-10-CM | POA: Diagnosis not present

## 2023-10-31 DIAGNOSIS — G4089 Other seizures: Secondary | ICD-10-CM | POA: Diagnosis not present

## 2023-11-26 DEATH — deceased
